# Patient Record
Sex: Male | Born: 1945 | Race: Black or African American | Hispanic: No | Smoking: Former smoker
Health system: Southern US, Community
[De-identification: ages and names within clinical notes are randomized; demographics above are authoritative.]

## PROBLEM LIST (undated history)

## (undated) DIAGNOSIS — E669 Obesity, unspecified: Secondary | ICD-10-CM

## (undated) DIAGNOSIS — I10 Essential (primary) hypertension: Secondary | ICD-10-CM

## (undated) DIAGNOSIS — G5690 Unspecified mononeuropathy of unspecified upper limb: Secondary | ICD-10-CM

## (undated) DIAGNOSIS — M1990 Unspecified osteoarthritis, unspecified site: Secondary | ICD-10-CM

## (undated) DIAGNOSIS — D126 Benign neoplasm of colon, unspecified: Secondary | ICD-10-CM

## (undated) DIAGNOSIS — L719 Rosacea, unspecified: Secondary | ICD-10-CM

## (undated) DIAGNOSIS — I493 Ventricular premature depolarization: Secondary | ICD-10-CM

## (undated) DIAGNOSIS — I428 Other cardiomyopathies: Secondary | ICD-10-CM

## (undated) DIAGNOSIS — I5022 Chronic systolic (congestive) heart failure: Secondary | ICD-10-CM

## (undated) DIAGNOSIS — Z9289 Personal history of other medical treatment: Secondary | ICD-10-CM

## (undated) DIAGNOSIS — E785 Hyperlipidemia, unspecified: Secondary | ICD-10-CM

## (undated) HISTORY — DX: Benign neoplasm of colon, unspecified: D12.6

## (undated) HISTORY — DX: Hyperlipidemia, unspecified: E78.5

## (undated) HISTORY — PX: COLONOSCOPY: SHX174

## (undated) HISTORY — DX: Chronic systolic (congestive) heart failure: I50.22

## (undated) HISTORY — DX: Ventricular premature depolarization: I49.3

## (undated) HISTORY — DX: Essential (primary) hypertension: I10

## (undated) HISTORY — DX: Obesity, unspecified: E66.9

## (undated) HISTORY — DX: Other cardiomyopathies: I42.8

## (undated) HISTORY — DX: Personal history of other medical treatment: Z92.89

## (undated) HISTORY — DX: Unspecified mononeuropathy of unspecified upper limb: G56.90

## (undated) HISTORY — DX: Rosacea, unspecified: L71.9

## (undated) HISTORY — PX: KNEE ARTHROSCOPY: SUR90

---

## 1965-11-28 HISTORY — PX: OTHER SURGICAL HISTORY: SHX169

## 2001-12-14 ENCOUNTER — Emergency Department (HOSPITAL_COMMUNITY): Admission: EM | Admit: 2001-12-14 | Discharge: 2001-12-14 | Payer: Self-pay

## 2010-05-11 ENCOUNTER — Encounter: Payer: Self-pay | Admitting: Cardiology

## 2010-05-17 ENCOUNTER — Ambulatory Visit (HOSPITAL_COMMUNITY): Admission: RE | Admit: 2010-05-17 | Discharge: 2010-05-17 | Payer: Self-pay | Admitting: Internal Medicine

## 2010-05-17 ENCOUNTER — Encounter (INDEPENDENT_AMBULATORY_CARE_PROVIDER_SITE_OTHER): Payer: Self-pay | Admitting: Internal Medicine

## 2010-05-17 ENCOUNTER — Ambulatory Visit: Payer: Self-pay | Admitting: Cardiology

## 2010-05-17 ENCOUNTER — Ambulatory Visit: Payer: Self-pay

## 2010-05-27 ENCOUNTER — Ambulatory Visit: Payer: Self-pay | Admitting: Cardiology

## 2010-05-27 DIAGNOSIS — I10 Essential (primary) hypertension: Secondary | ICD-10-CM | POA: Insufficient documentation

## 2010-05-27 DIAGNOSIS — I501 Left ventricular failure: Secondary | ICD-10-CM | POA: Insufficient documentation

## 2010-05-28 ENCOUNTER — Ambulatory Visit: Payer: Self-pay | Admitting: Cardiology

## 2010-06-02 LAB — CONVERTED CEMR LAB
BUN: 13 mg/dL (ref 6–23)
Basophils Absolute: 0 10*3/uL (ref 0.0–0.1)
Basophils Relative: 0.3 % (ref 0.0–3.0)
CO2: 29 meq/L (ref 19–32)
Calcium: 9.1 mg/dL (ref 8.4–10.5)
Chloride: 109 meq/L (ref 96–112)
Cholesterol: 219 mg/dL — ABNORMAL HIGH (ref 0–200)
Creatinine, Ser: 1.1 mg/dL (ref 0.4–1.5)
Direct LDL: 121.7 mg/dL
Eosinophils Absolute: 0.1 10*3/uL (ref 0.0–0.7)
Eosinophils Relative: 2.4 % (ref 0.0–5.0)
GFR calc non Af Amer: 91.11 mL/min (ref >60)
Glucose, Bld: 93 mg/dL (ref 70–99)
HCT: 44.9 % (ref 39.0–52.0)
HDL: 59.5 mg/dL (ref >39.00)
Hemoglobin: 15 g/dL (ref 13.0–17.0)
INR: 0.9 (ref 0.8–1.0)
Lymphocytes Relative: 55.7 % — ABNORMAL HIGH (ref 12.0–46.0)
Lymphs Abs: 3.3 10*3/uL (ref 0.7–4.0)
MCHC: 33.4 g/dL (ref 30.0–36.0)
MCV: 92.9 fL (ref 78.0–100.0)
Monocytes Absolute: 0.5 10*3/uL (ref 0.1–1.0)
Monocytes Relative: 8 % (ref 3.0–12.0)
Neutro Abs: 2 10*3/uL (ref 1.4–7.7)
Neutrophils Relative %: 33.6 % — ABNORMAL LOW (ref 43.0–77.0)
Platelets: 206 10*3/uL (ref 150.0–400.0)
Potassium: 4.3 meq/L (ref 3.5–5.1)
Prothrombin Time: 9.6 s — ABNORMAL LOW (ref 9.7–11.8)
RBC: 4.83 M/uL (ref 4.22–5.81)
RDW: 13 % (ref 11.5–14.6)
Sodium: 144 meq/L (ref 135–145)
Total CHOL/HDL Ratio: 4
Triglycerides: 187 mg/dL — ABNORMAL HIGH (ref 0.0–149.0)
VLDL: 37.4 mg/dL (ref 0.0–40.0)
WBC: 5.9 10*3/uL (ref 4.5–10.5)

## 2010-06-04 ENCOUNTER — Ambulatory Visit: Payer: Self-pay | Admitting: Cardiology

## 2010-06-04 ENCOUNTER — Inpatient Hospital Stay (HOSPITAL_BASED_OUTPATIENT_CLINIC_OR_DEPARTMENT_OTHER): Admission: RE | Admit: 2010-06-04 | Discharge: 2010-06-04 | Payer: Self-pay | Admitting: Cardiology

## 2010-06-14 ENCOUNTER — Ambulatory Visit: Payer: Self-pay | Admitting: Cardiology

## 2010-06-15 LAB — CONVERTED CEMR LAB
BUN: 18 mg/dL (ref 6–23)
CO2: 30 meq/L (ref 19–32)
Calcium: 9.1 mg/dL (ref 8.4–10.5)
Chloride: 110 meq/L (ref 96–112)
Creatinine, Ser: 1 mg/dL (ref 0.4–1.5)
GFR calc non Af Amer: 102.25 mL/min (ref >60)
Glucose, Bld: 128 mg/dL — ABNORMAL HIGH (ref 70–99)
Potassium: 4.4 meq/L (ref 3.5–5.1)
Pro B Natriuretic peptide (BNP): 85.9 pg/mL (ref 0.0–100.0)
Sodium: 143 meq/L (ref 135–145)

## 2010-06-16 ENCOUNTER — Ambulatory Visit: Payer: Self-pay | Admitting: Cardiology

## 2010-06-16 DIAGNOSIS — E785 Hyperlipidemia, unspecified: Secondary | ICD-10-CM | POA: Insufficient documentation

## 2010-06-17 ENCOUNTER — Telehealth: Payer: Self-pay | Admitting: Cardiology

## 2010-06-17 ENCOUNTER — Encounter: Payer: Self-pay | Admitting: Cardiology

## 2010-06-22 LAB — CONVERTED CEMR LAB
Albumin ELP: 56.3 % (ref 55.8–66.1)
Alpha-1-Globulin: 3.9 % (ref 2.9–4.9)
Alpha-2-Globulin: 7.9 % (ref 7.1–11.8)
Anti Nuclear Antibody(ANA): NEGATIVE
Beta Globulin: 6.1 % (ref 4.7–7.2)
Ferritin: 269 ng/mL (ref 22–322)
Gamma Globulin: 19.6 % — ABNORMAL HIGH (ref 11.1–18.8)
IgA: 306 mg/dL (ref 68–378)
IgG (Immunoglobin G), Serum: 1590 mg/dL (ref 694–1618)
IgM, Serum: 29 mg/dL — ABNORMAL LOW (ref 60–263)
Iron: 81 ug/dL (ref 42–165)
Saturation Ratios: 24 % (ref 20–55)
TIBC: 342 ug/dL (ref 215–435)
Total Protein, Serum Electrophoresis: 7.4 g/dL (ref 6.0–8.3)
UIBC: 261 ug/dL

## 2010-07-28 ENCOUNTER — Ambulatory Visit: Payer: Self-pay

## 2010-08-03 ENCOUNTER — Ambulatory Visit: Payer: Self-pay | Admitting: Cardiology

## 2010-08-05 LAB — CONVERTED CEMR LAB
ALT: 18 units/L (ref 0–53)
AST: 20 units/L (ref 0–37)
Albumin: 3.8 g/dL (ref 3.5–5.2)
Alkaline Phosphatase: 50 units/L (ref 39–117)
Bilirubin, Direct: 0.2 mg/dL (ref 0.0–0.3)
Cholesterol: 151 mg/dL (ref 0–200)
HDL: 52.2 mg/dL (ref >39.00)
LDL Cholesterol: 62 mg/dL (ref 0–99)
Total Bilirubin: 0.9 mg/dL (ref 0.3–1.2)
Total CHOL/HDL Ratio: 3
Total Protein: 6.8 g/dL (ref 6.0–8.3)
Triglycerides: 186 mg/dL — ABNORMAL HIGH (ref 0.0–149.0)
VLDL: 37.2 mg/dL (ref 0.0–40.0)

## 2010-08-09 LAB — CONVERTED CEMR LAB
ALT: 19 units/L (ref 0–53)
AST: 20 units/L (ref 0–37)
Albumin: 3.8 g/dL (ref 3.5–5.2)
Alkaline Phosphatase: 48 units/L (ref 39–117)
Bilirubin, Direct: 0.2 mg/dL (ref 0.0–0.3)
Cholesterol: 144 mg/dL (ref 0–200)
HDL: 48 mg/dL (ref >39.00)
LDL Cholesterol: 67 mg/dL (ref 0–99)
Total Bilirubin: 1.2 mg/dL (ref 0.3–1.2)
Total CHOL/HDL Ratio: 3
Total Protein: 6.8 g/dL (ref 6.0–8.3)
Triglycerides: 145 mg/dL (ref 0.0–149.0)
VLDL: 29 mg/dL (ref 0.0–40.0)

## 2010-08-17 ENCOUNTER — Ambulatory Visit: Payer: Self-pay | Admitting: Cardiology

## 2010-08-19 LAB — CONVERTED CEMR LAB
BUN: 14 mg/dL (ref 6–23)
CO2: 26 meq/L (ref 19–32)
Calcium: 9.8 mg/dL (ref 8.4–10.5)
Chloride: 106 meq/L (ref 96–112)
Creatinine, Ser: 1 mg/dL (ref 0.4–1.5)
GFR calc non Af Amer: 98.59 mL/min (ref >60)
Glucose, Bld: 83 mg/dL (ref 70–99)
Potassium: 4.1 meq/L (ref 3.5–5.1)
Sodium: 140 meq/L (ref 135–145)

## 2010-08-31 ENCOUNTER — Ambulatory Visit: Payer: Self-pay | Admitting: Cardiology

## 2010-09-06 LAB — CONVERTED CEMR LAB
BUN: 12 mg/dL (ref 6–23)
CO2: 29 meq/L (ref 19–32)
Calcium: 9.5 mg/dL (ref 8.4–10.5)
Chloride: 105 meq/L (ref 96–112)
Creatinine, Ser: 1.2 mg/dL (ref 0.4–1.5)
GFR calc non Af Amer: 79.57 mL/min (ref >60)
Glucose, Bld: 108 mg/dL — ABNORMAL HIGH (ref 70–99)
Potassium: 4 meq/L (ref 3.5–5.1)
Sodium: 141 meq/L (ref 135–145)

## 2010-11-10 ENCOUNTER — Encounter: Payer: Self-pay | Admitting: Cardiology

## 2010-11-10 ENCOUNTER — Ambulatory Visit: Payer: Self-pay | Admitting: Cardiology

## 2010-11-18 ENCOUNTER — Encounter (INDEPENDENT_AMBULATORY_CARE_PROVIDER_SITE_OTHER): Payer: Self-pay | Admitting: *Deleted

## 2010-12-16 ENCOUNTER — Ambulatory Visit: Admission: RE | Admit: 2010-12-16 | Discharge: 2010-12-16 | Payer: Self-pay | Source: Home / Self Care

## 2010-12-16 ENCOUNTER — Encounter: Payer: Self-pay | Admitting: Cardiology

## 2010-12-16 ENCOUNTER — Other Ambulatory Visit: Payer: Self-pay | Admitting: Cardiology

## 2010-12-16 ENCOUNTER — Ambulatory Visit (HOSPITAL_COMMUNITY)
Admission: RE | Admit: 2010-12-16 | Discharge: 2010-12-16 | Payer: Self-pay | Source: Home / Self Care | Attending: Cardiology | Admitting: Cardiology

## 2010-12-16 LAB — BASIC METABOLIC PANEL
BUN: 12 mg/dL (ref 6–23)
CO2: 30 mEq/L (ref 19–32)
Calcium: 9.2 mg/dL (ref 8.4–10.5)
Chloride: 101 mEq/L (ref 96–112)
Creatinine, Ser: 1.2 mg/dL (ref 0.4–1.5)
GFR: 79.49 mL/min (ref >60.00)
Glucose, Bld: 43 mg/dL — CL (ref 70–99)
Potassium: 3.9 mEq/L (ref 3.5–5.1)
Sodium: 136 mEq/L (ref 135–145)

## 2010-12-17 ENCOUNTER — Encounter (INDEPENDENT_AMBULATORY_CARE_PROVIDER_SITE_OTHER): Payer: Self-pay | Admitting: *Deleted

## 2010-12-21 ENCOUNTER — Ambulatory Visit
Admission: RE | Admit: 2010-12-21 | Discharge: 2010-12-21 | Payer: Self-pay | Source: Home / Self Care | Attending: Internal Medicine | Admitting: Internal Medicine

## 2010-12-28 NOTE — Letter (Signed)
Summary: [facility] New Patient Exam   [facility] New Patient Exam   Imported By: Roderic Ovens 06/03/2010 13:37:06  _____________________________________________________________________  External Attachment:    Type:   Image     Comment:   External Document

## 2010-12-28 NOTE — Progress Notes (Signed)
Summary: status of meds/pt called again  Phone Note Call from Patient Call back at Home Phone [phone]   Caller: Patient Reason for Call: Talk to Nurse Summary of Call: per pt calling checking on status of meds Initial call taken by: Lorne Skeens,  June 17, 2010 4:10 PM  Follow-up for Phone Call        pt called again regarding pills that he was perscribed yesterday you can reach him at [phone] Kevin Murillo  June 17, 2010 4:23 PM --talked with pt

## 2010-12-28 NOTE — Assessment & Plan Note (Signed)
Summary: np6/ HTN/ PT HAS AARP MEDICARE COMPLETE./GD   Primary Provider:  Dr. Timothy Lasso  CC:  new patient/HTN.  History of Present Illness: 65 yo with history of HTN presents for evaluation of abnormal ECG and abnormal echocardiogram.  Patient recently had a physical with his new PCP (Dr. Timothy Lasso).  He was noted to be hypertensive and started on medication.  He was also sent for an echo.  This was read as showing an EF of 45%.  I reviewed the echo myself today and think that that function is more like 30-35% with inferoposterior severe hypokinesis and moderate diastolic dysfunction.  Patient has never had chest pain or pressure.  He has no exertional dyspnea.  Really, no physical limitations.   Cardiac risk factors include HTN and age. Interestingly, oxygen saturation is on the low side today at 93% room air.    ECG:NSR, frequent PVCs, LAFB, LVH, lateral T wave inversions  Labs (6/11): K 3.9, TSH normal, creatinine 1.0  Current Medications (verified): 1)  Lisinopril 10 Mg Tabs (Lisinopril) .... Take One Tablet Once Daily 2)  Aspirin 81 Mg Tabs (Aspirin) .... Take One Tablet Once Daily  Allergies (verified): No Known Drug Allergies  Past History:  Past Medical History: 1.  HTN 2.  Left hand neuropathy 3.  Cardiomyopathy: Echo (6/11) with EF 30-35%, posteroinferior severe hypokinesis, moderate diastolic dysfunction, mild left atrial enlargement, mild LV hypertrophy.   Family History: Mother died with CHF at 54.   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  Quit smoking in 1996.  Occasional ETOH Married with 2 children.   Review of Systems       All systems reviewed and negative except as per HPI.   Vital Signs:  Patient profile:   65 year old male Height:      70 inches Weight:      238 pounds BMI:     34.27 O2 Sat:      93 % on Room air Pulse rate:   71 / minute Pulse rhythm:   irregular BP sitting:   132 / 78  (left  arm) Cuff size:   large  Vitals Entered By: Judithe Modest CMA (May 27, 2010 3:05 PM)  O2 Flow:  Room air                                                                                                                                              Physical Exam  General:  Well developed, well nourished, in no acute distress. Head:  normocephalic and atraumatic Nose:  no deformity, discharge, inflammation, or lesions Mouth:  Teeth, gums and palate normal. Oral mucosa normal. Neck:  Neck supple, no JVD. No masses, thyromegaly or abnormal cervical nodes. Lungs:  Clear bilaterally to auscultation and percussion. Heart:  Non-displaced PMI, chest non-tender; regular rate and  rhythm, S1, S2 without murmurs, rubs or gallops. Carotid upstroke normal, no bruit. Pedals normal pulses. No edema, no varicosities. Abdomen:  Bowel sounds positive; abdomen soft and non-tender without masses, organomegaly, or hernias noted. No hepatosplenomegaly. Msk:  Back normal, normal gait. Muscle strength and tone normal. Extremities:  No clubbing or cyanosis. Neurologic:  Alert and oriented x 3. Skin:  Intact without lesions or rashes. Psych:  Normal affect.   Impression & Recommendations:  Problem # 1:  LEFT HEART FAILURE (ICD-428.1) Patient has a cardiomyopathy with EF 30-35% of uncertain etiology.  He seems well-compensated at this time, euvolemic on exam and with really no significant symptoms.  The most likely cause for cardiomyopathy in this patient's demographic would be CAD.  We need to assess for CAD, especially as there is some regional variation in wall motion.  - Plan left heart cath to assess for CAD - Continue current dose of lisinopril. - Start Coreg 6.25 mg two times a day.  - Check fasting lipids.   Problem # 2:  UNSPECIFIED ESSENTIAL HYPERTENSION (ICD-401.9) BP is good today.   Other Orders: Cardiac Catheterization (Cardiac Cath)  Patient Instructions: 1)  Your physician has recommended  you make the following change in your medication:  2)  Start Coreg(carvedilol) 6.25mg  twice a day 3)  Your physician recommends that you return for a FASTING lipid profile/BMP/CBC/INR--428.1  401.9--YOU SHOULD HAVE THIS EITHER FRIDAY OR TUESDAY 4)  Your physician has requested that you have a cardiac catheterization.  Cardiac catheterization is used to diagnose and/or treat various heart conditions. Doctors may recommend this procedure for a number of different reasons. The most common reason is to evaluate chest pain. Chest pain can be a symptom of coronary artery disease (CAD), and cardiac catheterization can show whether plaque is narrowing or blocking your heart's arteries. This procedure is also used to evaluate the valves, as well as measure the blood flow and oxygen levels in different parts of your heart.  For further information please visit [email or web address]  Please follow instruction sheet, as given. 5)  FRIDAY June 04, 2010 6)  Your physician recommends that you schedule a follow-up appointment in: 2 weeks after the cath with Dr Shirlee Latch. Prescriptions: COREG 6.25 MG TABS (CARVEDILOL) one twice a day  #60 x 11   Entered by:   Katina Dung, RN, BSN   Authorized by:   Marca Ancona, MD   Signed by:   Katina Dung, RN, BSN on 05/27/2010   Method used:   [facility]  [id number]       [address]       [place], [place]  [address]       Ph: [phone] or [phone]       Fax: [phone]   RxID:   [id number]

## 2010-12-28 NOTE — Assessment & Plan Note (Signed)
Summary: per check out/sf   Visit Type:  Follow-up Primary Provider:  Dr. Timothy Lasso  CC:  no complaints pt coming along fine.  History of Present Illness: 65 yo with history of HTN and newly-found cardiomyopathy presents for cardiology followup.  Patient recently had a physical with his new PCP (Dr. Timothy Lasso).  He was noted to be hypertensive and started on medication.  He was also sent for an echo.  This was read as showing an EF of 45%.  I reviewed the echo myself and think that that function is more like 30-35% with inferoposterior severe hypokinesis and moderate diastolic dysfunction.  Patient has never had chest pain or pressure.  He has no exertional dyspnea.  Really, no physical limitations.   As the most common cause of depressed systolic function for his demographic by far would be CAD, I took him for left heart cath.  This showed no angiographic CAD.  EF was 30-35% by left ventriculogram.  He has a nonischemic cardiomyopathy.   Labs (6/11): K 3.9, TSH normal, creatinine 1.0 Labs (7/11): HDL 60, LDL 122, K 4.4, creatinine 1.0, BNP 86  Current Medications (verified): 1)  Lisinopril 10 Mg Tabs (Lisinopril) .... Take One Tablet Once Daily 2)  Aspirin 81 Mg Tabs (Aspirin) .... Take One Tablet Once Daily 3)  Coreg 6.25 Mg Tabs (Carvedilol) .... One Twice A Day 4)  Simvastatin 20 Mg Tabs (Simvastatin) .... One in The Evening 5)  Spironolactone 25 Mg Tabs (Spironolactone) .... Take One Half  Tablet By Mouth Daily  Allergies (verified): No Known Drug Allergies  Past History:  Past Medical History: 1.  HTN 2.  Left hand neuropathy 3.  Nonischemic cardiomyopathy: Echo (6/11) with EF 30-35%, posteroinferior severe hypokinesis, moderate diastolic dysfunction, mild left atrial enlargement, mild LV hypertrophy. LHC (7/11): EF 30-35% with diffuse hypokinesis, no angiographic CAD.    Family History: Reviewed history from 05/27/2010 and no changes required. Mother died with CHF at 69.   Social  History: Reviewed history from 05/27/2010 and no changes required. 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  Quit smoking in 1996.  Occasional ETOH Married with 2 children.   Review of Systems       All systems reviewed and negative except as per HPI.   Vital Signs:  Patient profile:   65 year old male Height:      70 inches Weight:      239 pounds BMI:     34.42 Pulse rate:   72 / minute Pulse rhythm:   regular BP sitting:   122 / 78  (left arm) Cuff size:   large  Vitals Entered By: Burnett Kanaris, CNA (June 16, 2010 8:32 AM)  Physical Exam  General:  Well developed, well nourished, in no acute distress. Obese.  Neck:  Neck supple, no JVD. No masses, thyromegaly or abnormal cervical nodes. Lungs:  Clear bilaterally to auscultation and percussion. Heart:  Non-displaced PMI, chest non-tender; regular rate and rhythm, S1, S2 without murmurs, rubs or gallops. Carotid upstroke normal, no bruit. Pedals normal pulses. No edema, no varicosities. Abdomen:  Bowel sounds positive; abdomen soft and non-tender without masses, organomegaly, or hernias noted. No hepatosplenomegaly. Extremities:  No clubbing or cyanosis. Neurologic:  Alert and oriented x 3. Psych:  Normal affect.   Impression & Recommendations:  Problem # 1:  LEFT HEART FAILURE (ICD-428.1) Patient has a nonischemic cardiomyopathy with EF 30-35%.  He seems well-compensated at this time, euvolemic on exam  and with really no significant symptoms. Cardiomyopathy could be due to prior myocarditis.  It also could be familial (mother with CHF at an early age).  He had a normal TSH.  I am going to check ANA, SPEP, Fe studies.  Eventually, he should get an HIV test for completeness.   - Increase spironolactone to 25 mg daily - Increase Coreg to 9.375 mg two times a day x 1 week, then to 12.5 mg two times a day.  - BMET in 2 wks - Followup in 2 months - Echo in 6 months for LV function on  medication.   Problem # 2:  UNSPECIFIED ESSENTIAL HYPERTENSION (ICD-401.9) BP is now controlled.   Problem # 3:  HYPERLIPIDEMIA-MIXED (ICD-272.4) On simvastatin.  Lipids/LFTs in 2 months.   Other Orders: T-Ferritin [id number]) T-Iron [id number]) T-Iron Binding Capacity (TIBC) ([id number]) T-Antinuclear Antib (ANA) [id number]) SPEP- [facility] ([id number]) Echocardiogram (Echo)  Patient Instructions: 1)  Your physician has recommended you make the following change in your medication:  2)  Increase Spironolactone to 25mg  daily. 3)  Increase Coreg(carvedilol) to 9.375mg  twice a day for 1 week--this will be one and one-half of a 6.25mg  tablet twice a day 4)  AFTER ONE WEEK increase Coreg(carvedilol) to 12.5mg  twice a day 5)  Your physician recommends that you have lab today--ANA/SPEP/Ferritin/Iron/TIBC 428.22 6)  Your physician recommends that you return for lab work in: 2 weeks--BMP 428.22 7)  Your physician recommends that you return for a FASTING lipid profile/liver profile in 2 months--428.22  8)  Your physician recommends that you schedule a follow-up appointment in: 2 months with Dr Clancy Gourd fasting lab a few days before the appointment with Dr Shirlee Latch. 9)  Your physician has requested that you have an echocardiogram.  Echocardiography is a painless test that uses sound waves to create images of your heart. It provides your doctor with information about the size and shape of your heart and how well your heart's chambers and valves are working.  This procedure takes approximately one hour. There are no restrictions for this procedure. IN 6 MONTHS Prescriptions: COREG 12.5 MG TABS (CARVEDILOL) one twice a day  #60 x 6   Entered by:   Katina Dung, RN, BSN   Authorized by:   Marca Ancona, MD   Signed by:   Katina Dung, RN, BSN on 06/16/2010   Method used:   [id number]       [address]       [place], [place]  [address]       Ph: [phone] or [phone]       Fax: [phone]   RxID:   [id number] SPIRONOLACTONE 25 MG TABS (SPIRONOLACTONE) one daily  #30 x 6   Entered by:   Katina Dung, RN, BSN   Authorized by:   Marca Ancona, MD   Signed by:   Katina Dung, RN, BSN on 06/16/2010   Method used:   [id number]       [address]       [place], [place]  [address]       Ph: [phone] or [phone]       Fax: [phone]   RxID:   [id number]

## 2010-12-28 NOTE — Assessment & Plan Note (Signed)
Summary: PER CHECK OUT/SF   Primary Provider:  Dr. Timothy Lasso   History of Present Illness: 65 yo with history of HTN and nonischemic cardiomyopathy presents for cardiology followup.  Since last appointment, he has done well with no complaints.  No exetional dyspnea, orthopnea, or PND.  No chest pain.  Weight is increased 3 lbs since last appointment.  He is not getting much exercise.    Labs (6/11): K 3.9, TSH normal, creatinine 1.0 Labs (7/11): HDL 60, LDL 122, K 4.4, creatinine 1.0, BNP 86, SPEP normal, transferrin saturation 24% Labs (9/11): LDL 67, HDL 48, TGs 145, LFTs normal  ECG: NSR, IVCD, left axis deviation  Current Medications (verified): 1)  Lisinopril 10 Mg Tabs (Lisinopril) .... Take One Tablet Once Daily 2)  Aspirin 81 Mg Tabs (Aspirin) .... Take One Tablet Once Daily 3)  Coreg 12.5 Mg Tabs (Carvedilol) .... One Twice A Day 4)  Simvastatin 20 Mg Tabs (Simvastatin) .... One in The Evening 5)  Spironolactone 25 Mg Tabs (Spironolactone) .... One Daily  Allergies (verified): No Known Drug Allergies  Past History:  Past Medical History: 1.  HTN 2.  Left hand neuropathy 3.  Nonischemic cardiomyopathy: Echo (6/11) with EF 30-35%, posteroinferior severe hypokinesis, moderate diastolic dysfunction, mild left atrial enlargement, mild LV hypertrophy. LHC (7/11): EF 30-35% with diffuse hypokinesis, no angiographic CAD.  SPEP and Fe studies unremarkable, TSH normal.  4.  Hyperlipidemia  Family History: Reviewed history from 05/27/2010 and no changes required. Mother died with CHF at 79.   Social History: Reviewed history from 05/27/2010 and no changes required. 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  Occasional ETOH Married with 2 children.   Review of Systems       All systems reviewed and negative except as per HPI.   Vital Signs:  Patient profile:   65 year old male Height:      70 inches Weight:       242 pounds BMI:     34.85 Pulse rate:   67 / minute Resp:     16 per minute BP sitting:   123 / 72  (right arm)  Vitals Entered By: Marrion Coy, CNA (August 17, 2010 8:37 AM)  Physical Exam  General:  Well developed, well nourished, in no acute distress. Obese.  Neck:  Neck supple, no JVD. No masses, thyromegaly or abnormal cervical nodes. Lungs:  Clear bilaterally to auscultation and percussion. Heart:  Non-displaced PMI, chest non-tender; regular rate and rhythm, S1, S2 without murmurs, rubs or gallops. Carotid upstroke normal, no bruit. Pedals normal pulses. No edema, no varicosities. Abdomen:  Bowel sounds positive; abdomen soft and non-tender without masses, organomegaly, or hernias noted. No hepatosplenomegaly. Extremities:  No clubbing or cyanosis. Neurologic:  Alert and oriented x 3. Psych:  Normal affect.   Impression & Recommendations:  Problem # 1:  LEFT HEART FAILURE (ICD-428.1) Patient has a nonischemic cardiomyopathy with EF 30-35%.  He seems well-compensated at this time, euvolemic on exam and with really no significant symptoms. Cardiomyopathy could be due to prior myocarditis.  It also could be familial (mother with CHF at an early age).  He had a normal TSH, SPEP, and Fe studies.  He has carries the diagnosis of hypertension, but it does not seem like his BP has been particularly high.     - Continue Coreg and spironolactone - Increase lisinopril to 10 mg two times a day with BMET in  2 hours.  - Echo in 1/11 on good medical regimen.  If EF < 35%, he will need an ICD.   Problem # 2:  HYPERLIPIDEMIA-MIXED (ICD-272.4) Excellent lipids when checked recently.   Other Orders: Echocardiogram (Echo) TLB-BMP (Basic Metabolic Panel-BMET) (80048-METABOL)  Patient Instructions: 1)  Your physician has recommended you make the following change in your medication:  2)  Increase Lisinopril to 10mg  twice a day. 3)  Your physician recommends that you have lab today--BMP  428.22 4)  Lab in 2 weeks---BMP 428.22 5)  Your physician recommends that you schedule a follow-up appointment in: 3 months with Dr Shirlee Latch. 6)  Your physician has requested that you have an echocardiogram.  Echocardiography is a painless test that uses sound waves to create images of your heart. It provides your doctor with information about the size and shape of your heart and how well your heart's chambers and valves are working.  This procedure takes approximately one hour. There are no restrictions for this procedure. JANUARY 2012 Prescriptions: LISINOPRIL 10 MG TABS (LISINOPRIL) take one tablet twice a day  #60 x 6   Entered by:   Katina Dung, RN, BSN   Authorized by:   Marca Ancona, MD   Signed by:   Katina Dung, RN, BSN on 08/17/2010   Method used:   [facility]  [address] [id number]       [address]       [place], [place]  [address]       Ph: [phone] or [phone]       Fax: [phone]   RxID:   [id number]

## 2010-12-28 NOTE — Letter (Signed)
Summary: Cardiac Catheterization Instructions- [facility]  [facility], Main Office  [address]   [place], [place] [address]   Phone: [phone]  Fax: [phone]     05/27/2010 MRN: [id number]  Kevin Murillo [address] [place], [place]  [address]  Dear Mr. [name], [name] are scheduled for a Cardiac Catheterization on Friday July 8,2011 with Dr.Yamili Lichtenwalner.  Please arrive to the 1st floor of the Heart and Vascular Center at [facility] at 6:30 am on the day of your procedure. Please do not arrive before 6:30 a.m. Call the Heart and Vascular Center at [phone] if you are unable to make your appointmnet. The Code to get into the parking garage under the building is 1000. Take the elevators to the 1st floor. You must have someone to drive you home. Someone must be with you for the first 24 hours after you arrive home. Please wear clothes that are easy to get on and off and wear slip-on shoes. Do not eat or drink after midnight except water with your medications that morning. Bring all your medications and current insurance cards with you.    __x_ Make sure you take your aspirin.  __x_ You may take ALL of your medications with water that morning.     The usual length of stay after your procedure is 2 to 3 hours. This can vary.  If you have any questions, please call the office at the number listed above.   Katina Dung, RN, BSN

## 2010-12-28 NOTE — Letter (Signed)
Summary: [facility] New Patient Exam Note   [facility] New Patient Exam Note   Imported By: Roderic Ovens 06/04/2010 10:34:04  _____________________________________________________________________  External Attachment:    Type:   Image     Comment:   External Document

## 2010-12-29 DIAGNOSIS — D126 Benign neoplasm of colon, unspecified: Secondary | ICD-10-CM

## 2010-12-29 HISTORY — DX: Benign neoplasm of colon, unspecified: D12.6

## 2010-12-30 NOTE — Miscellaneous (Signed)
Summary: [facility]  Clinical Lists Changes  Medications: Added new medication of MOVIPREP 100 GM  SOLR (PEG-KCL-NACL-NASULF-NA ASC-C) As per prep instructions. - Signed Rx of MOVIPREP 100 GM  SOLR (PEG-KCL-NACL-NASULF-NA ASC-C) As per prep instructions.;  #1 x 0;  Signed;  Entered by: Ezra Sites RN;  Authorized by: Iva Boop MD, FACG;  Method used: [facility]  [address] [id number] [address], [place], [place]  [address], Ph: [phone] or [phone], Fax: [phone] Observations: Added new observation of NKA: T (12/21/2010 9:42)    Prescriptions: MOVIPREP 100 GM  SOLR (PEG-KCL-NACL-NASULF-NA ASC-C) As per prep instructions.  #1 x 0   Entered by:   Ezra Sites RN   Authorized by:   Iva Boop MD, [facility]   Signed by:   Ezra Sites RN on 12/21/2010   Method used:   [facility]        [facility]  [address] [id number]       [address]       [place], [place]  [address]       Ph: [phone] or [phone]       Fax: [phone]   RxID:   [id number]

## 2010-12-30 NOTE — Letter (Signed)
Summary: Pre Visit Letter Revised  [facility]  [address] [place], [place] [address]   Phone: [phone]  Fax: [phone]        11/18/2010 MRN: [id number] Kevin Murillo [address] [place], [place]  [address]             Procedure Date: 01-04-11   Welcome to the Gastroenterology Division at [facility].    You are scheduled to see a nurse for your pre-procedure visit on 12-21-10 at 10:00a.m. on the 3rd floor at [facility], [address]  We ask that you try to arrive at our office 15 minutes prior to your appointment time to allow for check-in.  Please take a minute to review the attached form.  If you answer "Yes" to one or more of the questions on the first page, we ask that you call the person listed at your earliest opportunity.  If you answer "No" to all of the questions, please complete the rest of the form and bring it to your appointment.    Your nurse visit will consist of discussing your medical and surgical history, your immediate family medical history, and your medications.   If you are unable to list all of your medications on the form, please bring the medication bottles to your appointment and we will list them.  We will need to be aware of both prescribed and over the counter drugs.  We will need to know exact dosage information as well.    Please be prepared to read and sign documents such as consent forms, a financial agreement, and acknowledgement forms.  If necessary, and with your consent, a friend or relative is welcome to sit-in on the nurse visit with you.  Please bring your insurance card so that we may make a copy of it.  If your insurance requires a referral to see a specialist, please bring your referral form from your primary care physician.  No co-pay is required for this nurse visit.     If you cannot keep your appointment, please call [phone] to cancel or reschedule prior to your appointment date.  This  allows [place] the opportunity to schedule an appointment for another patient in need of care.    Thank you for choosing [facility] for your medical needs.  We appreciate the opportunity to care for you.  Please visit [place] at our website  to learn more about our practice.  Sincerely, The Gastroenterology Division

## 2010-12-30 NOTE — Assessment & Plan Note (Signed)
Summary: 3 month rov.sl   Visit Type:  Follow-up Primary Lysbeth Dicola:  Dr. Timothy Lasso  CC:  no compliants.  History of Present Illness: 65 yo with history of HTN and nonischemic cardiomyopathy presents for cardiology followup.  Since last appointment, he has done well with no complaints.  No exertional dyspnea, orthopnea, or PND.  No chest pain.  Weight is increased 8 lbs since last appointment.  He is not getting much exercise.  Blood pressure has been well-controlled at home when he checks.  Labs (6/11): K 3.9, TSH normal, creatinine 1.0 Labs (7/11): HDL 60, LDL 122, K 4.4, creatinine 1.0, BNP 86, SPEP normal, transferrin saturation 24% Labs (9/11): LDL 67, HDL 48, TGs 145, LFTs normal Labs (10/11): K 4, creatinine 1.2  ECG: NSR, IVCD, left axis deviation  Current Medications (verified): 1)  Lisinopril 10 Mg Tabs (Lisinopril) .... Take One Tablet Twice A Day 2)  Aspirin 81 Mg Tabs (Aspirin) .... Take One Tablet Once Daily 3)  Coreg 12.5 Mg Tabs (Carvedilol) .... One Twice A Day 4)  Simvastatin 20 Mg Tabs (Simvastatin) .... One in The Evening 5)  Spironolactone 25 Mg Tabs (Spironolactone) .... One Daily  Allergies (verified): No Known Drug Allergies  Past History:  Past Medical History: Reviewed history from 08/17/2010 and no changes required. 1.  HTN 2.  Left hand neuropathy 3.  Nonischemic cardiomyopathy: Echo (6/11) with EF 30-35%, posteroinferior severe hypokinesis, moderate diastolic dysfunction, mild left atrial enlargement, mild LV hypertrophy. LHC (7/11): EF 30-35% with diffuse hypokinesis, no angiographic CAD.  SPEP and Fe studies unremarkable, TSH normal.  4.  Hyperlipidemia  Family History: Reviewed history from 05/27/2010 and no changes required. Mother died with CHF at 65.   Social History: Reviewed history from 08/17/2010 and no changes required. 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  Occasional  ETOH Married with 2 children.   Vital Signs:  Patient profile:   65 year old male Height:      70 inches Weight:      250 pounds BMI:     36.00 Pulse rate:   64 / minute BP sitting:   128 / 78  (left arm) Cuff size:   large  Vitals Entered By: Hardin Negus, RMA (November 10, 2010 9:04 AM)  Physical Exam  General:  Well developed, well nourished, in no acute distress. Obese.  Neck:  Neck supple, no JVD. No masses, thyromegaly or abnormal cervical nodes. Lungs:  Clear bilaterally to auscultation and percussion. Heart:  Non-displaced PMI, chest non-tender; regular rate and rhythm, S1, S2 without murmurs, rubs or gallops. Carotid upstroke normal, no bruit. Pedals normal pulses. No edema, no varicosities. Abdomen:  Bowel sounds positive; abdomen soft and non-tender without masses, organomegaly, or hernias noted. No hepatosplenomegaly. Extremities:  No clubbing or cyanosis. Neurologic:  Alert and oriented x 3. Psych:  Normal affect.   Impression & Recommendations:  Problem # 1:  LEFT HEART FAILURE (ICD-428.1) Patient has a nonischemic cardiomyopathy with EF 30-35% by echo and left ventriculogram.  He seems well-compensated at this time, euvolemic on exam and with really no significant symptoms. Cardiomyopathy could be due to prior myocarditis.  It also could be familial (mother with CHF at an early age).  He had a normal TSH, SPEP, and Fe studies.  He has carries the diagnosis of hypertension, but it does not seem like his BP has been particularly high.     - Continue Coreg and  spironolactone, lisinopril at current doses.  BMET in 1/12.  - I am going to have him start Bidil 1/2 tab three times a day.   - Echo in 1/12 on good medical regimen.  If EF is still < 35%, he will need an ICD.   Problem # 2:  HYPERLIPIDEMIA-MIXED (ICD-272.4) Excellent lipids when last checked.   Other Orders: EKG w/ Interpretation (93000)  Patient Instructions: 1)  Your physician has recommended you make  the following change in your medication:  2)  Start BiDil 20/37.5mg  --take one-half tablet three times a day. 3)  Your physician recommends that you have lab on January 19,2012 when you have your echocardiogram. The echo is scheduled for 9:30am. 4)  Your physician wants you to follow-up in: 6 months with Dr Shirlee Latch.Joycie Peek 2012)  You will receive a reminder letter in the mail two months in advance. If you don't receive a letter, please call our office to schedule the follow-up appointment. Prescriptions: BIDIL 20-37.5 MG TABS (ISOSORB DINITRATE-HYDRALAZINE) one-half three times a day  #45 x 6   Entered by:   Katina Dung, RN, BSN   Authorized by:   Marca Ancona, MD   Signed by:   Katina Dung, RN, BSN on 11/10/2010   Method used:   Electronically to        [facility]  [id number] ([facility])       [address]       [place], [place]  [address]       Ph: [phone] or [phone]       Fax: [phone]   RxID:   [id number]

## 2010-12-30 NOTE — Letter (Signed)
Summary: [facility] Instructions  [facility]  [address] [place], [place] [address]   Phone: [phone]  Fax: [phone]       Kevin Murillo    1946-07-10    MRN: [id number]        Procedure Day /Date:  Tuesday 01/04/2011     Arrival Time: 8:30 am     Procedure Time: 9:30 am     Location of Procedure:                    _x _  [facility] (4th Floor)                        PREPARATION FOR COLONOSCOPY WITH MOVIPREP   Starting 5 days prior to your procedure Thursday 2/2 do not eat nuts, seeds, popcorn, corn, beans, peas,  salads, or any raw vegetables.  Do not take any fiber supplements (e.g. Metamucil, Citrucel, and Benefiber).  THE DAY BEFORE YOUR PROCEDURE         DATE: Monday 2/6  1.  Drink clear liquids the entire day-NO SOLID FOOD  2.  Do not drink anything colored red or purple.  Avoid juices with pulp.  No orange juice.  3.  Drink at least 64 oz. (8 glasses) of fluid/clear liquids during the day to prevent dehydration and help the prep work efficiently.  CLEAR LIQUIDS INCLUDE: Water Jello Ice Popsicles Tea (sugar ok, no milk/cream) Powdered fruit flavored drinks Coffee (sugar ok, no milk/cream) Gatorade Juice: apple, white grape, white cranberry  Lemonade Clear bullion, consomm, broth Carbonated beverages (any kind) Strained chicken noodle soup Hard Candy                             4.  In the morning, mix first dose of MoviPrep solution:    Empty 1 Pouch A and 1 Pouch B into the disposable container    Add lukewarm drinking water to the top line of the container. Mix to dissolve    Refrigerate (mixed solution should be used within 24 hrs)  5.  Begin drinking the prep at 5:00 p.m. The MoviPrep container is divided by 4 marks.   Every 15 minutes drink the solution down to the next mark (approximately 8 oz) until the full liter is complete.   6.  Follow completed prep with 16 oz of clear liquid of your choice  (Nothing red or purple).  Continue to drink clear liquids until bedtime.  7.  Before going to bed, mix second dose of MoviPrep solution:    Empty 1 Pouch A and 1 Pouch B into the disposable container    Add lukewarm drinking water to the top line of the container. Mix to dissolve    Refrigerate  THE DAY OF YOUR PROCEDURE      DATE: Tuesday 2/7  Beginning at 4:30 a.m. (5 hours before procedure):         1. Every 15 minutes, drink the solution down to the next mark (approx 8 oz) until the full liter is complete.  2. Follow completed prep with 16 oz. of clear liquid of your choice.    3. You may drink clear liquids until 7:30 am (2 HOURS BEFORE PROCEDURE).   MEDICATION INSTRUCTIONS  Unless otherwise instructed, you should take regular prescription medications with a small sip of water   as early as possible the morning of your  procedure.   Additional medication instructions: Do not take Spironolactone day of procedure.         OTHER INSTRUCTIONS  You will need a responsible adult at least 66 years of age to accompany you and drive you home.   This person must remain in the waiting room during your procedure.  Wear loose fitting clothing that is easily removed.  Leave jewelry and other valuables at home.  However, you may wish to bring a book to read or  an iPod/MP3 player to listen to music as you wait for your procedure to start.  Remove all body piercing jewelry and leave at home.  Total time from sign-in until discharge is approximately 2-3 hours.  You should go home directly after your procedure and rest.  You can resume normal activities the  day after your procedure.  The day of your procedure you should not:   Drive   Make legal decisions   Operate machinery   Drink alcohol   Return to work  You will receive specific instructions about eating, activities and medications before you leave.    The above instructions have been reviewed and explained  to me by   Ezra Sites RN  December 21, 2010 10:09 AM     I fully understand and can verbalize these instructions _____________________________ Date _________

## 2011-01-04 ENCOUNTER — Other Ambulatory Visit: Payer: Self-pay | Admitting: Internal Medicine

## 2011-01-04 ENCOUNTER — Other Ambulatory Visit (AMBULATORY_SURGERY_CENTER): Payer: MEDICARE | Admitting: Internal Medicine

## 2011-01-04 DIAGNOSIS — K5730 Diverticulosis of large intestine without perforation or abscess without bleeding: Secondary | ICD-10-CM

## 2011-01-04 DIAGNOSIS — D126 Benign neoplasm of colon, unspecified: Secondary | ICD-10-CM

## 2011-01-04 DIAGNOSIS — Z1211 Encounter for screening for malignant neoplasm of colon: Secondary | ICD-10-CM

## 2011-01-04 DIAGNOSIS — K648 Other hemorrhoids: Secondary | ICD-10-CM

## 2011-01-10 ENCOUNTER — Encounter: Payer: Self-pay | Admitting: Internal Medicine

## 2011-01-19 NOTE — Procedures (Signed)
Summary: Colonoscopy   Colonoscopy  Procedure date:  01/04/2011  Findings:      Location:  [facility].   COLONOSCOPY PROCEDURE REPORT  PATIENT:  Kevin Murillo, Kevin Murillo  MR#:  [id number] BIRTHDATE:   Jan 29, 1946, 64 yrs. old   GENDER:   male ENDOSCOPIST:   Iva Boop, MD, [facility] REF. BY: Creola Corn, M.D. PROCEDURE DATE:  01/04/2011 PROCEDURE:  Colonoscopy with biopsy and snare polypectomy ASA CLASS:   Class II INDICATIONS: Routine Risk Screening  MEDICATIONS:    Fentanyl 50 mcg, Versed 5 mg IV  DESCRIPTION OF PROCEDURE:   After the risks benefits and alternatives of the procedure were thoroughly explained, informed consent was obtained.  Digital rectal exam was performed and revealed no abnormalities and normal prostate.   The LB [id number] endoscope was introduced through the anus and advanced to the cecum, which was identified by both the appendix and ileocecal valve, without limitations.  The quality of the prep was excellent, using MoviPrep.  The instrument was then slowly withdrawn as the colon was fully examined. Insertion: 3:34 minutes Withdrawal: 16:01 minutes <<PROCEDUREIMAGES>>            <<OLD IMAGES>>    FINDINGS:  Four polyps were found throughout the colon. 1-2 mm ascending polyp (cold biopsy), 7-8 mm descending polyp (hot snare), 4mm sigmoid polyp (cold snare) and a 3 mm rectal polyp (cold biopsy). All removed and recovered.  Scattered diverticula were found throughout the colon.  This was otherwise a normal examination of the colon.   Retroflexed views in the rectum revealed internal hemorrhoids.    The scope was then withdrawn from the patient and the procedure completed.  COMPLICATIONS:   None ENDOSCOPIC IMPRESSION:  1) Four polyps throughout the colon - removed. Maximum size 7-8 mm, minimum  size 1-2 mm.  2) Diverticula, scattered throughout the colon  3) Internal hemorrhoids  4) Otherwise normal examination with excellent  prep RECOMMENDATIONS:  1) Hold aspirin, aspirin products, and anti-inflammatory medication for 1 week.  REPEAT EXAM:   In for Colonoscopy, pending biopsy results.    Iva Boop, MD, [facility]  CC: The Patient and Creola Corn, MD    Appended Document: Colonoscopy     Procedures Next Due Date:    Colonoscopy: 12/2013

## 2011-01-19 NOTE — Letter (Signed)
Summary: Patient Notice- Polyp Results   Gastroenterology  [address] [place], [place] [address]   Phone: [phone]  Fax: [phone]        January 10, 2011 MRN: [id number]    Kevin Murillo [address] [place], [place]  [address]    Dear [name],  The polyps removed from your colon were adenomatous. This means that they were pre-cancerous or that  they had the potential to change into cancer over time.   I recommend that you have a repeat colonoscopy in 3 years to determine if you have developed any new polyps over time and screen for colorectal cancer. If you develop any new rectal bleeding, abdominal pain or significant bowel habit changes, please contact us before then.  In addition to repeating colonoscopy, changing health habits may reduce your risk of having more colon or rectal  polyps and possibly, colorectal cancer. You may lower your risk of future polyps and colorectal cancer by adopting healthy habits such as not smoking or using tobacco (if you do), being physically active, losing weight (if overweight), and eating a diet which includes fruits and vegetables and limits red meat.  Please call us if you are having persistent problems or have questions about your condition that have not been fully answered at this time.  Sincerely,  Iva Boop MD, [facility]  This letter has been electronically signed by your physician.  Appended Document: Patient Notice- Polyp Results Letter mailed

## 2011-04-12 ENCOUNTER — Other Ambulatory Visit: Payer: Self-pay | Admitting: Cardiology

## 2011-07-15 ENCOUNTER — Other Ambulatory Visit: Payer: Self-pay | Admitting: *Deleted

## 2011-07-15 MED ORDER — SIMVASTATIN 20 MG PO TABS: 20.0000 mg | ORAL_TABLET | Freq: Every day | ORAL | Status: DC

## 2011-08-09 ENCOUNTER — Other Ambulatory Visit: Payer: Self-pay | Admitting: Cardiology

## 2011-08-10 ENCOUNTER — Other Ambulatory Visit: Payer: Self-pay | Admitting: Cardiology

## 2011-10-01 ENCOUNTER — Other Ambulatory Visit: Payer: Self-pay | Admitting: Cardiology

## 2011-11-12 ENCOUNTER — Other Ambulatory Visit: Payer: Self-pay | Admitting: Cardiology

## 2011-12-02 ENCOUNTER — Other Ambulatory Visit: Payer: Self-pay | Admitting: Cardiology

## 2012-01-08 ENCOUNTER — Other Ambulatory Visit: Payer: Self-pay | Admitting: Cardiology

## 2012-03-10 ENCOUNTER — Other Ambulatory Visit: Payer: Self-pay | Admitting: Cardiology

## 2012-03-12 NOTE — Telephone Encounter (Signed)
..   Requested Prescriptions   Pending Prescriptions Disp Refills  . simvastatin (ZOCOR) 20 MG tablet [Pharmacy Med Name: SIMVASTATIN 20 MG TABLET] 30 tablet 2    Sig: ONE IN THE EVENING  Patient needs appointment

## 2012-05-13 ENCOUNTER — Other Ambulatory Visit: Payer: Self-pay | Admitting: Cardiology

## 2012-06-17 ENCOUNTER — Other Ambulatory Visit: Payer: Self-pay | Admitting: Cardiology

## 2012-07-16 ENCOUNTER — Other Ambulatory Visit: Payer: Self-pay | Admitting: Cardiology

## 2012-07-25 ENCOUNTER — Other Ambulatory Visit: Payer: Self-pay | Admitting: Cardiology

## 2012-08-27 ENCOUNTER — Other Ambulatory Visit: Payer: Self-pay | Admitting: Cardiology

## 2012-11-04 ENCOUNTER — Encounter: Payer: Self-pay | Admitting: Internal Medicine

## 2012-11-04 DIAGNOSIS — Z860100 Personal history of colon polyps, unspecified: Secondary | ICD-10-CM | POA: Insufficient documentation

## 2012-11-24 ENCOUNTER — Other Ambulatory Visit: Payer: Self-pay | Admitting: Cardiology

## 2012-12-22 ENCOUNTER — Other Ambulatory Visit: Payer: Self-pay | Admitting: Cardiology

## 2012-12-26 ENCOUNTER — Other Ambulatory Visit: Payer: Self-pay

## 2012-12-26 MED ORDER — CARVEDILOL 12.5 MG PO TABS: 12.5000 mg | ORAL_TABLET | Freq: Two times a day (BID) | ORAL | Status: DC

## 2013-02-15 ENCOUNTER — Encounter: Payer: Self-pay | Admitting: Internal Medicine

## 2013-02-15 ENCOUNTER — Telehealth: Payer: Self-pay

## 2013-02-15 NOTE — Telephone Encounter (Signed)
Spoke to patient per Dr. Leone Payor and set up appointment on 03/12/13 for heme positive stool test at Dr. Margit Banda. Russo's office.  Paperwork mailed to patient.

## 2013-03-12 ENCOUNTER — Encounter: Payer: Self-pay | Admitting: Internal Medicine

## 2013-03-12 ENCOUNTER — Ambulatory Visit (INDEPENDENT_AMBULATORY_CARE_PROVIDER_SITE_OTHER): Payer: Medicare Other | Admitting: Internal Medicine

## 2013-03-12 VITALS — BP 130/80 | HR 71 | Ht 69.0 in | Wt 245.0 lb

## 2013-03-12 DIAGNOSIS — Z860100 Personal history of colon polyps, unspecified: Secondary | ICD-10-CM

## 2013-03-12 DIAGNOSIS — R195 Other fecal abnormalities: Secondary | ICD-10-CM

## 2013-03-12 MED ORDER — NA SULFATE-K SULFATE-MG SULF 17.5-3.13-1.6 GM/177ML PO SOLN: ORAL | Status: DC

## 2013-03-12 NOTE — Progress Notes (Signed)
  Subjective:    Patient ID: Kevin Murillo, male    DOB: 31-Jul-1946, 67 y.o.   MRN: [id number]  HPI Pleasant elderly man with hx 3 adenomas removed at colonoscopy 12/2010. No GI sxs but had iFOBT + in past few months. Guaiac testing on follow-up negative.  No Known Allergies Outpatient Prescriptions Prior to Visit  Medication Sig Dispense Refill  . carvedilol (COREG) 12.5 MG tablet Take 1 tablet (12.5 mg total) by mouth 2 (two) times daily.  60 tablet  0  . simvastatin (ZOCOR) 20 MG tablet Take 1 tablet (20 mg total) by mouth at bedtime.  30 tablet  2  . spironolactone (ALDACTONE) 25 MG tablet TAKE 1 TABLET EVERY DAY  30 tablet  5  . BIDIL 20-37.5 MG per tablet TAKE 1/2 TABLET BY MOUTH 3 TIMES A DAY  15 tablet  6  . lisinopril (PRINIVIL,ZESTRIL) 10 MG tablet TAKE ONE TABLET TWICE A DAY  20 tablet  0  . simvastatin (ZOCOR) 20 MG tablet TAKE 1 TABLET BY MOUTH EVERY EVENING  10 tablet  0   No facility-administered medications prior to visit.   Past Medical History  Diagnosis Date  . Tubular adenoma of colon 12/2010  . Hypertension   . Hyperlipidemia   . Neuropathy of hand   . Obesity   . Rosacea   . Non-ischemic cardiomyopathy   . PVC (premature ventricular contraction)   . CHF (congestive heart failure)    Past Surgical History  Procedure Laterality Date  . Knee arthroscopy Left   . Colonoscopy     Occupational History  . Retired Optometrist   . Drives Taxi    Social History Narrative   Married, 2 sons   Retired Insurance claims handler, referee   1-2 caffeine beverages daily   As of  03/12/2013         Review of Systems As per HPI, all other negative    Objective:   Physical Exam General:  NAD-obese Lungs:  clear Heart:  S1S2 no rubs, murmurs or gallops Abdomen:  soft and nontender, BS+, small umbilical hernia Data Reviewed:  PCP testing iFOBT + Guaiacs negative per patient 2012 colonoscopy     Assessment & Plan:  Heme + stool-iFOBT  Personal history of  colonic adenomas 1. Proceed to colonoscopy for heme + stool - could have new adenomas even carcinoma though unlikely. He was given option to wait until routine repeat due 12/2013 but decided to proceed now - that was my recommendation as well. 2. He will be in a colonoscopy surveillance program so will not need routine hemoccults  I appreciate the opportunity to care for this patient.

## 2013-03-12 NOTE — Patient Instructions (Addendum)
You have been scheduled for a colonoscopy with propofol. Please follow written instructions given to you at your visit today.  Please use  the prep kit you have been given today. If you use inhalers (even only as needed), please bring them with you on the day of your procedure.   Thank you for choosing me and [facility].  Carl E. Gessner, M.D., FACG  

## 2013-04-23 ENCOUNTER — Ambulatory Visit (AMBULATORY_SURGERY_CENTER): Payer: Medicare Other | Admitting: Internal Medicine

## 2013-04-23 ENCOUNTER — Encounter: Payer: Self-pay | Admitting: Internal Medicine

## 2013-04-23 VITALS — BP 129/81 | HR 57 | Temp 96.6°F | Resp 19 | Ht 69.0 in | Wt 245.0 lb

## 2013-04-23 DIAGNOSIS — R195 Other fecal abnormalities: Secondary | ICD-10-CM

## 2013-04-23 DIAGNOSIS — K648 Other hemorrhoids: Secondary | ICD-10-CM

## 2013-04-23 DIAGNOSIS — D126 Benign neoplasm of colon, unspecified: Secondary | ICD-10-CM

## 2013-04-23 DIAGNOSIS — K5730 Diverticulosis of large intestine without perforation or abscess without bleeding: Secondary | ICD-10-CM

## 2013-04-23 DIAGNOSIS — Z86010 Personal history of colonic polyps: Secondary | ICD-10-CM

## 2013-04-23 MED ORDER — SODIUM CHLORIDE 0.9 % IV SOLN: 500.0000 mL | INTRAVENOUS | Status: DC

## 2013-04-23 NOTE — Progress Notes (Signed)
Called to room to assist during endoscopic procedure.  Patient ID and intended procedure confirmed with present staff. Received instructions for my participation in the procedure from the performing physician.  

## 2013-04-23 NOTE — Progress Notes (Signed)
Patient did not experience any of the following events: a burn prior to discharge; a fall within the facility; wrong site/side/patient/procedure/implant event; or a hospital transfer or hospital admission upon discharge from the facility. (G8907) Patient did not have preoperative order for IV antibiotic SSI prophylaxis. (G8918)  

## 2013-04-23 NOTE — Patient Instructions (Addendum)
I found and removed 2 tiny polyps - they look benign.  You also have diverticulosis and hemorrhoids. I think the hemorrhoids caused the + test for blood in the stool.  I think I will be recommending a repeat colonoscopy for 5 years. I will let you know pathology results and when to have another routine colonoscopy by mail.  I recommend you do not do the stool tests for blood since you will have a routine colonoscopy in the future.  If the hemorrhoids bother you I am able to treat those with a simple in-office procedure that is pain-free. You may call to make an appointment if needed.  I appreciate the opportunity to care for you.  Iva Boop, MD, FACG  YOU HAD AN ENDOSCOPIC PROCEDURE TODAY AT THE [facility]: Refer to the procedure report that was given to you for any specific questions about what was found during the examination.  If the procedure report does not answer your questions, please call your gastroenterologist to clarify.  If you requested that your care partner not be given the details of your procedure findings, then the procedure report has been included in a sealed envelope for you to review at your convenience later.  YOU SHOULD EXPECT: Some feelings of bloating in the abdomen. Passage of more gas than usual.  Walking can help get rid of the air that was put into your GI tract during the procedure and reduce the bloating. If you had a lower endoscopy (such as a colonoscopy or flexible sigmoidoscopy) you may notice spotting of blood in your stool or on the toilet paper. If you underwent a bowel prep for your procedure, then you may not have a normal bowel movement for a few days.  DIET: Your first meal following the procedure should be a light meal and then it is ok to progress to your normal diet.  A half-sandwich or bowl of soup is an example of a good first meal.  Heavy or fried foods are harder to digest and may make you feel nauseous or bloated.  Likewise  meals heavy in dairy and vegetables can cause extra gas to form and this can also increase the bloating.  Drink plenty of fluids but you should avoid alcoholic beverages for 24 hours.  ACTIVITY: Your care partner should take you home directly after the procedure.  You should plan to take it easy, moving slowly for the rest of the day.  You can resume normal activity the day after the procedure however you should NOT DRIVE or use heavy machinery for 24 hours (because of the sedation medicines used during the test).    SYMPTOMS TO REPORT IMMEDIATELY: A gastroenterologist can be reached at any hour.  During normal business hours, 8:30 AM to 5:00 PM Monday through Friday, call [phone].  After hours and on weekends, please call the GI answering service at [phone] who will take a message and have the physician on call contact you.   Following lower endoscopy (colonoscopy or flexible sigmoidoscopy):  Excessive amounts of blood in the stool  Significant tenderness or worsening of abdominal pains  Swelling of the abdomen that is new, acute  Fever of 100F or higher \  FOLLOW UP: If any biopsies were taken you will be contacted by phone or by letter within the next 1-3 weeks.  Call your gastroenterologist if you have not heard about the biopsies in 3 weeks.  Our staff will call the home number listed on  your records the next business day following your procedure to check on you and address any questions or concerns that you may have at that time regarding the information given to you following your procedure. This is a courtesy call and so if there is no answer at the home number and we have not heard from you through the emergency physician on call, we will assume that you have returned to your regular daily activities without incident.  SIGNATURES/CONFIDENTIALITY: You and/or your care partner have signed paperwork which will be entered into your electronic medical record.  These signatures  attest to the fact that that the information above on your After Visit Summary has been reviewed and is understood.  Full responsibility of the confidentiality of this discharge information lies with you and/or your care-partner.   Polyp, diverticulosis, hemorrhoid

## 2013-04-23 NOTE — Progress Notes (Signed)
NO EGG OR SOY ALLERGY. EWM 

## 2013-04-23 NOTE — Op Note (Signed)
[facility] [address] [place], [address]   COLONOSCOPY PROCEDURE REPORT  PATIENT: Kevin Murillo, Kevin Murillo  MR#: [id number] BIRTHDATE: 11/16/1946 , 67  yrs. old GENDER: Male ENDOSCOPIST: Iva Boop, MD, [facility] PROCEDURE DATE:  04/23/2013 PROCEDURE:   Colonoscopy with biopsy ASA CLASS:   Class III INDICATIONS:heme-positive stool.   (prior colonoscopy 2012 - 3 adenomas max 8 mm) MEDICATIONS: Propofol (Diprivan) 240 mg IV, MAC sedation, administered by CRNA, and These medications were titrated to patient response per physician's verbal order  DESCRIPTION OF PROCEDURE:   After the risks benefits and alternatives of the procedure were thoroughly explained, informed consent was obtained.  A digital rectal exam revealed no abnormalities of the rectum, A digital rectal exam revealed no prostatic nodules, and A digital rectal exam revealed the prostate was not enlarged.   The LB [id number]  endoscope was introduced through the anus and advanced to the cecum, which was identified by both the appendix and ileocecal valve. No adverse events experienced.   The quality of the prep was excellent using Suprep  The instrument was then slowly withdrawn as the colon was fully examined.    COLON FINDINGS: Two diminutive sessile polyps were found at the splenic flexure and in the rectum.  A polypectomy was performed with cold forceps.  The resection was complete and the polyp tissue was completely retrieved.   Mild diverticulosis was noted in the sigmoid colon.   The colon mucosa was otherwise normal.   A right colon retroflexion was performed.  Retroflexed rectal views revealed internal hemorrhoids. The time to cecum=2 minutes 20 seconds.  Withdrawal time=8 minutes 21 seconds.  The scope was withdrawn and the procedure completed. COMPLICATIONS: There were no complications.  ENDOSCOPIC IMPRESSION: 1.   Two diminutive sessile polyps were found at the splenic  flexure and in the rectum; polypectomy was performed with cold forceps 2.   Mild diverticulosis was noted in the sigmoid colon 3.   The colon mucosa was otherwise normal - excellent prep 4.    Internal hemorrhoids seen in rectum - most likely cause of heme + stool  RECOMMENDATIONS: 1.  Timing of repeat colonoscopy will be determined by pathology findings. 2.   Avoid routine hemoccults as he is enrolled in screening/surveillance colonoscopy program (3 adenomas remopved 2012)   eSigned:  Iva Boop, MD, [facility] 04/23/2013 11:26 AM   cc: Creola Corn, MD and The Patient

## 2013-04-24 ENCOUNTER — Telehealth: Payer: Self-pay

## 2013-04-24 NOTE — Telephone Encounter (Signed)
  Follow up Call-  Call back number 04/23/2013  Post procedure Call Back phone  # [phone]  Permission to leave phone message Yes     Patient questions:  Do you have a fever, pain , or abdominal swelling? no Pain Score  0 *  Have you tolerated food without any problems? yes  Have you been able to return to your normal activities? yes  Do you have any questions about your discharge instructions: Diet   no Medications  no Follow up visit  no  Do you have questions or concerns about your Care? no  Actions: * If pain score is 4 or above: No action needed, pain <4.      Per the pt, "everything went great, no problems.  It felt like a regular day". Maw

## 2013-04-29 ENCOUNTER — Encounter: Payer: Self-pay | Admitting: Internal Medicine

## 2013-04-29 NOTE — Progress Notes (Signed)
Quick Note:  One tubular adenoma 2 hyperplastic polyps Repeat colon about 2019 ______

## 2013-04-29 NOTE — Progress Notes (Signed)
Quick Note:  Correction: Only 1 hyperplastic polyp ______

## 2013-05-07 ENCOUNTER — Encounter: Payer: Self-pay | Admitting: Cardiology

## 2013-05-07 ENCOUNTER — Ambulatory Visit (INDEPENDENT_AMBULATORY_CARE_PROVIDER_SITE_OTHER): Payer: Medicare Other | Admitting: Cardiology

## 2013-05-07 VITALS — BP 126/80 | HR 71 | Ht 69.0 in | Wt 244.0 lb

## 2013-05-07 DIAGNOSIS — E785 Hyperlipidemia, unspecified: Secondary | ICD-10-CM

## 2013-05-07 DIAGNOSIS — I5022 Chronic systolic (congestive) heart failure: Secondary | ICD-10-CM

## 2013-05-07 DIAGNOSIS — I420 Dilated cardiomyopathy: Secondary | ICD-10-CM | POA: Insufficient documentation

## 2013-05-07 DIAGNOSIS — I10 Essential (primary) hypertension: Secondary | ICD-10-CM

## 2013-05-07 DIAGNOSIS — I428 Other cardiomyopathies: Secondary | ICD-10-CM

## 2013-05-07 MED ORDER — LISINOPRIL 10 MG PO TABS: 10.0000 mg | ORAL_TABLET | Freq: Every day | ORAL | Status: DC

## 2013-05-07 NOTE — Patient Instructions (Addendum)
Start lisinopril 10mg  daily.   Your physician recommends that you return for lab work in: 2 weeks--BMET.  Your physician has requested that you have an echocardiogram. Echocardiography is a painless test that uses sound waves to create images of your heart. It provides your doctor with information about the size and shape of your heart and how well your heart's chambers and valves are working. This procedure takes approximately one hour. There are no restrictions for this procedure.  Your physician wants you to follow-up in: 6 months with Dr Shirlee Latch. (December 2014).  You will receive a reminder letter in the mail two months in advance. If you don't receive a letter, please call our office to schedule the follow-up appointment.
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
## 2013-05-21 ENCOUNTER — Other Ambulatory Visit (INDEPENDENT_AMBULATORY_CARE_PROVIDER_SITE_OTHER): Payer: Medicare Other

## 2013-05-21 ENCOUNTER — Ambulatory Visit (HOSPITAL_COMMUNITY): Payer: Medicare Other | Attending: Cardiology | Admitting: Radiology

## 2013-05-21 DIAGNOSIS — E785 Hyperlipidemia, unspecified: Secondary | ICD-10-CM | POA: Insufficient documentation

## 2013-05-21 DIAGNOSIS — E669 Obesity, unspecified: Secondary | ICD-10-CM | POA: Insufficient documentation

## 2013-05-21 DIAGNOSIS — Z87891 Personal history of nicotine dependence: Secondary | ICD-10-CM | POA: Insufficient documentation

## 2013-05-21 DIAGNOSIS — I509 Heart failure, unspecified: Secondary | ICD-10-CM

## 2013-05-21 DIAGNOSIS — I5022 Chronic systolic (congestive) heart failure: Secondary | ICD-10-CM

## 2013-05-21 DIAGNOSIS — I428 Other cardiomyopathies: Secondary | ICD-10-CM | POA: Insufficient documentation

## 2013-05-21 DIAGNOSIS — I10 Essential (primary) hypertension: Secondary | ICD-10-CM | POA: Insufficient documentation

## 2013-05-21 LAB — BASIC METABOLIC PANEL
Calcium: 9.4 mg/dL (ref 8.4–10.5)
Creatinine, Ser: 1.2 mg/dL (ref 0.4–1.5)
GFR: 81.29 mL/min (ref >60.00)
Sodium: 139 mEq/L (ref 135–145)

## 2013-05-21 NOTE — Progress Notes (Signed)
Echocardiogram performed.  

## 2013-05-22 ENCOUNTER — Other Ambulatory Visit: Payer: Self-pay | Admitting: *Deleted

## 2013-05-22 DIAGNOSIS — I5022 Chronic systolic (congestive) heart failure: Secondary | ICD-10-CM

## 2013-05-22 MED ORDER — LISINOPRIL 20 MG PO TABS: 20.0000 mg | ORAL_TABLET | Freq: Every day | ORAL | Status: DC

## 2013-06-05 ENCOUNTER — Other Ambulatory Visit (INDEPENDENT_AMBULATORY_CARE_PROVIDER_SITE_OTHER): Payer: Medicare Other

## 2013-06-05 DIAGNOSIS — I5022 Chronic systolic (congestive) heart failure: Secondary | ICD-10-CM

## 2013-06-05 LAB — BASIC METABOLIC PANEL
BUN: 14 mg/dL (ref 6–23)
CO2: 30 mEq/L (ref 19–32)
Calcium: 9.5 mg/dL (ref 8.4–10.5)
Creatinine, Ser: 1.1 mg/dL (ref 0.4–1.5)
GFR: 90.28 mL/min (ref >60.00)
Glucose, Bld: 98 mg/dL (ref 70–99)

## 2013-06-11 ENCOUNTER — Other Ambulatory Visit: Payer: Self-pay | Admitting: Cardiology

## 2013-07-16 ENCOUNTER — Other Ambulatory Visit: Payer: Self-pay | Admitting: Cardiology

## 2013-08-15 ENCOUNTER — Encounter (HOSPITAL_COMMUNITY): Payer: Self-pay | Admitting: *Deleted

## 2013-08-15 ENCOUNTER — Emergency Department (HOSPITAL_COMMUNITY)
Admission: EM | Admit: 2013-08-15 | Discharge: 2013-08-15 | Disposition: A | Discharge status: Home / Self Care | Payer: Medicare Other | Attending: Emergency Medicine | Admitting: Emergency Medicine

## 2013-08-15 ENCOUNTER — Emergency Department (HOSPITAL_COMMUNITY): Payer: Medicare Other

## 2013-08-15 DIAGNOSIS — I509 Heart failure, unspecified: Secondary | ICD-10-CM | POA: Insufficient documentation

## 2013-08-15 DIAGNOSIS — Y9389 Activity, other specified: Secondary | ICD-10-CM | POA: Insufficient documentation

## 2013-08-15 DIAGNOSIS — G5690 Unspecified mononeuropathy of unspecified upper limb: Secondary | ICD-10-CM | POA: Insufficient documentation

## 2013-08-15 DIAGNOSIS — L719 Rosacea, unspecified: Secondary | ICD-10-CM | POA: Insufficient documentation

## 2013-08-15 DIAGNOSIS — M25559 Pain in unspecified hip: Secondary | ICD-10-CM | POA: Insufficient documentation

## 2013-08-15 DIAGNOSIS — I428 Other cardiomyopathies: Secondary | ICD-10-CM | POA: Insufficient documentation

## 2013-08-15 DIAGNOSIS — Z79899 Other long term (current) drug therapy: Secondary | ICD-10-CM | POA: Insufficient documentation

## 2013-08-15 DIAGNOSIS — Z87891 Personal history of nicotine dependence: Secondary | ICD-10-CM | POA: Insufficient documentation

## 2013-08-15 DIAGNOSIS — E785 Hyperlipidemia, unspecified: Secondary | ICD-10-CM | POA: Insufficient documentation

## 2013-08-15 DIAGNOSIS — Z7982 Long term (current) use of aspirin: Secondary | ICD-10-CM | POA: Insufficient documentation

## 2013-08-15 DIAGNOSIS — M545 Low back pain, unspecified: Secondary | ICD-10-CM

## 2013-08-15 DIAGNOSIS — Y92410 Unspecified street and highway as the place of occurrence of the external cause: Secondary | ICD-10-CM | POA: Insufficient documentation

## 2013-08-15 DIAGNOSIS — I4949 Other premature depolarization: Secondary | ICD-10-CM | POA: Insufficient documentation

## 2013-08-15 DIAGNOSIS — I10 Essential (primary) hypertension: Secondary | ICD-10-CM | POA: Insufficient documentation

## 2013-08-15 DIAGNOSIS — N200 Calculus of kidney: Secondary | ICD-10-CM

## 2013-08-15 DIAGNOSIS — E669 Obesity, unspecified: Secondary | ICD-10-CM | POA: Insufficient documentation
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
## 2013-08-16 NOTE — ED Provider Notes (Signed)
Medical screening examination/treatment/procedure(s) were performed by non-physician practitioner and as supervising physician I was immediately available for consultation/collaboration.  Shon Baton, MD 08/16/13 [phone]

## 2013-12-05 ENCOUNTER — Ambulatory Visit: Payer: Medicare Other | Attending: Orthopaedic Surgery | Admitting: Physical Therapy

## 2013-12-05 DIAGNOSIS — R269 Unspecified abnormalities of gait and mobility: Secondary | ICD-10-CM | POA: Insufficient documentation

## 2013-12-05 DIAGNOSIS — M169 Osteoarthritis of hip, unspecified: Secondary | ICD-10-CM | POA: Insufficient documentation

## 2013-12-05 DIAGNOSIS — M1610 Unilateral primary osteoarthritis, unspecified hip: Secondary | ICD-10-CM | POA: Insufficient documentation

## 2013-12-05 DIAGNOSIS — M25559 Pain in unspecified hip: Secondary | ICD-10-CM | POA: Insufficient documentation

## 2013-12-05 DIAGNOSIS — IMO0001 Reserved for inherently not codable concepts without codable children: Secondary | ICD-10-CM | POA: Insufficient documentation

## 2013-12-09 ENCOUNTER — Ambulatory Visit (INDEPENDENT_AMBULATORY_CARE_PROVIDER_SITE_OTHER): Payer: Medicare Other | Admitting: Cardiology

## 2013-12-09 ENCOUNTER — Encounter: Payer: Self-pay | Admitting: Cardiology

## 2013-12-09 ENCOUNTER — Encounter: Payer: Self-pay | Admitting: *Deleted

## 2013-12-09 VITALS — BP 100/68 | HR 67 | Ht 70.0 in | Wt 245.0 lb

## 2013-12-09 DIAGNOSIS — I428 Other cardiomyopathies: Secondary | ICD-10-CM

## 2013-12-09 DIAGNOSIS — I10 Essential (primary) hypertension: Secondary | ICD-10-CM

## 2013-12-09 DIAGNOSIS — I5022 Chronic systolic (congestive) heart failure: Secondary | ICD-10-CM

## 2013-12-09 NOTE — Patient Instructions (Signed)
Your physician recommends that you have  lab work today--BMET/BNP.  Your physician has requested that you have an echocardiogram. Echocardiography is a painless test that uses sound waves to create images of your heart. It provides your doctor with information about the size and shape of your heart and how well your heart's chambers and valves are working. This procedure takes approximately one hour. There are no restrictions for this procedure.  Your physician wants you to follow-up in: 4 months with Dr Aundra Dubin. (May 2015).  You will receive a reminder letter in the mail two months in advance. If you don't receive a letter, please call our office to schedule the follow-up appointment.

## 2013-12-10 ENCOUNTER — Ambulatory Visit: Payer: Medicare Other | Admitting: Physical Therapy

## 2013-12-10 LAB — BASIC METABOLIC PANEL
BUN: 20 mg/dL (ref 6–23)
CHLORIDE: 104 meq/L (ref 96–112)
CO2: 29 meq/L (ref 19–32)
Calcium: 9.3 mg/dL (ref 8.4–10.5)
Creatinine, Ser: 1 mg/dL (ref 0.4–1.5)
GFR: 91.14 mL/min (ref >60.00)
GLUCOSE: 78 mg/dL (ref 70–99)
POTASSIUM: 4.2 meq/L (ref 3.5–5.1)
SODIUM: 138 meq/L (ref 135–145)

## 2013-12-10 LAB — BRAIN NATRIURETIC PEPTIDE: Pro B Natriuretic peptide (BNP): 51 pg/mL (ref 0.0–100.0)
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
## 2013-12-12 ENCOUNTER — Ambulatory Visit: Payer: Medicare Other | Admitting: Physical Therapy

## 2013-12-16 ENCOUNTER — Ambulatory Visit: Payer: Medicare Other | Admitting: Physical Therapy

## 2013-12-16 ENCOUNTER — Other Ambulatory Visit: Payer: Self-pay | Admitting: Cardiology

## 2013-12-18 ENCOUNTER — Ambulatory Visit: Payer: Medicare Other | Admitting: Physical Therapy

## 2013-12-23 ENCOUNTER — Ambulatory Visit: Payer: Medicare Other | Admitting: Physical Therapy

## 2013-12-25 ENCOUNTER — Ambulatory Visit: Payer: Medicare Other | Admitting: Physical Therapy

## 2013-12-26 ENCOUNTER — Ambulatory Visit (HOSPITAL_COMMUNITY): Payer: Medicare Other | Attending: Cardiology | Admitting: Radiology

## 2013-12-26 ENCOUNTER — Encounter: Payer: Self-pay | Admitting: Cardiology

## 2013-12-26 DIAGNOSIS — I10 Essential (primary) hypertension: Secondary | ICD-10-CM | POA: Insufficient documentation

## 2013-12-26 DIAGNOSIS — Z87891 Personal history of nicotine dependence: Secondary | ICD-10-CM | POA: Insufficient documentation

## 2013-12-26 DIAGNOSIS — E785 Hyperlipidemia, unspecified: Secondary | ICD-10-CM | POA: Insufficient documentation

## 2013-12-26 DIAGNOSIS — I428 Other cardiomyopathies: Secondary | ICD-10-CM | POA: Insufficient documentation

## 2013-12-26 DIAGNOSIS — I517 Cardiomegaly: Secondary | ICD-10-CM | POA: Insufficient documentation

## 2013-12-26 DIAGNOSIS — I253 Aneurysm of heart: Secondary | ICD-10-CM | POA: Insufficient documentation

## 2013-12-26 DIAGNOSIS — I5022 Chronic systolic (congestive) heart failure: Secondary | ICD-10-CM

## 2013-12-26 DIAGNOSIS — E669 Obesity, unspecified: Secondary | ICD-10-CM | POA: Insufficient documentation

## 2013-12-26 NOTE — Progress Notes (Signed)
Echocardiogram performed.  

## 2013-12-30 ENCOUNTER — Ambulatory Visit: Payer: Medicare Other | Attending: Orthopaedic Surgery | Admitting: Physical Therapy

## 2013-12-30 DIAGNOSIS — M169 Osteoarthritis of hip, unspecified: Secondary | ICD-10-CM | POA: Insufficient documentation

## 2013-12-30 DIAGNOSIS — R269 Unspecified abnormalities of gait and mobility: Secondary | ICD-10-CM | POA: Insufficient documentation

## 2013-12-30 DIAGNOSIS — IMO0001 Reserved for inherently not codable concepts without codable children: Secondary | ICD-10-CM | POA: Insufficient documentation

## 2013-12-30 DIAGNOSIS — M25559 Pain in unspecified hip: Secondary | ICD-10-CM | POA: Insufficient documentation

## 2013-12-30 DIAGNOSIS — M1610 Unilateral primary osteoarthritis, unspecified hip: Secondary | ICD-10-CM | POA: Insufficient documentation

## 2014-01-01 ENCOUNTER — Ambulatory Visit: Payer: Medicare Other | Admitting: Physical Therapy

## 2014-01-13 ENCOUNTER — Encounter: Payer: Self-pay | Admitting: Internal Medicine

## 2014-02-05 ENCOUNTER — Encounter: Payer: Self-pay | Admitting: Cardiology

## 2014-02-05 ENCOUNTER — Ambulatory Visit (INDEPENDENT_AMBULATORY_CARE_PROVIDER_SITE_OTHER): Payer: Medicare Other | Admitting: Cardiology

## 2014-02-05 VITALS — BP 122/64 | HR 60 | Ht 70.0 in | Wt 243.0 lb

## 2014-02-05 DIAGNOSIS — I501 Left ventricular failure: Secondary | ICD-10-CM

## 2014-02-05 DIAGNOSIS — I428 Other cardiomyopathies: Secondary | ICD-10-CM

## 2014-02-05 DIAGNOSIS — I10 Essential (primary) hypertension: Secondary | ICD-10-CM

## 2014-02-05 MED ORDER — LISINOPRIL 20 MG PO TABS: 20.0000 mg | ORAL_TABLET | Freq: Every day | ORAL | Status: DC

## 2014-02-05 NOTE — Patient Instructions (Addendum)
Your physician has recommended you make the following change in your medication: 1. Increase Lisinopril to 20 MG 1 tablet daily  Your physician has recommended that you have a cardiopulmonary stress test (CPX). CPX testing is a non-invasive measurement of heart and lung function. It replaces a traditional treadmill stress test. This type of test provides a tremendous amount of information that relates not only to your present condition but also for future outcomes. This test combines measurements of you ventilation, respiratory gas exchange in the lungs, electrocardiogram (EKG), blood pressure and physical response before, during, and following an exercise protocol. (schedule at [facility])   Your physician recommends that you return for lab work in: Two weeks on 02/19/14 for a BMET  Your physician recommends that you schedule a follow-up appointment in: 4 Months with Dr Aundra Dubin

## 2014-02-06 NOTE — Progress Notes (Signed)
Patient ID: Kevin Murillo, male   DOB: 02-17-1946, 68 y.o.   MRN: [id number] PCP: Dr. Virgina Jock  68 yo with history of HTN and nonischemic cardiomyopathy presents for cardiology followup.  He had an echo in 1/15, showing that EF remains 25-30% with mild LV dilation.  He does not get much exercise.  He denies exertional dyspnea or chest pain.  He has not orthopnea, bendopnea, or PND.  No syncope or presyncope.  He can climb a flight of steps without problems.  Weight is down 2 lbs.  Labs (6/11): K 3.9, TSH normal, creatinine 1.0  Labs (7/11): HDL 60, LDL 122, K 4.4, creatinine 1.0, BNP 86, SPEP normal, transferrin saturation 24%  Labs (9/11): LDL 67, HDL 48, TGs 145, LFTs normal  Labs (10/11): K 4, creatinine 1.2  Labs (5/13): K 3.9, creatinine 1.0, LDL 66, HDL 44 Labs (7/14): K 3.9, creatinine 1.1  Allergies (verified):  No Known Drug Allergies   Past Medical History:  1. HTN  2. Left hand neuropathy  3. Nonischemic cardiomyopathy: Echo (6/11) with EF 30-35%, posteroinferior severe hypokinesis, moderate diastolic dysfunction, mild left atrial enlargement, mild LV hypertrophy. LHC (7/11): EF 30-35% with diffuse hypokinesis, no angiographic CAD. SPEP and Fe studies unremarkable, TSH normal.  Repeat echo (1/12) with EF 45%, mild global hypokinesis, mild LVH, normal RV size and systolic function.  Echo (6/14) with EF 20-25%, diffuse hypokinesis, mild LV dilation. Echo (1/15) with mild LV dilation, EF 25-30%, diffuse hypokinesis, prominent apical trabeculation, mildly dilated RV.  4. Hyperlipidemia   Family History:  Mother died with CHF at 81.   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  Quit smoking in 1996.  Occasional ETOH  Married with 2 children.   ROS: All systems reviewed and negative except as per HPI.   Current Outpatient Prescriptions  Medication Sig Dispense Refill  . aspirin 81 MG tablet Take 81 mg by mouth daily.      .  carvedilol (COREG) 12.5 MG tablet Take 1 tablet (12.5 mg total) by mouth 2 (two) times daily.  60 tablet  0  . lisinopril (PRINIVIL,ZESTRIL) 20 MG tablet Take 1 tablet (20 mg total) by mouth daily.  30 tablet  6  . spironolactone (ALDACTONE) 25 MG tablet TAKE 1 TABLET EVERY DAY  30 tablet  5   No current facility-administered medications for this visit.    BP 122/64  Pulse 60  Ht 5\' 10"  (1.778 m)  Wt 110.224 kg (243 lb)  BMI 34.87 kg/m2 General: NAD, obese Neck: No JVD, no thyromegaly or thyroid nodule.  Lungs: Clear to auscultation bilaterally with normal respiratory effort. CV: Nondisplaced PMI.  Heart regular S1/S2, no S3/S4, no murmur.  No peripheral edema.  No carotid bruit.  Normal pedal pulses.  Abdomen: Soft, nontender, no hepatosplenomegaly, no distention.  Neurologic: Alert and oriented x 3.  Psych: Normal affect. Extremities: No clubbing or cyanosis.   Assessment/Plan: 1. Nonischemic cardiomyopathy: Possibly due to viral myocarditis versus HTN.  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 Last echo in 1/15 showed EF 25-30%.  He has been doing well since I saw him last and denies exertional dyspnea or chest pain.  No volume overload on exam.  - By EF, would be ICD candidate.  However, symptomatically, he is NYHA class I by his report, which would make it difficult to justify an ICD.  I am going to have  him do a CPX.  If his exercise capacity is below expected, he will get an ICD.   - Continue current Coreg and spironolactone.  - Increase lisinopril to 20 mg daily with BMET in 2 wks.  - I will see him back in 4 months.  2. HTN: BP controlled on current regimen.   Loralie Champagne 02/06/2014

## 2014-02-17 ENCOUNTER — Encounter (HOSPITAL_COMMUNITY): Payer: Medicare Other

## 2014-02-19 ENCOUNTER — Other Ambulatory Visit (INDEPENDENT_AMBULATORY_CARE_PROVIDER_SITE_OTHER): Payer: Medicare Other

## 2014-02-19 ENCOUNTER — Ambulatory Visit (HOSPITAL_COMMUNITY): Payer: Medicare Other | Attending: Cardiology

## 2014-02-19 DIAGNOSIS — I501 Left ventricular failure: Secondary | ICD-10-CM | POA: Insufficient documentation

## 2014-02-19 DIAGNOSIS — R0989 Other specified symptoms and signs involving the circulatory and respiratory systems: Secondary | ICD-10-CM

## 2014-02-19 DIAGNOSIS — R0609 Other forms of dyspnea: Secondary | ICD-10-CM

## 2014-02-19 LAB — BASIC METABOLIC PANEL
BUN: 18 mg/dL (ref 6–23)
CALCIUM: 9.6 mg/dL (ref 8.4–10.5)
CO2: 24 mEq/L (ref 19–32)
CREATININE: 1.1 mg/dL (ref 0.4–1.5)
Chloride: 102 mEq/L (ref 96–112)
GFR: 82.77 mL/min (ref >60.00)
Glucose, Bld: 82 mg/dL (ref 70–99)
Potassium: 4 mEq/L (ref 3.5–5.1)
Sodium: 136 mEq/L (ref 135–145)

## 2014-02-20 ENCOUNTER — Other Ambulatory Visit: Payer: Self-pay | Admitting: Cardiology

## 2014-03-10 ENCOUNTER — Telehealth: Payer: Self-pay | Admitting: *Deleted

## 2014-03-10 NOTE — Telephone Encounter (Signed)
LMTCB

## 2014-03-10 NOTE — Telephone Encounter (Signed)
Discussed results with patient. Pt would like to talk with Dr Aundra Dubin about results and need for ICD before making appt to see Dr Rayann Heman. Pt advised I would ask Dr Aundra Dubin to talk with him before referral is made to Dr Rayann Heman. Pt knows it may be Wednesday.

## 2014-03-10 NOTE — Telephone Encounter (Signed)
Follow up  ° ° ° °Returning call back to nurse  °

## 2014-03-10 NOTE — Telephone Encounter (Signed)
Message copied by Katrine Coho on Mon Mar 10, 2014 10:48 AM ------      Message from: Larey Dresser      Created: Fri Mar 07, 2014 12:29 PM       I did not see CPX.  Probably low normal to mildly decreased functional capacity.  I think that he ought to get an ICD.  We discussed this some at last appt.  I would like him to see Dr. Rayann Heman to discuss ICD.       THanks.      ----- Message -----         From: Katrine Coho, RN         Sent: 03/07/2014  11:16 AM           To: Larey Dresser, MD            Did you see his CPX results from 02/19/14? Anne       ------

## 2014-03-12 NOTE — Telephone Encounter (Signed)
Dr Aundra Dubin attempted to call pt X 2 today and was unable to reach him.

## 2014-03-17 NOTE — Telephone Encounter (Signed)
Dr Aundra Dubin spoke with patient. Pt will begin regular exercise and plan to repeat echo in 6 months. Pt should follow up with Dr Aundra Dubin in July 2015.

## 2014-06-07 ENCOUNTER — Other Ambulatory Visit: Payer: Self-pay | Admitting: Cardiology

## 2014-07-16 ENCOUNTER — Encounter: Payer: Self-pay | Admitting: Cardiology

## 2014-07-16 ENCOUNTER — Ambulatory Visit (INDEPENDENT_AMBULATORY_CARE_PROVIDER_SITE_OTHER): Payer: Medicare Other | Admitting: Cardiology

## 2014-07-16 VITALS — BP 100/70 | HR 71 | Ht 70.0 in | Wt 244.0 lb

## 2014-07-16 DIAGNOSIS — I428 Other cardiomyopathies: Secondary | ICD-10-CM

## 2014-07-16 DIAGNOSIS — I10 Essential (primary) hypertension: Secondary | ICD-10-CM

## 2014-07-16 LAB — BASIC METABOLIC PANEL
BUN: 18 mg/dL (ref 6–23)
CO2: 29 mEq/L (ref 19–32)
CREATININE: 1.1 mg/dL (ref 0.4–1.5)
Calcium: 9.3 mg/dL (ref 8.4–10.5)
Chloride: 101 mEq/L (ref 96–112)
GFR: 85.28 mL/min (ref >60.00)
GLUCOSE: 95 mg/dL (ref 70–99)
POTASSIUM: 4 meq/L (ref 3.5–5.1)
Sodium: 136 mEq/L (ref 135–145)

## 2014-07-16 LAB — BRAIN NATRIURETIC PEPTIDE: Pro B Natriuretic peptide (BNP): 21 pg/mL (ref 0.0–100.0)

## 2014-07-16 MED ORDER — CARVEDILOL 12.5 MG PO TABS: ORAL_TABLET | ORAL | Status: DC

## 2014-07-16 NOTE — Progress Notes (Signed)
Patient ID: ROBERTLEE ROGACKI, male   DOB: [birth year], 68 y.o.   MRN: [id number] PCP: Dr. Virgina Jock  68 yo with history of HTN and nonischemic cardiomyopathy (diagnosed in 2011) presents for cardiology followup.  He had an echo in 1/15, showing that EF remains 25-30% with mild LV dilation.  I had him do a cardiopulmonary exercise test in 3/15 that showed normal functional capacity.  He denies exertional dyspnea or chest pain.  He has not orthopnea, bendopnea, or PND.  No syncope or presyncope.  He can climb a flight of steps without problems.  He is not exercising as much as he needs to be.   Labs (6/11): K 3.9, TSH normal, creatinine 1.0  Labs (7/11): HDL 60, LDL 122, K 4.4, creatinine 1.0, BNP 86, SPEP normal, transferrin saturation 24%  Labs (9/11): LDL 67, HDL 48, TGs 145, LFTs normal  Labs (10/11): K 4, creatinine 1.2  Labs (5/13): K 3.9, creatinine 1.0, LDL 66, HDL 44 Labs (7/14): K 3.9, creatinine 1.1 Labs (3/15): K 4.1, creatinine 1.1  ECG: NSR, left axis deviation, inferior and lateral TWIs.   Allergies (verified):  No Known Drug Allergies   Past Medical History:  1. HTN  2. Left hand neuropathy  3. Nonischemic cardiomyopathy: Echo (6/11) with EF 30-35%, posteroinferior severe hypokinesis, moderate diastolic dysfunction, mild left atrial enlargement, mild LV hypertrophy. LHC (7/11): EF 30-35% with diffuse hypokinesis, no angiographic CAD. SPEP and Fe studies unremarkable, TSH normal.  Repeat echo (1/12) with EF 45%, mild global hypokinesis, mild LVH, normal RV size and systolic function.  Echo (6/14) with EF 20-25%, diffuse hypokinesis, mild LV dilation. Echo (1/15) with mild LV dilation, EF 25-30%, diffuse hypokinesis, prominent apical trabeculation, mildly dilated RV. CPX (3/15) with VO2 max 20.1, VE/VCO2 slope 34.7 (near normal when adjusted to respiratory compensation point) => this CPX showed normal functional capacity for his age.  4. Hyperlipidemia   Family History:  Mother  died with CHF at 53.   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  Quit smoking in 1996.  Occasional ETOH  Married with 3 children. Wife is now in dementia unit at [facility].    ROS: All systems reviewed and negative except as per HPI.   Current Outpatient Prescriptions  Medication Sig Dispense Refill  . aspirin 81 MG tablet Take 81 mg by mouth daily.      Marland Kitchen lisinopril (PRINIVIL,ZESTRIL) 20 MG tablet Take 1 tablet (20 mg total) by mouth daily.  30 tablet  6  . spironolactone (ALDACTONE) 25 MG tablet TAKE 1 TABLET EVERY DAY  30 tablet  5  . carvedilol (COREG) 12.5 MG tablet 1 and 1/2 tablets (18.75mg ) two times a day  90 tablet  6   No current facility-administered medications for this visit.    BP 100/70  Pulse 71  Ht 5\' 10"  (1.778 m)  Wt 110.678 kg (244 lb)  BMI 35.01 kg/m2 General: NAD, obese Neck: No JVD, no thyromegaly or thyroid nodule.  Lungs: Clear to auscultation bilaterally with normal respiratory effort. CV: Nondisplaced PMI.  Heart regular S1/S2, no S3/S4, no murmur.  No peripheral edema.  No carotid bruit.  Normal pedal pulses.  Abdomen: Soft, nontender, no hepatosplenomegaly, no distention.  Neurologic: Alert and oriented x 3.  Psych: Normal affect. Extremities: No clubbing or cyanosis.   Assessment/Plan: 1. Nonischemic cardiomyopathy: Possibly due to viral myocarditis versus HTN.  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 Last echo in 1/15 showed EF 25-30%.  NYHA class I symptoms with normal functional capacity for his age on CPX.  No volume overload on exam.  - Given NYHA class I confirmed by CPX, would be hard to justify ICD implantation.  Will continue to follow closely, repeat echo in 1/16.   - Increase Coreg to 18.75 mg bid.  - Continue current lisinopril and spironolactone.  - BMET/BNP today.  2. HTN:  BP controlled on current regimen.   Loralie Champagne 07/16/2014

## 2014-07-16 NOTE — Patient Instructions (Signed)
Increase coreg(carvedilol) to 18.75mg  two times a day. This will be 1 and 1/2 of a 12.5mg  tablet two times a day.   Your physician recommends that you have  lab work today--BMET/BNP.  Your physician wants you to follow-up in: 6 months with Dr Aundra Dubin. (February 2016).  You will receive a reminder letter in the mail two months in advance. If you don't receive a letter, please call our office to schedule the follow-up appointment.

## 2014-07-21 ENCOUNTER — Telehealth: Payer: Self-pay | Admitting: Cardiology

## 2014-07-21 NOTE — Telephone Encounter (Signed)
Spoke with patient about recent lab results 

## 2014-07-21 NOTE — Telephone Encounter (Signed)
New problem:     Pt returned this office call please give him a call back.

## 2014-07-31 ENCOUNTER — Encounter: Payer: Self-pay | Admitting: Internal Medicine

## 2014-08-12 ENCOUNTER — Other Ambulatory Visit: Payer: Self-pay | Admitting: Cardiology

## 2014-09-15 ENCOUNTER — Other Ambulatory Visit: Payer: Self-pay | Admitting: Cardiology

## 2015-01-12 ENCOUNTER — Encounter: Payer: Self-pay | Admitting: Cardiology

## 2015-01-12 ENCOUNTER — Ambulatory Visit (INDEPENDENT_AMBULATORY_CARE_PROVIDER_SITE_OTHER): Payer: Medicare Other | Admitting: Cardiology

## 2015-01-12 ENCOUNTER — Encounter: Payer: Self-pay | Admitting: *Deleted

## 2015-01-12 VITALS — BP 126/74 | HR 63 | Ht 70.0 in | Wt 244.0 lb

## 2015-01-12 DIAGNOSIS — I10 Essential (primary) hypertension: Secondary | ICD-10-CM

## 2015-01-12 DIAGNOSIS — I428 Other cardiomyopathies: Secondary | ICD-10-CM

## 2015-01-12 DIAGNOSIS — I429 Cardiomyopathy, unspecified: Secondary | ICD-10-CM

## 2015-01-12 LAB — BASIC METABOLIC PANEL
BUN: 19 mg/dL (ref 6–23)
CALCIUM: 9.5 mg/dL (ref 8.4–10.5)
CO2: 30 mEq/L (ref 19–32)
CREATININE: 1.16 mg/dL (ref 0.40–1.50)
Chloride: 105 mEq/L (ref 96–112)
GFR: 80.09 mL/min (ref >60.00)
GLUCOSE: 77 mg/dL (ref 70–99)
Potassium: 4 mEq/L (ref 3.5–5.1)
Sodium: 139 mEq/L (ref 135–145)

## 2015-01-12 LAB — BRAIN NATRIURETIC PEPTIDE: Pro B Natriuretic peptide (BNP): 69 pg/mL (ref 0.0–100.0)

## 2015-01-12 MED ORDER — CARVEDILOL 25 MG PO TABS
25.0000 mg | ORAL_TABLET | Freq: Two times a day (BID) | ORAL | Status: DC
Start: 2015-01-12 — End: 2017-06-29

## 2015-01-12 NOTE — Patient Instructions (Signed)
Increase coreg (carvedilol) to 25mg  two times a day. You can take 2 of your 12.5mg  tablets two times a day and use your current supply.  Your physician recommends that you have  lab work today---BMET/BNP  Your physician has requested that you have an echocardiogram. Echocardiography is a painless test that uses sound waves to create images of your heart. It provides your doctor with information about the size and shape of your heart and how well your heart's chambers and valves are working. This procedure takes approximately one hour. There are no restrictions for this procedure. MARCH 2016  Your physician has recommended that you have a cardiopulmonary stress test (CPX). CPX testing is a non-invasive measurement of heart and lung function. It replaces a traditional treadmill stress test. This type of test provides a tremendous amount of information that relates not only to your present condition but also for future outcomes. This test combines measurements of you ventilation, respiratory gas exchange in the lungs, electrocardiogram (EKG), blood pressure and physical response before, during, and following an exercise protocol. MARCH 2016  Your physician wants you to follow-up in: 6 months with Dr Aundra Dubin in the [facility] [place] will receive a reminder letter in the mail two months in advance. If you don't receive a letter, please call our office to schedule the follow-up appointment.

## 2015-01-12 NOTE — Progress Notes (Signed)
Patient ID: Kevin Murillo, male   DOB: [birth year], 69 y.o.   MRN: [id number] PCP: Dr. Virgina Jock  69 yo with history of HTN and nonischemic cardiomyopathy (diagnosed in 2011) presents for cardiology followup.  He had an echo in 1/15, showing that EF remains 25-30% with mild LV dilation.  I had him do a cardiopulmonary exercise test in 3/15 that showed normal functional capacity.  He denies exertional dyspnea or chest pain.  He has not orthopnea, bendopnea, or PND.  No syncope or presyncope.  He can climb a flight of steps without problems.  Weight is stable.   Labs (6/11): K 3.9, TSH normal, creatinine 1.0  Labs (7/11): HDL 60, LDL 122, K 4.4, creatinine 1.0, BNP 86, SPEP normal, transferrin saturation 24%  Labs (9/11): LDL 67, HDL 48, TGs 145, LFTs normal  Labs (10/11): K 4, creatinine 1.2  Labs (5/13): K 3.9, creatinine 1.0, LDL 66, HDL 44 Labs (7/14): K 3.9, creatinine 1.1 Labs (3/15): K 4.1, creatinine 1.1 Labs (8/15): K 4, creatinine 1.1, BNP 21  Allergies (verified):  No Known Drug Allergies   Past Medical History:  1. HTN  2. Left hand neuropathy  3. Nonischemic cardiomyopathy: Echo (6/11) with EF 30-35%, posteroinferior severe hypokinesis, moderate diastolic dysfunction, mild left atrial enlargement, mild LV hypertrophy. LHC (7/11): EF 30-35% with diffuse hypokinesis, no angiographic CAD. SPEP and Fe studies unremarkable, TSH normal.  Repeat echo (1/12) with EF 45%, mild global hypokinesis, mild LVH, normal RV size and systolic function.  Echo (6/14) with EF 20-25%, diffuse hypokinesis, mild LV dilation. Echo (1/15) with mild LV dilation, EF 25-30%, diffuse hypokinesis, prominent apical trabeculation, mildly dilated RV. CPX (3/15) with VO2 max 20.1, VE/VCO2 slope 34.7 (near normal when adjusted to respiratory compensation point) => this CPX showed normal functional capacity for his age.  4. Hyperlipidemia   Family History:  Mother died with CHF at 40.   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  Quit smoking in 1996.  Occasional ETOH  Married with 3 children. Wife is now in dementia unit at [facility].    ROS: All systems reviewed and negative except as per HPI.   Current Outpatient Prescriptions  Medication Sig Dispense Refill  . aspirin 81 MG tablet Take 81 mg by mouth daily.    . carvedilol (COREG) 25 MG tablet Take 1 tablet (25 mg total) by mouth 2 (two) times daily. 180 tablet 3  . lisinopril (PRINIVIL,ZESTRIL) 20 MG tablet TAKE 1 TABLET (20 MG TOTAL) BY MOUTH DAILY. 30 tablet 6  . spironolactone (ALDACTONE) 25 MG tablet TAKE 1 TABLET EVERY DAY 30 tablet 5   No current facility-administered medications for this visit.    BP 126/74 mmHg  Pulse 63  Ht 5\' 10"  (1.778 m)  Wt 244 lb (110.678 kg)  BMI 35.01 kg/m2 General: NAD, obese Neck: No JVD, no thyromegaly or thyroid nodule.  Lungs: Clear to auscultation bilaterally with normal respiratory effort. CV: Nondisplaced PMI.  Heart regular S1/S2, no S3/S4, no murmur.  No peripheral edema.  No carotid bruit.  Normal pedal pulses.  Abdomen: Soft, nontender, no hepatosplenomegaly, no distention.  Neurologic: Alert and oriented x 3.  Psych: Normal affect. Extremities: No clubbing or cyanosis.   Assessment/Plan: 1. Nonischemic cardiomyopathy: Possibly due to viral myocarditis versus HTN.  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 Last echo in  1/15 showed EF 25-30%.  NYHA class I symptoms with normal functional capacity for his age on CPX in 3/15.  No volume overload on exam.  - With NYHA class I symptoms, he has not had an ICD implanted.  I will repeat a CPX in 3/16 to see if he still has normal functional capacity. - Increase Coreg to 25 mg bid.  - Continue current lisinopril and spironolactone.  - BMET/BNP today.  - I will have an echo done to follow LV  systolic function.  2. HTN: BP controlled on current regimen.   Followup in 6 months.   Loralie Champagne 01/12/2015

## 2015-02-04 ENCOUNTER — Ambulatory Visit (HOSPITAL_COMMUNITY): Payer: Medicare Other | Attending: Cardiovascular Disease | Admitting: Cardiology

## 2015-02-04 DIAGNOSIS — I428 Other cardiomyopathies: Secondary | ICD-10-CM | POA: Diagnosis not present

## 2015-02-04 DIAGNOSIS — I429 Cardiomyopathy, unspecified: Secondary | ICD-10-CM | POA: Diagnosis not present

## 2015-02-04 NOTE — Progress Notes (Signed)
Echo performed. 

## 2015-02-06 ENCOUNTER — Other Ambulatory Visit (HOSPITAL_COMMUNITY): Payer: Medicare Other

## 2015-02-09 ENCOUNTER — Ambulatory Visit (HOSPITAL_COMMUNITY): Payer: Medicare Other | Attending: Cardiology

## 2015-02-09 ENCOUNTER — Other Ambulatory Visit: Payer: Self-pay | Admitting: Cardiology

## 2015-02-09 DIAGNOSIS — I429 Cardiomyopathy, unspecified: Secondary | ICD-10-CM | POA: Insufficient documentation

## 2015-02-09 DIAGNOSIS — I428 Other cardiomyopathies: Secondary | ICD-10-CM

## 2015-03-23 ENCOUNTER — Other Ambulatory Visit: Payer: Self-pay | Admitting: Cardiology

## 2015-08-18 ENCOUNTER — Other Ambulatory Visit: Payer: Self-pay | Admitting: Cardiology

## 2015-12-19 ENCOUNTER — Other Ambulatory Visit: Payer: Self-pay | Admitting: Cardiology

## 2016-01-16 ENCOUNTER — Other Ambulatory Visit: Payer: Self-pay | Admitting: Cardiology

## 2016-01-21 ENCOUNTER — Other Ambulatory Visit: Payer: Self-pay | Admitting: Cardiology

## 2016-03-16 ENCOUNTER — Other Ambulatory Visit: Payer: Self-pay | Admitting: Cardiology

## 2016-03-21 ENCOUNTER — Other Ambulatory Visit: Payer: Self-pay | Admitting: Cardiology

## 2016-04-04 ENCOUNTER — Other Ambulatory Visit: Payer: Self-pay | Admitting: Cardiology

## 2016-05-01 ENCOUNTER — Other Ambulatory Visit: Payer: Self-pay | Admitting: Cardiology

## 2016-05-02 ENCOUNTER — Other Ambulatory Visit: Payer: Self-pay | Admitting: *Deleted

## 2016-05-02 MED ORDER — SPIRONOLACTONE 25 MG PO TABS: 25.0000 mg | ORAL_TABLET | Freq: Every day | ORAL | Status: DC

## 2016-05-02 MED ORDER — LISINOPRIL 20 MG PO TABS: 20.0000 mg | ORAL_TABLET | Freq: Every day | ORAL | Status: DC

## 2016-05-19 NOTE — Progress Notes (Signed)
Cardiology Office Note:    Date:  05/20/2016   ID:  Kevin Murillo, DOB 1946-02-08, MRN [id number]  PCP:  Precious Reel, MD  Cardiologist:  Dr. Loralie Champagne   Electrophysiologist:  n/a  Referring MD: Shon Baton, MD   Chief Complaint  Patient presents with  . Congestive Heart Failure    follow up    History of Present Illness:     Kevin Murillo is a 70 y.o. male with a hx of HTN, NICM.  LHC in 2011 demonstrated no angiographic CAD. He had an echocardiogram in 1/15 that demonstrated an EF of 25-30%.  Cardiopulmonary stress test in 3/15 showed normal functional capacity. Last seen by Dr. Aundra Dubin 2/16. At that time, he noted prominent apical trabeculation on echocardiogram. 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 With NYHA 1 symptoms, he had not had an ICD implanted. Repeat CPX test demonstrated low normal functional capacity. Last echo in 4/16 demonstrated improved LV function with an EF of 40-45%.  He returns for follow-up.he is doing well.  The patient denies chest pain, shortness of breath, syncope, orthopnea, PND or significant pedal edema.   Past Medical History  Diagnosis Date  . Tubular adenoma of colon 12/2010  . Hypertension   . Hyperlipidemia   . Neuropathy of hand   . Obesity   . Rosacea   . Non-ischemic cardiomyopathy ([place])     a. Echo 6/11: 30-35% // b. LHC 7/11: EF 30-35% with diffuse hypokinesis, no angiographic CAD. SPEP and Fe studies unremarkable, TSH normal.//  c. Echo 1/12: EF 45% // d. Echo 6/14: EF 20-25%  //  e. Echo EF 25-30%, diffuse hypokinesis, prominent apical trabeculation // f. CPX 3/15: normal fxnal capacity  // g. CPX 3/16 low norma fxnal capacity  // h. Echo 3/16: mild LVH, EF 40-45%    . PVC (premature ventricular contraction)   . Chronic systolic CHF (congestive heart failure), NYHA class 1 ([place])   . History of echocardiogram     Echo 3/16: Mild LVH, EF 40-45%,  anteroseptal akinesis, grade 1 diastolic dysfunction, moderate LAE  1. HTN  2. Left hand neuropathy  3. Nonischemic cardiomyopathy: Echo (6/11) with EF 30-35%, posteroinferior severe hypokinesis, moderate diastolic dysfunction, mild left atrial enlargement, mild LV hypertrophy. LHC (7/11): EF 30-35% with diffuse hypokinesis, no angiographic CAD. SPEP and Fe studies unremarkable, TSH normal. Repeat echo (1/12) with EF 45%, mild global hypokinesis, mild LVH, normal RV size and systolic function. Echo (6/14) with EF 20-25%, diffuse hypokinesis, mild LV dilation. Echo (1/15) with mild LV dilation, EF 25-30%, diffuse hypokinesis, prominent apical trabeculation, mildly dilated RV. CPX (3/15) with VO2 max 20.1, VE/VCO2 slope 34.7 (near normal when adjusted to respiratory compensation point) => this CPX showed normal functional capacity for his age.  4. Hyperlipidemia    Current Medications: Outpatient Prescriptions Prior to Visit  Medication Sig Dispense Refill  . aspirin 81 MG tablet Take 81 mg by mouth daily.    . carvedilol (COREG) 25 MG tablet Take 1 tablet (25 mg total) by mouth 2 (two) times daily. 180 tablet 3  . lisinopril (PRINIVIL,ZESTRIL) 20 MG tablet Take 1 tablet (20 mg total) by mouth daily. Patient is overdue for an appointment. Please call and schedule for further refills 15 tablet 0  . spironolactone (ALDACTONE) 25 MG tablet Take 1 tablet (25 mg total) by mouth daily. Patient is overdue for an appointment. Please call and schedule for further  refills 15 tablet 0   No facility-administered medications prior to visit.      Allergies:   Review of patient's allergies indicates no known allergies.   Social History   Social History  . Marital Status: Married    Spouse Name: N/A  . Number of Children: 2  . Years of Education: N/A   Occupational History  . Retired Careers information officer   . Drives Taxi    Social History Main Topics  . Smoking status: Former Smoker    Types: Cigarettes     Quit date: 03/12/2002  . Smokeless tobacco: Never Used  . Alcohol Use: Yes     Comment: Socially  . Drug Use: No  . Sexual Activity: Not Asked   Other Topics Concern  . None   Social History Narrative   Married, 2 sons   Retired Pensions consultant, referee   1-2 caffeine beverages daily   As of  03/12/2013           ROS:   Please see the history of present illness.    ROS All other systems reviewed and are negative.   Physical Exam:    VS:  BP 120/70 mmHg  Pulse 54  Ht 5\' 10"  (1.778 m)  Wt 241 lb 6.4 oz (109.498 kg)  BMI 34.64 kg/m2   Physical Exam  Constitutional: He is oriented to person, place, and time. He appears well-developed and well-nourished.  HENT:  Head: Normocephalic and atraumatic.  Eyes: No scleral icterus.  Neck: Normal range of motion. No JVD present. Carotid bruit is not present.  Cardiovascular: Normal rate, regular rhythm, S1 normal and S2 normal.  Exam reveals no gallop and no friction rub.   No murmur heard. Pulmonary/Chest: Effort normal and breath sounds normal. He has no wheezes. He has no rhonchi. He has no rales.  Abdominal: Soft. He exhibits no distension and no mass. There is no tenderness.  Musculoskeletal: Normal range of motion. He exhibits no edema.  Lymphadenopathy:    He has no cervical adenopathy.  Neurological: He is alert and oriented to person, place, and time.  Skin: Skin is warm and dry.  Psychiatric: He has a normal mood and affect.    Wt Readings from Last 3 Encounters:  05/20/16 241 lb 6.4 oz (109.498 kg)  01/12/15 244 lb (110.678 kg)  07/16/14 244 lb (110.678 kg)      Studies/Labs Reviewed:     EKG:  EKG is  ordered today.  The ekg ordered today demonstrates sinus brady, HR 54, LAD, septal Q waves, inf-lat TWI, no changes  Recent Labs: No results found for requested labs within last 365 days.   Recent Lipid Panel    Component Value Date/Time   CHOL 144 08/03/2010 0828   TRIG 145.0 08/03/2010 0828   HDL  48.00 08/03/2010 0828   CHOLHDL 3 08/03/2010 0828   VLDL 29.0 08/03/2010 0828   LDLCALC 67 08/03/2010 0828   LDLDIRECT 121.7 05/28/2010 0846    Additional studies/ records that were reviewed today include:   CPX 3/16 Conclusion: Exercise testing with gas-exchange demonstrates a low-normal functional capacity when compared to matched sedentary norms. The patient's limitation appears to be multifactorial in nature including mild HF, obesity, a hypertensive response to exercise and possible orthopedic limitations. .   Echo 02/04/15 - Left ventricle: The cavity size was normal. Wall thickness was increased in a pattern of mild LVH. Systolic function was mildly to moderately reduced. The estimated ejection fraction was in  the range of 40% to 45%. Akinesis of the basalanteroseptal myocardium. Doppler parameters are consistent with abnormal left ventricular relaxation (grade 1 diastolic dysfunction). - Left atrium: The atrium was moderately dilated.  LHC 7/11 EF 30-35% No CAD   ASSESSMENT:     1. Nonischemic cardiomyopathy ([place])   2. Essential hypertension     PLAN:     In order of problems listed above:  1. Nonischemic cardiomyopathy: Possibly due to viral myocarditis versus HTN. 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 Last echo in 3/16 with EF 40-45%. CPX in 3/16 with low normal functional capacity.  He remains NYHA 1. Continue current regimen.  Recent creatinine in 6/17 with his PCP was normal.    2. HTN:  Controlled.  Continue current Rx.    Medication Adjustments/Labs and Tests Ordered: Current medicines are reviewed at length with the patient today.  Concerns regarding medicines are outlined above.  Medication changes, Labs and Tests ordered today are outlined in the Patient Instructions noted below. Patient Instructions  Medication Instructions:  No changes.  Refills have been sent to your  pharmacy.  Labwork: None today.   Testing/Procedures: None   Follow-Up: Dr. Loralie Champagne in 6 months.  You can ask to see him in the [facility] if you would like.  Any Other Special Instructions Will Be Listed Below (If Applicable).  If you need a refill on your cardiac medications before your next appointment, please call your pharmacy.    Signed, Richardson Dopp, PA-C  05/20/2016 11:59 AM    [facility] [address], [place], [place]  [address] Phone: [phone]; Fax: [phone]

## 2016-05-20 ENCOUNTER — Encounter: Payer: Self-pay | Admitting: Physician Assistant

## 2016-05-20 ENCOUNTER — Ambulatory Visit (INDEPENDENT_AMBULATORY_CARE_PROVIDER_SITE_OTHER): Payer: Medicare Other | Admitting: Physician Assistant

## 2016-05-20 VITALS — BP 120/70 | HR 54 | Ht 70.0 in | Wt 241.4 lb

## 2016-05-20 DIAGNOSIS — I10 Essential (primary) hypertension: Secondary | ICD-10-CM | POA: Diagnosis not present

## 2016-05-20 DIAGNOSIS — I429 Cardiomyopathy, unspecified: Secondary | ICD-10-CM | POA: Diagnosis not present

## 2016-05-20 DIAGNOSIS — I428 Other cardiomyopathies: Secondary | ICD-10-CM

## 2016-05-20 MED ORDER — SPIRONOLACTONE 25 MG PO TABS: 25.0000 mg | ORAL_TABLET | Freq: Every day | ORAL | Status: DC

## 2016-05-20 MED ORDER — LISINOPRIL 20 MG PO TABS: 20.0000 mg | ORAL_TABLET | Freq: Every day | ORAL | Status: DC

## 2016-05-20 NOTE — Patient Instructions (Signed)
Medication Instructions:  No changes.  Refills have been sent to your pharmacy.  Labwork: None today.   Testing/Procedures: None   Follow-Up: Dr. Loralie Champagne in 6 months.  You can ask to see him in the Heart Failure Clinic if you would like.  Any Other Special Instructions Will Be Listed Below (If Applicable).  If you need a refill on your cardiac medications before your next appointment, please call your pharmacy.

## 2016-05-21 NOTE — Progress Notes (Signed)
Needs to see me in CHF clinic.

## 2016-05-24 NOTE — Progress Notes (Signed)
S/w Vashti Hey in [facility]. She states she will send a message to [place]ell[place] in the [facility] for a recall for 6 months for Dr. Aundra Dubin.

## 2016-11-14 ENCOUNTER — Ambulatory Visit: Payer: Medicare Other | Admitting: Physician Assistant

## 2016-11-14 NOTE — Progress Notes (Deleted)
Cardiology Office Note:    Date:  11/14/2016   ID:  Kevin Murillo, DOB 17-Aug-1946, MRN [id number]  PCP:  Precious Reel, MD  Cardiologist:  Dr. Loralie Champagne   Electrophysiologist:  n/a  Referring MD: Shon Baton, MD   No chief complaint on file. ***  History of Present Illness:    DEMORIAN CANCHOLA is a 70 y.o. male with a hx of HTN, NICM.  LHC in 2011 demonstrated no angiographic CAD.  Echo in 1/15 demonstrated EF 25-30.  CPX test in 3/15 demonstrated normal functional capacity.  Patient has been noted to have prominent apical trabeculation on echocardiogram. 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 He is NYHA 1 and therefore, ICD has not been implanted. Cardiopulmonary stress test in 3/16 demonstrated low normal functional capacity. Echo in 4/16 demonstrated improved LV function with an EF of 40-45. Last seen 6/17 by me.    Prior CV studies that were reviewed today include:    CPX 3/16 Conclusion: Exercise testing with gas-exchange demonstrates a low-normal functional capacity when compared to matched sedentary norms. The patient's limitation appears to be multifactorial in nature including mild HF, obesity, a hypertensive response to exercise and possible orthopedic limitations. .   Echo 02/04/15 Mild LVH, EF 40-45, ant-septal AK, Gr 1 DD, mod LAE  LHC 7/11 EF 30-35% No CAD  Past Medical History:  Diagnosis Date  . Chronic systolic CHF (congestive heart failure), NYHA class 1 ([place])   . History of echocardiogram    Echo 3/16: Mild LVH, EF 40-45%, anteroseptal akinesis, grade 1 diastolic dysfunction, moderate LAE  . Hyperlipidemia   . Hypertension   . Neuropathy of hand   . Non-ischemic cardiomyopathy ([place])    a. Echo 6/11: 30-35% // b. LHC 7/11: EF 30-35% with diffuse hypokinesis, no angiographic CAD. SPEP and Fe studies unremarkable, TSH normal.//  c. Echo 1/12: EF 45% // d. Echo  6/14: EF 20-25%  //  e. Echo EF 25-30%, diffuse hypokinesis, prominent apical trabeculation // f. CPX 3/15: normal fxnal capacity  // g. CPX 3/16 low norma fxnal capacity  // h. Echo 3/16: mild LVH, EF 40-45%    . Obesity   . PVC (premature ventricular contraction)   . Rosacea   . Tubular adenoma of colon 12/2010  1. HTN  2. Left hand neuropathy  3. Nonischemic cardiomyopathy: Echo (6/11) with EF 30-35%, posteroinferior severe hypokinesis, moderate diastolic dysfunction, mild left atrial enlargement, mild LV hypertrophy. LHC (7/11): EF 30-35% with diffuse hypokinesis, no angiographic CAD. SPEP and Fe studies unremarkable, TSH normal. Repeat echo (1/12) with EF 45%, mild global hypokinesis, mild LVH, normal RV size and systolic function. Echo (6/14) with EF 20-25%, diffuse hypokinesis, mild LV dilation. Echo (1/15) with mild LV dilation, EF 25-30%, diffuse hypokinesis, prominent apical trabeculation, mildly dilated RV. CPX (3/15) with VO2 max 20.1, VE/VCO2 slope 34.7 (near normal when adjusted to respiratory compensation point) => this CPX showed normal functional capacity for his age.  4. Hyperlipidemia   Past Surgical History:  Procedure Laterality Date  . ARM SURGERY  1967   RIGHT   . COLONOSCOPY    . KNEE ARTHROSCOPY Left     Current Medications: No outpatient prescriptions have been marked as taking for the 11/14/16 encounter (Appointment) with Liliane Shi, PA-C.     Allergies:   Patient has no known allergies.   Social History   Social History  . Marital status: Married  Spouse name: N/A  . Number of children: 2  . Years of education: N/A   Occupational History  . 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 Smoking status: Former Smoker    Types: Cigarettes    Quit date: 03/12/2002  . Smokeless tobacco: Never Used  . Alcohol use Yes     Comment: Socially  . Drug use: No  . Sexual activity: Not on file   Other Topics Concern   . Not on file   Social History Narrative   Married, 2 sons   Retired Pensions consultant, referee   1-2 caffeine beverages daily   As of  03/12/2013           Family History:  The patient's ***family history includes CVA in his mother; Gout in his son.   ROS:   Please see the history of present illness.    ROS All other systems reviewed and are negative.   EKGs/Labs/Other Test Reviewed:    EKG:  EKG is *** ordered today.  The ekg ordered today demonstrates ***  Recent Labs: No results found for requested labs within last 8760 hours.   Recent Lipid Panel    Component Value Date/Time   CHOL 144 08/03/2010 0828   TRIG 145.0 08/03/2010 0828   HDL 48.00 08/03/2010 0828   CHOLHDL 3 08/03/2010 0828   VLDL 29.0 08/03/2010 0828   LDLCALC 67 08/03/2010 0828   LDLDIRECT 121.7 05/28/2010 0846     Physical Exam:    VS:  There were no vitals taken for this visit.    Wt Readings from Last 3 Encounters:  05/20/16 241 lb 6.4 oz (109.5 kg)  01/12/15 244 lb (110.7 kg)  07/16/14 244 lb (110.7 kg)     ***Physical Exam  ASSESSMENT:    No diagnosis found. PLAN:    In order of problems listed above:  1. Nonischemic cardiomyopathy: Possibly due to viral myocarditis versus HTN. 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 Echo in 3/16 demonstrated improved LVF with EF 40-45%. CPX in 3/16 with low normal functional capacity.  ***  2. HTN:  Controlled.  ***  Medication Adjustments/Labs and Tests Ordered: Current medicines are reviewed at length with the patient today.  Concerns regarding medicines are outlined above.  Medication changes, Labs and Tests ordered today are outlined in the Patient Instructions noted below. There are no Patient Instructions on file for this visit. Signed, Richardson Dopp, PA-C  11/14/2016 7:49 AM    [facility] [place], [place], [place]  [address] Phone: [phone]; Fax: [phone]

## 2016-11-22 ENCOUNTER — Encounter: Payer: Self-pay | Admitting: Physician Assistant

## 2017-04-04 ENCOUNTER — Other Ambulatory Visit: Payer: Self-pay | Admitting: Physician Assistant

## 2017-04-04 DIAGNOSIS — I428 Other cardiomyopathies: Secondary | ICD-10-CM

## 2017-05-02 ENCOUNTER — Other Ambulatory Visit: Payer: Self-pay | Admitting: Physician Assistant

## 2017-05-02 DIAGNOSIS — I428 Other cardiomyopathies: Secondary | ICD-10-CM

## 2017-05-05 DIAGNOSIS — Z125 Encounter for screening for malignant neoplasm of prostate: Secondary | ICD-10-CM | POA: Diagnosis not present

## 2017-05-05 DIAGNOSIS — I10 Essential (primary) hypertension: Secondary | ICD-10-CM | POA: Diagnosis not present

## 2017-05-05 DIAGNOSIS — R7309 Other abnormal glucose: Secondary | ICD-10-CM | POA: Diagnosis not present

## 2017-05-12 DIAGNOSIS — Z1389 Encounter for screening for other disorder: Secondary | ICD-10-CM | POA: Diagnosis not present

## 2017-05-12 DIAGNOSIS — I110 Hypertensive heart disease with heart failure: Secondary | ICD-10-CM | POA: Diagnosis not present

## 2017-05-12 DIAGNOSIS — I501 Left ventricular failure: Secondary | ICD-10-CM | POA: Diagnosis not present

## 2017-05-12 DIAGNOSIS — I5022 Chronic systolic (congestive) heart failure: Secondary | ICD-10-CM | POA: Diagnosis not present

## 2017-05-12 DIAGNOSIS — I517 Cardiomegaly: Secondary | ICD-10-CM | POA: Diagnosis not present

## 2017-05-12 DIAGNOSIS — R7309 Other abnormal glucose: Secondary | ICD-10-CM | POA: Diagnosis not present

## 2017-05-12 DIAGNOSIS — Z0000 Encounter for general adult medical examination without abnormal findings: Secondary | ICD-10-CM | POA: Diagnosis not present

## 2017-05-12 DIAGNOSIS — M25552 Pain in left hip: Secondary | ICD-10-CM | POA: Diagnosis not present

## 2017-05-12 DIAGNOSIS — Z6836 Body mass index (BMI) 36.0-36.9, adult: Secondary | ICD-10-CM | POA: Diagnosis not present

## 2017-05-12 DIAGNOSIS — E784 Other hyperlipidemia: Secondary | ICD-10-CM | POA: Diagnosis not present

## 2017-05-12 DIAGNOSIS — I493 Ventricular premature depolarization: Secondary | ICD-10-CM | POA: Diagnosis not present

## 2017-05-12 DIAGNOSIS — I77819 Aortic ectasia, unspecified site: Secondary | ICD-10-CM | POA: Diagnosis not present

## 2017-05-16 DIAGNOSIS — Z1212 Encounter for screening for malignant neoplasm of rectum: Secondary | ICD-10-CM | POA: Diagnosis not present

## 2017-06-03 ENCOUNTER — Other Ambulatory Visit: Payer: Self-pay | Admitting: Physician Assistant

## 2017-06-03 DIAGNOSIS — I428 Other cardiomyopathies: Secondary | ICD-10-CM

## 2017-06-17 ENCOUNTER — Other Ambulatory Visit: Payer: Self-pay | Admitting: Physician Assistant

## 2017-06-17 DIAGNOSIS — I428 Other cardiomyopathies: Secondary | ICD-10-CM

## 2017-06-29 ENCOUNTER — Telehealth: Payer: Self-pay | Admitting: Cardiology

## 2017-06-29 DIAGNOSIS — I428 Other cardiomyopathies: Secondary | ICD-10-CM

## 2017-06-29 MED ORDER — LISINOPRIL 20 MG PO TABS: 20.0000 mg | ORAL_TABLET | Freq: Every day | ORAL | 0 refills | Status: DC

## 2017-06-29 MED ORDER — SPIRONOLACTONE 25 MG PO TABS
25.0000 mg | ORAL_TABLET | Freq: Every day | ORAL | 0 refills | Status: DC
Start: 2017-06-29 — End: 2017-07-25

## 2017-06-29 MED ORDER — CARVEDILOL 25 MG PO TABS: 25.0000 mg | ORAL_TABLET | Freq: Two times a day (BID) | ORAL | 0 refills | Status: DC

## 2017-06-29 NOTE — Telephone Encounter (Signed)
New message      *STAT* If patient is at the pharmacy, call can be transferred to refill team.   1. Which medications need to be refilled? (please list name of each medication and dose if known) lisinopril 20 mg, spironolactone 25 mg  2. Which pharmacy/location (including street and city if local pharmacy) is medication to be sent to? [facility] [address]  3. Do they need a 30 day or 90 day supply? 30 day

## 2017-06-29 NOTE — Telephone Encounter (Signed)
Pt's medication was sent to pt's pharmacy as requested. Confirmation received.  °

## 2017-07-24 NOTE — Progress Notes (Signed)
Cardiology Office Note:    Date:  07/25/2017   ID:  Kevin Murillo, DOB June 17, 1946, MRN [id number]  PCP:  Shon Baton, MD  Cardiologist:  Dr. Loralie Champagne >> Dr. Nelva Bush   Referring MD: Shon Baton, MD   Chief Complaint  Patient presents with  . Cardiomyopathy    follow up    History of Present Illness:    Kevin Murillo is a 71 y.o. male with a hx of HTN, NICM.  LHC in 2011 demonstrated no angiographic CAD. He had an echocardiogram in 1/15 that demonstrated an EF of 25-30%.  Cardiopulmonary stress test in 3/15 showed normal functional capacity. Last seen by Dr. Aundra Dubin 2/16. At that time, he noted prominent apical trabeculation on echocardiogram. 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 With NYHA 1 symptoms, he had not had an ICD implanted. Repeat CPX test demonstrated low normal functional capacity. Last echo in 4/16 demonstrated improved LV function with an EF of 40-45%.  I saw him last in 6/17.    Mr. Kevin Murillo returns for Cardiology follow up.  He is here alone.  He has been doing well.  He denies chest pain, shortness of breath, paroxysmal nocturnal dyspnea, syncope, cough, wheezing, weight gain, bleeding issues.  He has noted edema in his R ankle the last several days.  He denies pain, injury.  He denies any recent travel or admission to the hospital.   Prior CV studies:   The following studies were reviewed today:  CPX 3/16 Conclusion: Exercise testing with gas-exchange demonstrates a low-normal functional capacity when compared to matched sedentary norms.  The patient's limitation appears to be multifactorial in nature including mild HF, obesity, a hypertensive response to exercise and possible orthopedic limitations. .    Echo 02/04/15 - Left ventricle: The cavity size was normal. Wall thickness was   increased in a pattern of mild LVH. Systolic function was mildly   to moderately  reduced. The estimated ejection fraction was in the   range of 40% to 45%. Akinesis of the basalanteroseptal   myocardium. Doppler parameters are consistent with abnormal left   ventricular relaxation (grade 1 diastolic dysfunction). - Left atrium: The atrium was moderately dilated.   LHC 7/11 EF 30-35% No CAD   Past Medical History:  Diagnosis Date  . Chronic systolic CHF (congestive heart failure), NYHA class 1 ([place])   . History of echocardiogram    Echo 3/16: Mild LVH, EF 40-45%, anteroseptal akinesis, grade 1 diastolic dysfunction, moderate LAE  . Hyperlipidemia   . Hypertension   . Neuropathy of hand   . Non-ischemic cardiomyopathy ([place])    a. Echo 6/11: 30-35% // b. LHC 7/11: EF 30-35% with diffuse hypokinesis, no angiographic CAD. SPEP and Fe studies unremarkable, TSH normal.//  c. Echo 1/12: EF 45% // d. Echo 6/14: EF 20-25%  //  e. Echo EF 25-30%, diffuse hypokinesis, prominent apical trabeculation // f. CPX 3/15: normal fxnal capacity  // g. CPX 3/16 low norma fxnal capacity  // h. Echo 3/16: mild LVH, EF 40-45%    . Obesity   . PVC (premature ventricular contraction)   . Rosacea   . Tubular adenoma of colon 12/2010  1. HTN   2. Left hand neuropathy   3. Nonischemic cardiomyopathy: Echo (6/11) with EF 30-35%, posteroinferior severe hypokinesis, moderate diastolic dysfunction, mild left atrial enlargement, mild LV hypertrophy. LHC (7/11): EF 30-35% with diffuse hypokinesis, no angiographic CAD. SPEP  and Fe studies unremarkable, TSH normal.  Repeat echo (1/12) with EF 45%, mild global hypokinesis, mild LVH, normal RV size and systolic function.  Echo (6/14) with EF 20-25%, diffuse hypokinesis, mild LV dilation. Echo (1/15) with mild LV dilation, EF 25-30%, diffuse hypokinesis, prominent apical trabeculation, mildly dilated RV. CPX (3/15) with VO2 max 20.1, VE/VCO2 slope 34.7 (near normal when adjusted to respiratory compensation point) => this CPX showed normal functional capacity  for his age.   4. Hyperlipidemia    Past Surgical History:  Procedure Laterality Date  . ARM SURGERY  1967   RIGHT   . COLONOSCOPY    . KNEE ARTHROSCOPY Left     Current Medications: Current Meds  Medication Sig  . aspirin 81 MG tablet Take 81 mg by mouth daily.  . carvedilol (COREG) 25 MG tablet Take 1 tablet (25 mg total) by mouth 2 (two) times daily.  Marland Kitchen lisinopril (PRINIVIL,ZESTRIL) 20 MG tablet Take 1 tablet (20 mg total) by mouth daily.  . simvastatin (ZOCOR) 40 MG tablet Take 40 mg by mouth daily at 6 PM.   . spironolactone (ALDACTONE) 25 MG tablet Take 1 tablet (25 mg total) by mouth daily.     Allergies:   Patient has no known allergies.   Social History   Social History  . Marital status: Married    Spouse name: N/A  . Number of children: 2  . Years of education: N/A   Occupational History  . 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 Smoking status: Former Smoker    Types: Cigarettes    Quit date: 03/12/2002  . Smokeless tobacco: Never Used  . Alcohol use Yes     Comment: Socially  . Drug use: No  . Sexual activity: Not Asked   Other Topics Concern  . None   Social History Narrative   Married, 2 sons   Retired Pensions consultant, referee   1-2 caffeine beverages daily   As of  03/12/2013           Family Hx: The patient's family history includes CVA in his mother; Gout in his son; Heart attack in his unknown relative. There is no history of Colon cancer or Heart attack.  ROS:   Please see the history of present illness.    ROS All other systems reviewed and are negative.   EKGs/Labs/Other Test Reviewed:    EKG:  EKG is  ordered today.  The ekg ordered today demonstrates NSR, HR 82, LAD, TWI V4-6, QTc 436 ms, no changes.   Recent Labs: No results found for requested labs within last 8760 hours.   Recent Lipid Panel Lab Results  Component Value Date/Time   CHOL 144 08/03/2010 08:28 AM   TRIG 145.0  08/03/2010 08:28 AM   HDL 48.00 08/03/2010 08:28 AM   CHOLHDL 3 08/03/2010 08:28 AM   LDLCALC 67 08/03/2010 08:28 AM   LDLDIRECT 121.7 05/28/2010 08:46 AM    Physical Exam:    VS:  BP 100/70   Pulse 82   Ht 5\' 10"  (1.778 m)   Wt 244 lb (110.7 kg)   SpO2 94%   BMI 35.01 kg/m     Wt Readings from Last 3 Encounters:  07/25/17 244 lb (110.7 kg)  05/20/16 241 lb 6.4 oz (109.5 kg)  01/12/15 244 lb (110.7 kg)     Physical Exam  Constitutional: He is oriented to person, place, and time.  He appears well-developed and well-nourished. No distress.  HENT:  Head: Normocephalic and atraumatic.  Eyes: No scleral icterus.  Neck: No JVD present.  Cardiovascular: Normal rate and regular rhythm.   No murmur heard. Pulmonary/Chest: Effort normal. He has no rales.  Abdominal: Soft. There is no tenderness.  Musculoskeletal: He exhibits edema (1+ R ankle edema).  Neurological: He is alert and oriented to person, place, and time.  Skin: Skin is warm and dry.  Psychiatric: He has a normal mood and affect.    ASSESSMENT:    1. Nonischemic cardiomyopathy ([place])   2. Essential hypertension   3. Leg edema, right    PLAN:    In order of problems listed above:  1. Nonischemic cardiomyopathy ([place])  Etiology for cardiomyopathy has not been clear in the past. Possible considerations include viral myocarditis versus hypertension as well as LV non-compaction given prominent apical trabeculation on prior echocardiogram. Last echo in 3/16 demonstrated EF 40-45. He is NYHA 1-2. Continue beta blocker, ACE inhibitor, aldosterone antagonist. Recent labs with PCP included creatinine, potassium. These were normal. Arrange follow-up with Dr. Saunders Revel in 6 months. I will obtain an echocardiogram prior to that visit for follow-up on his cardiomyopathy  2. Essential hypertension The patient's blood pressure is controlled on his current regimen.  Continue current therapy.    3. Leg edema, right  Etiology not clear.   He is not volume overloaded.  He denies pain.  DVT seems unlikely but should be ruled out.  -  Plan: VAS [place] LOWER EXTREMITY VENOUS (DVT)    Dispo:  Return in about 6 months (around 01/25/2018) for Routine Follow Up, w/ Dr. Saunders Revel.   Medication Adjustments/Labs and Tests Ordered: Current medicines are reviewed at length with the patient today.  Concerns regarding medicines are outlined above.  Tests Ordered: Orders Placed This Encounter  Procedures  . EKG 12-Lead  . ECHOCARDIOGRAM COMPLETE   Medication Changes: Meds ordered this encounter  Medications  . carvedilol (COREG) 25 MG tablet    Sig: Take 1 tablet (25 mg total) by mouth 2 (two) times daily.    Dispense:  60 tablet    Refill:  11    Prescription renewal    Order Specific Question:   Supervising Provider    Answer:   Sueanne Margarita [id number]  . lisinopril (PRINIVIL,ZESTRIL) 20 MG tablet    Sig: Take 1 tablet (20 mg total) by mouth daily.    Dispense:  30 tablet    Refill:  11    Prescription renewal    Order Specific Question:   Supervising Provider    Answer:   Sueanne Margarita [id number]  . spironolactone (ALDACTONE) 25 MG tablet    Sig: Take 1 tablet (25 mg total) by mouth daily.    Dispense:  30 tablet    Refill:  11    Prescription renewal    Order Specific Question:   Supervising Provider    Answer:   Sueanne Margarita [[id number]]    Signed, Richardson Dopp, PA-C  07/25/2017 10:14 AM    [facility] [place] [address], [place], [place]  [address] Phone: [phone]; Fax: [phone]

## 2017-07-25 ENCOUNTER — Encounter: Payer: Self-pay | Admitting: Physician Assistant

## 2017-07-25 ENCOUNTER — Ambulatory Visit (INDEPENDENT_AMBULATORY_CARE_PROVIDER_SITE_OTHER): Payer: PPO | Admitting: Physician Assistant

## 2017-07-25 ENCOUNTER — Encounter (INDEPENDENT_AMBULATORY_CARE_PROVIDER_SITE_OTHER): Payer: Self-pay

## 2017-07-25 VITALS — BP 100/70 | HR 82 | Ht 70.0 in | Wt 244.0 lb

## 2017-07-25 DIAGNOSIS — I10 Essential (primary) hypertension: Secondary | ICD-10-CM | POA: Diagnosis not present

## 2017-07-25 DIAGNOSIS — R600 Localized edema: Secondary | ICD-10-CM

## 2017-07-25 DIAGNOSIS — I428 Other cardiomyopathies: Secondary | ICD-10-CM | POA: Diagnosis not present

## 2017-07-25 MED ORDER — SPIRONOLACTONE 25 MG PO TABS: 25.0000 mg | ORAL_TABLET | Freq: Every day | ORAL | 11 refills | Status: DC

## 2017-07-25 MED ORDER — CARVEDILOL 25 MG PO TABS: 25.0000 mg | ORAL_TABLET | Freq: Two times a day (BID) | ORAL | 11 refills | Status: DC

## 2017-07-25 MED ORDER — LISINOPRIL 20 MG PO TABS: 20.0000 mg | ORAL_TABLET | Freq: Every day | ORAL | 11 refills | Status: DC

## 2017-07-25 NOTE — Patient Instructions (Addendum)
Medication Instructions:  1. Your physician recommends that you continue on your current medications as directed. Please refer to the Current Medication list given to you today.   Labwork: NONE ORDERED   Testing/Procedures: 1. Your physician has requested that you have an echocardiogram.  THIS IS TO BE DONE IN 6 MONTHS ABOUT 1 WEEK BEFORE APPT WITH DR. END. Echocardiography is a painless test that uses sound waves to create images of your heart. It provides your doctor with information about the size and shape of your heart and how well your heart's chambers and valves are working. This procedure takes approximately one hour. There are no restrictions for this procedure.  2. Your physician has requested that you have a lower extremity venous duplex. This test is an ultrasound of the veins in the legs or arms. It looks at venous blood flow that carries blood from the heart to the legs or arms. Allow one hour for a Lower Venous exam. Allow thirty minutes for an Upper Venous exam. There are no restrictions or special instructions.    Follow-Up: DR. END IN 6 MONTHS; OUR OFFICE WILL SEND OUT A REMINDER LETTER IN A FEW MONTHS TO HAVE YOU CALL AND MAKE AN APPT  Any Other Special Instructions Will Be Listed Below (If Applicable).     If you need a refill on your cardiac medications before your next appointment, please call your pharmacy.

## 2017-08-01 ENCOUNTER — Ambulatory Visit (HOSPITAL_COMMUNITY)
Admission: RE | Admit: 2017-08-01 | Discharge: 2017-08-01 | Disposition: A | Discharge status: Home / Self Care | Payer: PPO | Source: Ambulatory Visit | Attending: Cardiovascular Disease | Admitting: Cardiovascular Disease

## 2017-08-01 DIAGNOSIS — M7989 Other specified soft tissue disorders: Secondary | ICD-10-CM | POA: Diagnosis not present

## 2017-08-01 DIAGNOSIS — I10 Essential (primary) hypertension: Secondary | ICD-10-CM | POA: Insufficient documentation

## 2017-08-01 DIAGNOSIS — Z87891 Personal history of nicotine dependence: Secondary | ICD-10-CM | POA: Diagnosis not present

## 2017-08-01 DIAGNOSIS — E785 Hyperlipidemia, unspecified: Secondary | ICD-10-CM | POA: Diagnosis not present

## 2017-08-01 DIAGNOSIS — R600 Localized edema: Secondary | ICD-10-CM | POA: Insufficient documentation

## 2017-08-02 ENCOUNTER — Telehealth: Payer: Self-pay | Admitting: *Deleted

## 2017-08-02 DIAGNOSIS — M7121 Synovial cyst of popliteal space [Baker], right knee: Secondary | ICD-10-CM

## 2017-08-02 NOTE — Telephone Encounter (Signed)
Left message to go over Venous Doppler

## 2017-08-02 NOTE — Telephone Encounter (Signed)
-----   Message from Liliane Shi, [place] sent at 08/02/2017  1:21 PM EDT ----- Please call the patient. No DVT. There is a mass in the R popliteal space suspicious for Baker's cyst >> refer to orthopedics.  Please fax a copy of this study result to his PCP:  Shon Baton, MD  Thanks! Richardson Dopp, PA-C    08/02/2017 1:21 PM

## 2017-08-03 NOTE — Telephone Encounter (Signed)
Pt has been notified of Venous US, no DVT. Pt aware there is a mass in the R popliteal space suspicious for Baker's cyst >> refer to orthopedics. Pt states he had seen Dr. Ninfa Linden in the past at [facility]. I will place referral in Epic today. Pt aware Orthopedic office will call him to schedule an appt. Pt thanked me for my call and my help today.

## 2017-08-03 NOTE — Telephone Encounter (Signed)
-----   Message from Liliane Shi, [place] sent at 08/02/2017  1:21 PM EDT ----- Please call the patient. No DVT. There is a mass in the R popliteal space suspicious for Baker's cyst >> refer to orthopedics.  Please fax a copy of this study result to his PCP:  Shon Baton, MD  Thanks! Richardson Dopp, PA-C    08/02/2017 1:21 PM

## 2017-11-02 DIAGNOSIS — Z23 Encounter for immunization: Secondary | ICD-10-CM | POA: Diagnosis not present

## 2017-11-02 DIAGNOSIS — I5022 Chronic systolic (congestive) heart failure: Secondary | ICD-10-CM | POA: Diagnosis not present

## 2017-11-02 DIAGNOSIS — Z6836 Body mass index (BMI) 36.0-36.9, adult: Secondary | ICD-10-CM | POA: Diagnosis not present

## 2017-11-02 DIAGNOSIS — G6289 Other specified polyneuropathies: Secondary | ICD-10-CM | POA: Diagnosis not present

## 2017-11-02 DIAGNOSIS — R600 Localized edema: Secondary | ICD-10-CM | POA: Diagnosis not present

## 2017-11-02 DIAGNOSIS — I110 Hypertensive heart disease with heart failure: Secondary | ICD-10-CM | POA: Diagnosis not present

## 2018-01-25 ENCOUNTER — Ambulatory Visit (HOSPITAL_COMMUNITY): Payer: PPO | Attending: Internal Medicine

## 2018-01-25 ENCOUNTER — Other Ambulatory Visit: Payer: Self-pay

## 2018-01-25 VITALS — BP 120/81

## 2018-01-25 DIAGNOSIS — I428 Other cardiomyopathies: Secondary | ICD-10-CM | POA: Diagnosis not present

## 2018-01-25 DIAGNOSIS — I10 Essential (primary) hypertension: Secondary | ICD-10-CM | POA: Diagnosis not present

## 2018-01-25 DIAGNOSIS — I340 Nonrheumatic mitral (valve) insufficiency: Secondary | ICD-10-CM | POA: Insufficient documentation

## 2018-01-25 MED ORDER — PERFLUTREN LIPID MICROSPHERE
1.0000 mL | INTRAVENOUS | Status: AC | PRN
  Administered 2018-01-25: 1 mL via INTRAVENOUS

## 2018-01-30 ENCOUNTER — Encounter: Payer: Self-pay | Admitting: Physician Assistant

## 2018-01-31 ENCOUNTER — Telehealth: Payer: Self-pay | Admitting: *Deleted

## 2018-01-31 ENCOUNTER — Telehealth: Payer: Self-pay | Admitting: Physician Assistant

## 2018-01-31 DIAGNOSIS — I428 Other cardiomyopathies: Secondary | ICD-10-CM

## 2018-01-31 NOTE — Telephone Encounter (Signed)
Richardson Dopp, PA reviewed results with the pt by phone today. Pt verbalized understanding to results given. Pt advised need Cardiac MRI, pt is agreeable to Cardiac MRI. Pt aware scheduler will call him and schedule MRI which will be done at [facility].  PA discussed with the pt possible cath and that he will need an appt to further discuss. Pt has been scheduled to see Richardson Dopp, PA 02/20/18 @ 11:15. Pt thanked me for my call.

## 2018-01-31 NOTE — Telephone Encounter (Signed)
Left message to go over echo results and recommendations. I will place order for Cardiac MRI.

## 2018-01-31 NOTE — Telephone Encounter (Signed)
-----   Message from Liliane Shi, [place] sent at 01/30/2018  6:01 PM EST ----- Please call the patient. I reviewed his echo with Dr. Harrell Gave End. His EF is worse again.  It was 40-45% by echo in 3/16.  It is now back to 25-30%. I would like him to have an MRI to better quantify his ejection fraction. PLAN:  1. Arrange cardiac MRI Please fax a copy of this study result to his PCP:  Shon Baton, MD  Thanks! Richardson Dopp, PA-C    01/30/2018 5:57 PM

## 2018-01-31 NOTE — Telephone Encounter (Signed)
Called patient and LVM to call back to let me know what time of day he wants his cardiac MRI.

## 2018-02-05 ENCOUNTER — Encounter: Payer: Self-pay | Admitting: Physician Assistant

## 2018-02-05 ENCOUNTER — Telehealth: Payer: Self-pay | Admitting: Physician Assistant

## 2018-02-05 NOTE — Telephone Encounter (Signed)
Called patient a second time and left a VM on his home/work number.

## 2018-02-09 ENCOUNTER — Encounter: Payer: Self-pay | Admitting: Physician Assistant

## 2018-02-20 ENCOUNTER — Ambulatory Visit (INDEPENDENT_AMBULATORY_CARE_PROVIDER_SITE_OTHER): Payer: PPO | Admitting: Physician Assistant

## 2018-02-20 ENCOUNTER — Encounter: Payer: Self-pay | Admitting: Physician Assistant

## 2018-02-20 VITALS — BP 112/68 | HR 79 | Ht 70.0 in | Wt 249.0 lb

## 2018-02-20 DIAGNOSIS — M25472 Effusion, left ankle: Secondary | ICD-10-CM | POA: Diagnosis not present

## 2018-02-20 DIAGNOSIS — I420 Dilated cardiomyopathy: Secondary | ICD-10-CM | POA: Diagnosis not present

## 2018-02-20 DIAGNOSIS — I10 Essential (primary) hypertension: Secondary | ICD-10-CM

## 2018-02-20 NOTE — Patient Instructions (Addendum)
Medication Instructions:  Your physician recommends that you continue on your current medications as directed. Please refer to the Current Medication list given to you today.   Labwork: March 05, 2018; LAB IS OPEN FROM 7:30 AM-4:30 PM ; YOU MAY COME IN ANYTIME ON THIS DAY; THIS IS NOT FASTING LAB WORK, SO YOU MAY HAVE BREAKSFAST THAT DAY.   Testing/Procedures: Your physician has requested that you have a cardiac catheterization. Cardiac catheterization is used to diagnose and/or treat various heart conditions. Doctors may recommend this procedure for a number of different reasons. The most common reason is to evaluate chest pain. Chest pain can be a symptom of coronary artery disease (CAD), and cardiac catheterization can show whether plaque is narrowing or blocking your heart's arteries. This procedure is also used to evaluate the valves, as well as measure the blood flow and oxygen levels in different parts of your heart. For further information please visit [email or web address][facility][email or web address]. Please follow instruction sheet, as given.    Follow-Up: March 26, 2018 @ 10:45 AM WITH SCOTT WEAVER, PAC; THIS WILL BE YOUR FOLLOW UP POST PROCEDURE APPT    Any Other Special Instructions Will Be Listed Below (If Applicable).   If you need a refill on your cardiac medications before your next appointment, please call your pharmacy.    [facility] OFF[facility] [address] [place] [address] [place] [address] Dept: [phone] Loc: [phone]  ABDULWAHAB DEMELO  02/20/2018  You are scheduled for a Cardiac Catheterization on Friday, April 12 with Dr. Harrell Gave End.  1. Please arrive at the [facility] (Main Entrance A) at [facility]: [place], [place] [address] at 10:00 AM (two hours before your procedure to ensure your preparation). Free valet parking service is available.   Special note: Every effort is  made to have your procedure done on time. Please understand that emergencies sometimes delay scheduled procedures.  2. Diet: Do not eat or drink anything after midnight prior to your procedure except sips of water to take medications.  3. Labs: You will need to have blood drawn on Monday, April 8 at [facility]. [address]  Open: 7:30am - 5pm    Phone: [phone]. You do not need to be fasting.  4. Medication instructions in preparation for your procedure: On the morning of your procedure, take your Aspirin 81 MG and any morning medicines.  You may use SMALL sips of water.  5. Plan for one night stay--bring personal belongings. 6. Bring a current list of your medications and current insurance cards. 7. You MUST have a responsible person to drive you home. 8. Someone MUST be with you the first 24 hours after you arrive home or your discharge will be delayed. 9. Please wear clothes that are easy to get on and off and wear slip-on shoes.  Thank you for allowing [place] to care for you!   -- [facility]

## 2018-02-20 NOTE — H&P (View-Only) (Signed)
Cardiology Office Note:    Date:  02/20/2018   ID:  Kevin Murillo, DOB 18-Mar-1946, MRN [id number]  PCP:  Shon Baton, MD  Cardiologist:  Nelva Bush, MD   Referring MD: Shon Baton, MD   Chief Complaint  Patient presents with  . Follow-up    Cardiomyopathy    History of Present Illness:    Kevin Murillo is a 72 y.o. male with hypertension, dilated cardiomyopathy.  Cardiac catheterization 2011 demonstrated no angiographic CAD.  EF was previously 25-30.  Cardiopulmonary stress test in March 2015 demonstrated normal functional capacity.  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  As he has been NYHA 1 in the past, ICD has not been recommended.  EF in 4/16 was 40-45% by echocardiogram.  He was last seen August 2018.  A follow-up echocardiogram in February 2019 demonstrated worsening LV function with an EF of 25-30%.  I reviewed this with Dr. Saunders Revel.  We felt that the patient needed cardiac catheterization to rule out the development of coronary artery disease as a cause for his worsening LV function.  We have also arranged for the patient to undergo cardiac MRI to further evaluate for non-compaction.  [name] returns to discuss proceeding with cardiac catheterization.  Since last seen, he has been doing well. He denies chest pain, shortness of breath, syncope, paroxysmal nocturnal dyspnea.  He developed L ankle edema 2 days ago.  He denies any injury.  No recent hospitalization or leg injury.    Prior CV studies:   The following studies were reviewed today:  Echo 01/17/18 Mild LVH, EF 25-30, diffuse HK, mild MR, mild LAE, no PFO  CPX 3/16 Conclusion: Exercise testing with gas-exchange demonstrates a low-normal functional capacity when compared to matched sedentary norms. The patient's limitation appears to be  multifactorial in nature including mild HF, obesity, a hypertensive response to exercise and possible orthopedic limitations. .   Echo 02/04/15 - Left ventricle: The cavity size was normal. Wall thickness was increased in a pattern of mild LVH. Systolic function was mildly to moderately reduced. The estimated ejection fraction was in the range of 40% to 45%. Akinesis of the basalanteroseptal myocardium. Doppler parameters are consistent with abnormal left ventricular relaxation (grade 1 diastolic dysfunction). - Left atrium: The atrium was moderately dilated.  LHC 7/11 EF 30-35% No CAD  Past Medical History:  Diagnosis Date  . Chronic systolic CHF (congestive heart failure), NYHA class 1 ([place])   . History of echocardiogram    Echo 3/16: Mild LVH, EF 40-45%, anteroseptal akinesis, grade 1 diastolic dysfunction, moderate LAE // Echo 3/19: diff HK worse in basal and mid inf wall, mild LVH, EF 25-30, mild MR, mild LAE  . Hyperlipidemia   . Hypertension   . Neuropathy of hand   . Non-ischemic cardiomyopathy ([place])    a. Echo 6/11: 30-35% // b. LHC 7/11: EF 30-35% with diffuse hypokinesis, no angiographic CAD. SPEP and Fe studies unremarkable, TSH normal.//  c. Echo 1/12: EF 45% // d. Echo 6/14: EF 20-25%  //  e. Echo EF 25-30%, diffuse hypokinesis, prominent apical trabeculation // f. CPX 3/15: normal fxnal capacity  // g. CPX 3/16 low norma fxnal capacity  // h. Echo 3/16: mild LVH, EF 40-45%   . Obesity   . PVC (premature ventricular contraction)   . Rosacea   . Tubular adenoma of  colon 12/2010  1. HTN  2. Left hand neuropathy  3. Nonischemic cardiomyopathy: Echo (6/11) with EF 30-35%, posteroinferior severe hypokinesis, moderate diastolic dysfunction, mild left atrial enlargement, mild LV hypertrophy. LHC (7/11): EF 30-35% with diffuse hypokinesis, no angiographic CAD. SPEP and Fe studies unremarkable, TSH normal.Repeat echo (1/12) with EF 45%, mild global hypokinesis,  mild LVH, normal RV size and systolic function.Echo (6/14) with EF 20-25%, diffuse hypokinesis, mild LV dilation. Echo (1/15) with mild LV dilation, EF 25-30%, diffuse hypokinesis, prominent apical trabeculation, mildly dilated RV. CPX (3/15) with VO2 max 20.1, VE/VCO2 slope 34.7 (near normal when adjusted to respiratory compensation point) =>this CPX showed normal functional capacity for his age.  4. Hyperlipidemia   Surgical Hx: The patient  has a past surgical history that includes Knee arthroscopy (Left); Colonoscopy; and ARM SURGERY (1967).   Current Medications: Current Meds  Medication Sig  . aspirin 81 MG tablet Take 81 mg by mouth daily.  . carvedilol (COREG) 25 MG tablet Take 1 tablet (25 mg total) by mouth 2 (two) times daily.  Marland Kitchen lisinopril (PRINIVIL,ZESTRIL) 20 MG tablet Take 1 tablet (20 mg total) by mouth daily.  . simvastatin (ZOCOR) 40 MG tablet Take 40 mg by mouth daily at 6 PM.   . spironolactone (ALDACTONE) 25 MG tablet Take 1 tablet (25 mg total) by mouth daily.     Allergies:   Patient has no known allergies.   Social History   Tobacco Use  . Smoking status: Former Smoker    Types: Cigarettes    Last attempt to quit: 03/12/2002    Years since quitting: 15.9  . Smokeless tobacco: Never Used  Substance Use Topics  . Alcohol use: Yes    Comment: Socially  . Drug use: No     Family Hx: The patient's family history includes CVA in his mother; Gout in his son; Heart attack in his unknown relative. There is no history of Colon cancer.  ROS:   Please see the history of present illness.    Review of Systems  Cardiovascular: Positive for leg swelling (ankle swelling).   All other systems reviewed and are negative.   EKGs/Labs/Other Test Reviewed:    EKG:  EKG is  ordered today.  The ekg ordered today demonstrates normal sinus rhythm, heart rate 79, left axis deviation, septal and lateral Q waves, T wave inversions 2, 3, aVF, V5-V6, QTC 451, similar to prior  tracings  Recent Labs: No results found for requested labs within last 8760 hours.   Recent Lipid Panel Lab Results  Component Value Date/Time   CHOL 144 08/03/2010 08:28 AM   TRIG 145.0 08/03/2010 08:28 AM   HDL 48.00 08/03/2010 08:28 AM   CHOLHDL 3 08/03/2010 08:28 AM   LDLCALC 67 08/03/2010 08:28 AM   LDLDIRECT 121.7 05/28/2010 08:46 AM    Physical Exam:    VS:  BP 112/68   Pulse 79   Ht 5\' 10"  (1.778 m)   Wt 249 lb (112.9 kg)   SpO2 96%   BMI 35.73 kg/m     Wt Readings from Last 3 Encounters:  02/20/18 249 lb (112.9 kg)  07/25/17 244 lb (110.7 kg)  05/20/16 241 lb 6.4 oz (109.5 kg)     Physical Exam  Constitutional: He is oriented to person, place, and time. He appears well-developed and well-nourished. No distress.  HENT:  Head: Normocephalic and atraumatic.  Neck: No JVD present.  Cardiovascular: Normal rate and regular rhythm.  No murmur heard. Pulmonary/Chest: Effort  normal. He has no rales.  Abdominal: Soft.  Musculoskeletal: He exhibits edema (trace-1+ L ankle edema).  L calf soft without tenderness  Neurological: He is alert and oriented to person, place, and time.  Skin: Skin is warm and dry.    ASSESSMENT & PLAN:    #1.  Dilated cardiomyopathy ([place]) Etiology has not been clear in the past.  Possible considerations included viral myocarditis versus hypertension versus LV non-compaction.  Echo in 2016 demonstrated EF 40-45%.  Recent echocardiogram demonstrated EF 25-30%.  He is NYHA 1-2.  As noted, I reviewed this with Dr. Saunders Revel.  We opted to set him up for cardiac catheterization to further evaluate for ischemic heart disease.  Risks and benefits of cardiac catheterization have been discussed with the patient.  These include bleeding, infection, kidney damage, stroke, heart attack, death.  The patient understands these risks and is willing to proceed.  He also has an MRI pending.  -Continue beta-blocker, ACE inhibitor, aldosterone antagonist  -Arrange  cardiac catheterization  -MRI pending  #2.  Essential hypertension The patient's blood pressure is controlled on his current regimen.  Continue current therapy.   #3.  Edema of left ankle Etiology not entirely clear.  Question if he has gout.  I have asked him to continue to monitor this and follow-up with primary care if there is no improvement in the next few days.   Dispo:  Return in about 1 month (around 03/20/2018) for Post Procedure Follow Up, w/ Dr. Saunders Revel, or Richardson Dopp, PA-C.   Medication Adjustments/Labs and Tests Ordered: Current medicines are reviewed at length with the patient today.  Concerns regarding medicines are outlined above.  Tests Ordered: Orders Placed This Encounter  Procedures  . Basic Metabolic Panel (BMET)  . CBC  . INR/PT  . EKG 12-Lead   Medication Changes: No orders of the defined types were placed in this encounter.   Signed, Richardson Dopp, PA-C  02/20/2018 3:55 PM    [facility] [address], [place], [place]  [address] Phone: [phone]; Fax: [phone]

## 2018-02-20 NOTE — Progress Notes (Signed)
Cardiology Office Note:    Date:  02/20/2018   ID:  Kevin Murillo, DOB 08-Aug-1946, MRN [id number]  PCP:  Shon Baton, MD  Cardiologist:  Nelva Bush, MD   Referring MD: Shon Baton, MD   Chief Complaint  Patient presents with  . Follow-up    Cardiomyopathy    History of Present Illness:    Kevin Murillo is a 72 y.o. male with hypertension, dilated cardiomyopathy.  Cardiac catheterization 2011 demonstrated no angiographic CAD.  EF was previously 25-30.  Cardiopulmonary stress test in March 2015 demonstrated normal functional capacity.  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  As he has been NYHA 1 in the past, ICD has not been recommended.  EF in 4/16 was 40-45% by echocardiogram.  He was last seen August 2018.  A follow-up echocardiogram in February 2019 demonstrated worsening LV function with an EF of 25-30%.  I reviewed this with Dr. Saunders Revel.  We felt that the patient needed cardiac catheterization to rule out the development of coronary artery disease as a cause for his worsening LV function.  We have also arranged for the patient to undergo cardiac MRI to further evaluate for non-compaction.  Kevin Murillo returns to discuss proceeding with cardiac catheterization.  Since last seen, he has been doing well. He denies chest pain, shortness of breath, syncope, paroxysmal nocturnal dyspnea.  He developed L ankle edema 2 days ago.  He denies any injury.  No recent hospitalization or leg injury.    Prior CV studies:   The following studies were reviewed today:  Echo 01/17/18 Mild LVH, EF 25-30, diffuse HK, mild MR, mild LAE, no PFO  CPX 3/16 Conclusion: Exercise testing with gas-exchange demonstrates a low-normal functional capacity when compared to matched sedentary norms. The patient's limitation appears to be  multifactorial in nature including mild HF, obesity, a hypertensive response to exercise and possible orthopedic limitations. .   Echo 02/04/15 - Left ventricle: The cavity size was normal. Wall thickness was increased in a pattern of mild LVH. Systolic function was mildly to moderately reduced. The estimated ejection fraction was in the range of 40% to 45%. Akinesis of the basalanteroseptal myocardium. Doppler parameters are consistent with abnormal left ventricular relaxation (grade 1 diastolic dysfunction). - Left atrium: The atrium was moderately dilated.  LHC 7/11 EF 30-35% No CAD  Past Medical History:  Diagnosis Date  . Chronic systolic CHF (congestive heart failure), NYHA class 1 ([place])   . History of echocardiogram    Echo 3/16: Mild LVH, EF 40-45%, anteroseptal akinesis, grade 1 diastolic dysfunction, moderate LAE // Echo 3/19: diff HK worse in basal and mid inf wall, mild LVH, EF 25-30, mild MR, mild LAE  . Hyperlipidemia   . Hypertension   . Neuropathy of hand   . Non-ischemic cardiomyopathy ([place])    a. Echo 6/11: 30-35% // b. LHC 7/11: EF 30-35% with diffuse hypokinesis, no angiographic CAD. SPEP and Fe studies unremarkable, TSH normal.//  c. Echo 1/12: EF 45% // d. Echo 6/14: EF 20-25%  //  e. Echo EF 25-30%, diffuse hypokinesis, prominent apical trabeculation // f. CPX 3/15: normal fxnal capacity  // g. CPX 3/16 low norma fxnal capacity  // h. Echo 3/16: mild LVH, EF 40-45%   . Obesity   . PVC (premature ventricular contraction)   . Rosacea   . Tubular adenoma of  colon 12/2010  1. HTN  2. Left hand neuropathy  3. Nonischemic cardiomyopathy: Echo (6/11) with EF 30-35%, posteroinferior severe hypokinesis, moderate diastolic dysfunction, mild left atrial enlargement, mild LV hypertrophy. LHC (7/11): EF 30-35% with diffuse hypokinesis, no angiographic CAD. SPEP and Fe studies unremarkable, TSH normal.Repeat echo (1/12) with EF 45%, mild global hypokinesis,  mild LVH, normal RV size and systolic function.Echo (6/14) with EF 20-25%, diffuse hypokinesis, mild LV dilation. Echo (1/15) with mild LV dilation, EF 25-30%, diffuse hypokinesis, prominent apical trabeculation, mildly dilated RV. CPX (3/15) with VO2 max 20.1, VE/VCO2 slope 34.7 (near normal when adjusted to respiratory compensation point) =>this CPX showed normal functional capacity for his age.  4. Hyperlipidemia   Surgical Hx: The patient  has a past surgical history that includes Knee arthroscopy (Left); Colonoscopy; and ARM SURGERY (1967).   Current Medications: Current Meds  Medication Sig  . aspirin 81 MG tablet Take 81 mg by mouth daily.  . carvedilol (COREG) 25 MG tablet Take 1 tablet (25 mg total) by mouth 2 (two) times daily.  Marland Kitchen lisinopril (PRINIVIL,ZESTRIL) 20 MG tablet Take 1 tablet (20 mg total) by mouth daily.  . simvastatin (ZOCOR) 40 MG tablet Take 40 mg by mouth daily at 6 PM.   . spironolactone (ALDACTONE) 25 MG tablet Take 1 tablet (25 mg total) by mouth daily.     Allergies:   Patient has no known allergies.   Social History   Tobacco Use  . Smoking status: Former Smoker    Types: Cigarettes    Last attempt to quit: 03/12/2002    Years since quitting: 15.9  . Smokeless tobacco: Never Used  Substance Use Topics  . Alcohol use: Yes    Comment: Socially  . Drug use: No     Family Hx: The patient's family history includes CVA in his mother; Gout in his son; Heart attack in his unknown relative. There is no history of Colon cancer.  ROS:   Please see the history of present illness.    Review of Systems  Cardiovascular: Positive for leg swelling (ankle swelling).   All other systems reviewed and are negative.   EKGs/Labs/Other Test Reviewed:    EKG:  EKG is  ordered today.  The ekg ordered today demonstrates normal sinus rhythm, heart rate 79, left axis deviation, septal and lateral Q waves, T wave inversions 2, 3, aVF, V5-V6, QTC 451, similar to prior  tracings  Recent Labs: No results found for requested labs within last 8760 hours.   Recent Lipid Panel Lab Results  Component Value Date/Time   CHOL 144 08/03/2010 08:28 AM   TRIG 145.0 08/03/2010 08:28 AM   HDL 48.00 08/03/2010 08:28 AM   CHOLHDL 3 08/03/2010 08:28 AM   LDLCALC 67 08/03/2010 08:28 AM   LDLDIRECT 121.7 05/28/2010 08:46 AM    Physical Exam:    VS:  BP 112/68   Pulse 79   Ht 5\' 10"  (1.778 m)   Wt 249 lb (112.9 kg)   SpO2 96%   BMI 35.73 kg/m     Wt Readings from Last 3 Encounters:  02/20/18 249 lb (112.9 kg)  07/25/17 244 lb (110.7 kg)  05/20/16 241 lb 6.4 oz (109.5 kg)     Physical Exam  Constitutional: He is oriented to person, place, and time. He appears well-developed and well-nourished. No distress.  HENT:  Head: Normocephalic and atraumatic.  Neck: No JVD present.  Cardiovascular: Normal rate and regular rhythm.  No murmur heard. Pulmonary/Chest: Effort  normal. He has no rales.  Abdominal: Soft.  Musculoskeletal: He exhibits edema (trace-1+ L ankle edema).  L calf soft without tenderness  Neurological: He is alert and oriented to person, place, and time.  Skin: Skin is warm and dry.    ASSESSMENT & PLAN:    #1.  Dilated cardiomyopathy ([place]) Etiology has not been clear in the past.  Possible considerations included viral myocarditis versus hypertension versus LV non-compaction.  Echo in 2016 demonstrated EF 40-45%.  Recent echocardiogram demonstrated EF 25-30%.  He is NYHA 1-2.  As noted, I reviewed this with Dr. Saunders Revel.  We opted to set him up for cardiac catheterization to further evaluate for ischemic heart disease.  Risks and benefits of cardiac catheterization have been discussed with the patient.  These include bleeding, infection, kidney damage, stroke, heart attack, death.  The patient understands these risks and is willing to proceed.  He also has an MRI pending.  -Continue beta-blocker, ACE inhibitor, aldosterone antagonist  -Arrange  cardiac catheterization  -MRI pending  #2.  Essential hypertension The patient's blood pressure is controlled on his current regimen.  Continue current therapy.   #3.  Edema of left ankle Etiology not entirely clear.  Question if he has gout.  I have asked him to continue to monitor this and follow-up with primary care if there is no improvement in the next few days.   Dispo:  Return in about 1 month (around 03/20/2018) for Post Procedure Follow Up, w/ Dr. Saunders Revel, or Richardson Dopp, PA-C.   Medication Adjustments/Labs and Tests Ordered: Current medicines are reviewed at length with the patient today.  Concerns regarding medicines are outlined above.  Tests Ordered: Orders Placed This Encounter  Procedures  . Basic Metabolic Panel (BMET)  . CBC  . INR/PT  . EKG 12-Lead   Medication Changes: No orders of the defined types were placed in this encounter.   Signed, Richardson Dopp, PA-C  02/20/2018 3:55 PM    Kevin Murillo, [place], [place]  [id number] Phone: [phone]; Fax: [phone]

## 2018-02-26 ENCOUNTER — Ambulatory Visit (HOSPITAL_COMMUNITY)
Admission: RE | Admit: 2018-02-26 | Discharge: 2018-02-26 | Disposition: A | Discharge status: Home / Self Care | Payer: PPO | Source: Ambulatory Visit | Attending: Physician Assistant | Admitting: Physician Assistant

## 2018-02-26 DIAGNOSIS — I081 Rheumatic disorders of both mitral and tricuspid valves: Secondary | ICD-10-CM | POA: Insufficient documentation

## 2018-02-26 DIAGNOSIS — I428 Other cardiomyopathies: Secondary | ICD-10-CM | POA: Diagnosis not present

## 2018-02-26 LAB — POCT I-STAT CREATININE: CREATININE: 1.2 mg/dL (ref 0.61–1.24)

## 2018-02-26 MED ORDER — GADOBENATE DIMEGLUMINE 529 MG/ML IV SOLN
38.0000 mL | Freq: Once | INTRAVENOUS | Status: AC | PRN
  Administered 2018-02-26: 38 mL via INTRAVENOUS

## 2018-02-27 ENCOUNTER — Telehealth: Payer: Self-pay | Admitting: *Deleted

## 2018-02-27 NOTE — Telephone Encounter (Signed)
Pt has been notified of lab results by phone with verbal understanding. Pt thanked me for my call.  

## 2018-02-27 NOTE — Telephone Encounter (Signed)
-----   Message from Liliane Shi, [place] sent at 02/26/2018  9:28 PM EDT ----- Creatinine normal. Continue current medications and follow up as planned.  Richardson Dopp, PA-C    02/26/2018 9:28 PM

## 2018-02-27 NOTE — Telephone Encounter (Signed)
Left message to go over lab results.  

## 2018-02-28 ENCOUNTER — Telehealth: Payer: Self-pay | Admitting: *Deleted

## 2018-02-28 NOTE — Telephone Encounter (Signed)
Per Richardson Dopp, PAC d/w with Dr. Aundra Dubin cath scheduled for 03/09/18. Per Dr. Aundra Dubin he would like to to do the cath. Per Dr. Aundra Dubin cath will be changed to a Right and Left Heart Cath. Cath will either need to be at 7:30 am or 12:30 per Dr. Aundra Dubin. I s/w Crystal in the cath lab who has changed cath to R and L with Dr. Aundra Dubin @ 12:30 on 03/09/18. Pt has been called and made aware of time change as well as Dr. Aundra Dubin will be doing the cath now. Pt aware to arrive by 10:30 am for procedure 12:30 pm. Pt thanked me for my call with the update. I will also notify Richardson Dopp, PA and Dr. Aundra Dubin cath change has been made.

## 2018-03-01 ENCOUNTER — Telehealth (HOSPITAL_COMMUNITY): Payer: Self-pay | Admitting: Cardiology

## 2018-03-01 NOTE — Telephone Encounter (Signed)
Called and left message for patient to call back, need to give pt appt info for f/u with  Dr. Aundra Dubin post cath per Shirley Muscat, RN.

## 2018-03-05 ENCOUNTER — Other Ambulatory Visit: Payer: PPO | Admitting: *Deleted

## 2018-03-05 DIAGNOSIS — I420 Dilated cardiomyopathy: Secondary | ICD-10-CM | POA: Diagnosis not present

## 2018-03-05 LAB — CBC
HEMOGLOBIN: 12.8 g/dL — AB (ref 13.0–17.7)
Hematocrit: 38.1 % (ref 37.5–51.0)
MCH: 30.7 pg (ref 26.6–33.0)
MCHC: 33.6 g/dL (ref 31.5–35.7)
MCV: 91 fL (ref 79–97)
Platelets: 303 10*3/uL (ref 150–379)
RBC: 4.17 x10E6/uL (ref 4.14–5.80)
RDW: 12.5 % (ref 12.3–15.4)
WBC: 7 10*3/uL (ref 3.4–10.8)

## 2018-03-05 LAB — BASIC METABOLIC PANEL
BUN / CREAT RATIO: 17 (ref 10–24)
BUN: 18 mg/dL (ref 8–27)
CO2: 24 mmol/L (ref 20–29)
CREATININE: 1.06 mg/dL (ref 0.76–1.27)
Calcium: 9.8 mg/dL (ref 8.6–10.2)
Chloride: 101 mmol/L (ref 96–106)
GFR calc non Af Amer: 70 mL/min/{1.73_m2} (ref >59)
GFR, EST AFRICAN AMERICAN: 81 mL/min/{1.73_m2} (ref >59)
Glucose: 80 mg/dL (ref 65–99)
Potassium: 4.4 mmol/L (ref 3.5–5.2)
Sodium: 140 mmol/L (ref 134–144)

## 2018-03-05 LAB — PROTIME-INR
INR: 1 (ref 0.8–1.2)
Prothrombin Time: 10.4 s (ref 9.1–12.0)

## 2018-03-06 ENCOUNTER — Telehealth: Payer: Self-pay | Admitting: *Deleted

## 2018-03-06 NOTE — Telephone Encounter (Signed)
Pt has been notified of lab results and ok to proceed with cath on 4/121/19 with Dr. Aundra Dubin. Pt thanked me for my call.

## 2018-03-06 NOTE — Telephone Encounter (Signed)
-----   Message from Liliane Shi, PA-C sent at 03/06/2018  4:35 PM EDT ----- Labs okay for cardiac catheterization.  Proceed as planned. Richardson Dopp, PA-C    03/06/2018 4:34 PM

## 2018-03-07 ENCOUNTER — Telehealth: Payer: Self-pay | Admitting: *Deleted

## 2018-03-07 NOTE — Telephone Encounter (Signed)
Pt contacted pre-catheterization scheduled at [facility] for: Friday March 09, 2018 12:30 PM Verified arrival time and place: [place] Entrance A/[place] at: 10:30 AM Nothing to eat or drink after midnight prior to cath. Verified no known allergies. Verified no diabetes medications  Hold: Spironolactone AM of cath.  AM meds can be  taken pre-cath with sip of water including: ASA 81 mg   Confirmed patient has responsible person to drive home post procedure and observe patient for 24 hours: yes

## 2018-03-09 ENCOUNTER — Ambulatory Visit (HOSPITAL_COMMUNITY)
Admission: RE | Admit: 2018-03-09 | Discharge: 2018-03-09 | Disposition: A | Discharge status: Home / Self Care | Payer: PPO | Source: Ambulatory Visit | Attending: Cardiology | Admitting: Cardiology

## 2018-03-09 ENCOUNTER — Ambulatory Visit (HOSPITAL_COMMUNITY)
Admission: RE | Disposition: A | Discharge status: Home / Self Care | Payer: Self-pay | Source: Ambulatory Visit | Attending: Cardiology

## 2018-03-09 DIAGNOSIS — Z7982 Long term (current) use of aspirin: Secondary | ICD-10-CM | POA: Diagnosis not present

## 2018-03-09 DIAGNOSIS — I420 Dilated cardiomyopathy: Secondary | ICD-10-CM

## 2018-03-09 DIAGNOSIS — Z87891 Personal history of nicotine dependence: Secondary | ICD-10-CM | POA: Diagnosis not present

## 2018-03-09 DIAGNOSIS — I2721 Secondary pulmonary arterial hypertension: Secondary | ICD-10-CM | POA: Insufficient documentation

## 2018-03-09 DIAGNOSIS — I493 Ventricular premature depolarization: Secondary | ICD-10-CM | POA: Diagnosis not present

## 2018-03-09 DIAGNOSIS — I509 Heart failure, unspecified: Secondary | ICD-10-CM | POA: Diagnosis not present

## 2018-03-09 DIAGNOSIS — Z8249 Family history of ischemic heart disease and other diseases of the circulatory system: Secondary | ICD-10-CM | POA: Insufficient documentation

## 2018-03-09 DIAGNOSIS — I110 Hypertensive heart disease with heart failure: Secondary | ICD-10-CM | POA: Diagnosis not present

## 2018-03-09 DIAGNOSIS — I5022 Chronic systolic (congestive) heart failure: Secondary | ICD-10-CM | POA: Insufficient documentation

## 2018-03-09 DIAGNOSIS — E669 Obesity, unspecified: Secondary | ICD-10-CM | POA: Insufficient documentation

## 2018-03-09 DIAGNOSIS — E785 Hyperlipidemia, unspecified: Secondary | ICD-10-CM | POA: Diagnosis not present

## 2018-03-09 HISTORY — PX: RIGHT/LEFT HEART CATH AND CORONARY ANGIOGRAPHY: CATH118266

## 2018-03-09 LAB — POCT I-STAT 3, VENOUS BLOOD GAS (G3P V)
Acid-base deficit: 1 mmol/L (ref 0.0–2.0)
Bicarbonate: 25.4 mmol/L (ref 20.0–28.0)
Bicarbonate: 26.2 mmol/L (ref 20.0–28.0)
O2 Saturation: 60 %
O2 Saturation: 62 %
PCO2 VEN: 46.2 mmHg (ref 44.0–60.0)
PCO2 VEN: 48.1 mmHg (ref 44.0–60.0)
PH VEN: 7.343 (ref 7.250–7.430)
PO2 VEN: 33 mmHg (ref 32.0–45.0)
PO2 VEN: 34 mmHg (ref 32.0–45.0)
TCO2: 27 mmol/L (ref 22–32)
TCO2: 28 mmol/L (ref 22–32)
pH, Ven: 7.348 (ref 7.250–7.430)

## 2018-03-09 SURGERY — RIGHT/LEFT HEART CATH AND CORONARY ANGIOGRAPHY
Anesthesia: LOCAL

## 2018-03-09 MED ORDER — SODIUM CHLORIDE 0.9 % WEIGHT BASED INFUSION: 1.0000 mL/kg/h | INTRAVENOUS | Status: DC

## 2018-03-09 MED ORDER — SODIUM CHLORIDE 0.9% FLUSH: 3.0000 mL | Freq: Two times a day (BID) | INTRAVENOUS | Status: DC

## 2018-03-09 MED ORDER — HEPARIN (PORCINE) IN NACL 2-0.9 UNIT/ML-% IJ SOLN
INTRAMUSCULAR | Status: AC
  Filled 2018-03-09: qty 1000

## 2018-03-09 MED ORDER — LIDOCAINE HCL (PF) 1 % IJ SOLN
INTRAMUSCULAR | Status: DC | PRN
  Administered 2018-03-09: 5 mL

## 2018-03-09 MED ORDER — IOPAMIDOL (ISOVUE-370) INJECTION 76%
INTRAVENOUS | Status: DC | PRN
  Administered 2018-03-09: 100 mL via INTRA_ARTERIAL

## 2018-03-09 MED ORDER — VERAPAMIL HCL 2.5 MG/ML IV SOLN
INTRAVENOUS | Status: AC
  Filled 2018-03-09: qty 2

## 2018-03-09 MED ORDER — SODIUM CHLORIDE 0.9 % WEIGHT BASED INFUSION
3.0000 mL/kg/h | INTRAVENOUS | Status: AC
  Administered 2018-03-09: 3 mL/kg/h via INTRAVENOUS

## 2018-03-09 MED ORDER — SODIUM CHLORIDE 0.9 % IV SOLN: INTRAVENOUS | Status: DC

## 2018-03-09 MED ORDER — MIDAZOLAM HCL 2 MG/2ML IJ SOLN
INTRAMUSCULAR | Status: DC | PRN
  Administered 2018-03-09: 1 mg via INTRAVENOUS

## 2018-03-09 MED ORDER — FENTANYL CITRATE (PF) 100 MCG/2ML IJ SOLN
INTRAMUSCULAR | Status: DC | PRN
  Administered 2018-03-09 (×2): 25 ug via INTRAVENOUS

## 2018-03-09 MED ORDER — SODIUM CHLORIDE 0.9% FLUSH: 3.0000 mL | INTRAVENOUS | Status: DC | PRN

## 2018-03-09 MED ORDER — ONDANSETRON HCL 4 MG/2ML IJ SOLN: 4.0000 mg | Freq: Four times a day (QID) | INTRAMUSCULAR | Status: DC | PRN

## 2018-03-09 MED ORDER — HEPARIN SODIUM (PORCINE) 1000 UNIT/ML IJ SOLN
INTRAMUSCULAR | Status: AC
  Filled 2018-03-09: qty 1

## 2018-03-09 MED ORDER — MIDAZOLAM HCL 2 MG/2ML IJ SOLN
INTRAMUSCULAR | Status: AC
  Filled 2018-03-09: qty 2

## 2018-03-09 MED ORDER — IOPAMIDOL (ISOVUE-370) INJECTION 76%
INTRAVENOUS | Status: AC
  Filled 2018-03-09: qty 100

## 2018-03-09 MED ORDER — ACETAMINOPHEN 325 MG PO TABS: 650.0000 mg | ORAL_TABLET | ORAL | Status: DC | PRN

## 2018-03-09 MED ORDER — VERAPAMIL HCL 2.5 MG/ML IV SOLN
INTRAVENOUS | Status: DC | PRN
  Administered 2018-03-09: 10 mL via INTRA_ARTERIAL

## 2018-03-09 MED ORDER — HEPARIN (PORCINE) IN NACL 2-0.9 UNIT/ML-% IJ SOLN
INTRAMUSCULAR | Status: AC | PRN
  Administered 2018-03-09 (×2): 500 mL

## 2018-03-09 MED ORDER — HEPARIN SODIUM (PORCINE) 1000 UNIT/ML IJ SOLN
INTRAMUSCULAR | Status: DC | PRN
  Administered 2018-03-09: 5000 [IU] via INTRAVENOUS

## 2018-03-09 MED ORDER — SODIUM CHLORIDE 0.9 % IV SOLN: 250.0000 mL | INTRAVENOUS | Status: DC | PRN

## 2018-03-09 MED ORDER — ASPIRIN 81 MG PO CHEW: 81.0000 mg | CHEWABLE_TABLET | ORAL | Status: DC

## 2018-03-09 MED ORDER — LIDOCAINE HCL (PF) 1 % IJ SOLN
INTRAMUSCULAR | Status: AC
  Filled 2018-03-09: qty 30

## 2018-03-09 MED ORDER — FENTANYL CITRATE (PF) 100 MCG/2ML IJ SOLN
INTRAMUSCULAR | Status: AC
Start: 2018-03-09 — End: ?
  Filled 2018-03-09: qty 2

## 2018-03-09 SURGICAL SUPPLY — 14 items
BAND ZEPHYR COMPRESS 30 LONG (HEMOSTASIS) ×2 IMPLANT
CATH BALLN WEDGE 5F 110CM (CATHETERS) ×2 IMPLANT
CATH INFINITI 5FR MULTPACK ANG (CATHETERS) ×2 IMPLANT
GUIDEWIRE .025 260CM (WIRE) ×2 IMPLANT
GUIDEWIRE INQWIRE 1.5J.035X260 (WIRE) ×1 IMPLANT
INQWIRE 1.5J .035X260CM (WIRE) ×2
KIT HEART LEFT (KITS) ×2 IMPLANT
NEEDLE PERC 21GX4CM (NEEDLE) ×2 IMPLANT
PACK CARDIAC CATHETERIZATION (CUSTOM PROCEDURE TRAY) ×2 IMPLANT
SHEATH RAIN 4/5FR (SHEATH) ×2 IMPLANT
SHEATH RAIN RADIAL 21G 6FR (SHEATH) ×2 IMPLANT
SYR MEDRAD MARK V 150ML (SYRINGE) ×2 IMPLANT
TRANSDUCER W/STOPCOCK (MISCELLANEOUS) ×2 IMPLANT
TUBING CIL FLEX 10 FLL-RA (TUBING) ×2 IMPLANT

## 2018-03-09 NOTE — Interval H&P Note (Signed)
History and Physical Interval Note:  03/09/2018 1:13 PM  Kevin Murillo  has presented today for surgery, with the diagnosis of cm  The various methods of treatment have been discussed with the patient and family. After consideration of risks, benefits and other options for treatment, the patient has consented to  Procedure(s): RIGHT/LEFT HEART CATH AND CORONARY ANGIOGRAPHY (N/A) as a surgical intervention .  The patient's history has been reviewed, patient examined, no change in status, stable for surgery.  I have reviewed the patient's chart and labs.  Questions were answered to the patient's satisfaction.     Trei Schoch [employer]

## 2018-03-09 NOTE — Discharge Instructions (Signed)

## 2018-03-09 NOTE — Progress Notes (Signed)
Site area: left brachial  Site Prior to Removal:  Level 0  Pressure Applied For 15 MINUTES    Minutes Beginning at 1445  Manual:   Yes.    Patient Status During Pull:  stable  Post Pull Groin Site:  Level 0  Post Pull Instructions Given:  Yes.    Post Pull Pulses Present:  Yes.    Dressing Applied:  Yes.    Comments:  Pt tolerated well

## 2018-03-12 ENCOUNTER — Encounter (HOSPITAL_COMMUNITY): Payer: Self-pay | Admitting: Cardiology

## 2018-03-12 ENCOUNTER — Telehealth: Payer: Self-pay | Admitting: *Deleted

## 2018-03-12 NOTE — Telephone Encounter (Signed)
I have left message per Richardson Dopp, PA to cancel his appt with the pt who is scheduled for 4/29. Pt is scheduled to see Richardson Dopp, PA and seeing Dr. Aundra Dubin same day. Per PA keep appt with Dr. Aundra Dubin and cancel his appt with the pt. I lmom. If any questions to please call [phone].

## 2018-03-12 NOTE — Telephone Encounter (Signed)
-----   Message from Liliane Shi, [place] sent at 03/12/2018  4:59 PM EDT ----- Regarding: cancel FU 03/26/18 with me; keep FU with DM Cancel follow up with me 03/26/18. He has follow up same day with Dr. Loralie Champagne.  He will follow up with Dr. Loralie Champagne in CHF clinic. Richardson Dopp, PA-C    03/12/2018 4:59 PM

## 2018-03-13 MED FILL — Heparin Sodium (Porcine) Inj 1000 Unit/ML: INTRAMUSCULAR | Qty: 10 | Status: AC

## 2018-03-13 MED FILL — Heparin Sodium (Porcine) 2 Unit/ML in Sodium Chloride 0.9%: INTRAMUSCULAR | Qty: 1000 | Status: AC

## 2018-03-22 ENCOUNTER — Encounter: Payer: Self-pay | Admitting: Internal Medicine

## 2018-03-26 ENCOUNTER — Encounter (HOSPITAL_COMMUNITY): Payer: Self-pay | Admitting: Cardiology

## 2018-03-26 ENCOUNTER — Ambulatory Visit: Payer: PPO | Admitting: Physician Assistant

## 2018-03-26 ENCOUNTER — Other Ambulatory Visit: Payer: Self-pay

## 2018-03-26 ENCOUNTER — Ambulatory Visit (HOSPITAL_COMMUNITY)
Admit: 2018-03-26 | Discharge: 2018-03-26 | Disposition: A | Discharge status: Home / Self Care | Payer: PPO | Attending: Cardiology | Admitting: Cardiology

## 2018-03-26 VITALS — BP 130/90 | HR 65 | Wt 249.0 lb

## 2018-03-26 DIAGNOSIS — Z79899 Other long term (current) drug therapy: Secondary | ICD-10-CM | POA: Diagnosis not present

## 2018-03-26 DIAGNOSIS — I5022 Chronic systolic (congestive) heart failure: Secondary | ICD-10-CM | POA: Diagnosis not present

## 2018-03-26 DIAGNOSIS — I429 Cardiomyopathy, unspecified: Secondary | ICD-10-CM | POA: Diagnosis not present

## 2018-03-26 DIAGNOSIS — Z7982 Long term (current) use of aspirin: Secondary | ICD-10-CM | POA: Diagnosis not present

## 2018-03-26 DIAGNOSIS — E785 Hyperlipidemia, unspecified: Secondary | ICD-10-CM | POA: Diagnosis not present

## 2018-03-26 DIAGNOSIS — I10 Essential (primary) hypertension: Secondary | ICD-10-CM

## 2018-03-26 DIAGNOSIS — Z87891 Personal history of nicotine dependence: Secondary | ICD-10-CM | POA: Insufficient documentation

## 2018-03-26 MED ORDER — SACUBITRIL-VALSARTAN 24-26 MG PO TABS: 1.0000 | ORAL_TABLET | Freq: Two times a day (BID) | ORAL | 6 refills | Status: DC

## 2018-03-26 NOTE — Patient Instructions (Addendum)
Stop Lisinopril   Start Entresto 24/26 mg Twice daily STARTING Wednesday 03/28/18 AM, this has been sent to [facility], they will call to discuss whether you want to pick up medication or want it mailed to you.  Labs in 2 weeks  You have been referred to Cardiac Rehab, they will call you to schedule, this may take a couple of weeks  Your physician recommends that you schedule a follow-up appointment in: 2-3 months

## 2018-03-26 NOTE — Progress Notes (Signed)
Counseled on new Entresto indication, directions, side effects and interactions. Patient verbalized understanding.   Ruta Hinds. Velva Harman, PharmD, BCPS, CPP Clinical Pharmacist Phone: [phone] 03/26/2018 3:58 PM

## 2018-03-27 ENCOUNTER — Telehealth (HOSPITAL_COMMUNITY): Payer: Self-pay | Admitting: *Deleted

## 2018-03-27 MED FILL — ENTRESTO 24 MG-26 MG TABLET: 24-26 | 30 days supply | Qty: 60 | Fill #0 | Status: TO

## 2018-03-27 NOTE — Progress Notes (Signed)
Patient ID: Kevin Murillo, male   DOB: 02-Aug-1946, 72 y.o.   MRN: [id number] PCP: Dr. Virgina Jock Cardiology: Dr. Aundra Dubin  72 y.o.with history of HTN and nonischemic cardiomyopathy (diagnosed in 2011) presents for cardiology followup.  He had an echo in 1/15, showing that EF remains 25-30% with mild LV dilation.  I had him do a cardiopulmonary exercise test in 3/15 that showed normal functional capacity.  Most recent echo in 4/19 showed EF remains 25-30%.  Repeat LHC/RHC in 4/19 showed no significant coronary disease, mildly elevated LV filling pressure, and CI 2.01.   He return for followup of CHF.  I have not seen him in the office in several years.  Generally, he still is doing pretty well but is not very active. He visits his wife in a SNF daily, eats meals with her (has Alzheimer's).  He is short of breath walking up stairs but has no problems walking on flat ground. No orthopnea/PND.  No chest pain.  No palpitations.  No lightheadedness.  BP tends to run mildly high.   ECG (3/19, personally reviewed): NSR, LAFB, diffuse TWIs.   Labs (6/11): K 3.9, TSH normal, creatinine 1.0  Labs (7/11): HDL 60, LDL 122, K 4.4, creatinine 1.0, BNP 86, SPEP normal, transferrin saturation 24%  Labs (9/11): LDL 67, HDL 48, TGs 145, LFTs normal  Labs (10/11): K 4, creatinine 1.2  Labs (5/13): K 3.9, creatinine 1.0, LDL 66, HDL 44 Labs (7/14): K 3.9, creatinine 1.1 Labs (3/15): K 4.1, creatinine 1.1 Labs (8/15): K 4, creatinine 1.1, BNP 21 Labs (4/19): K 4.4, creatinine 1.06  Allergies (verified):  No Known Drug Allergies   Past Medical History:  1. HTN  2. Left hand neuropathy  3. Nonischemic cardiomyopathy: Echo (6/11) with EF 30-35%, posteroinferior severe hypokinesis, moderate diastolic dysfunction, mild left atrial enlargement, mild LV hypertrophy. LHC (7/11): EF 30-35% with diffuse hypokinesis, no angiographic CAD. SPEP and Fe studies unremarkable, TSH normal.  Repeat echo (1/12) with EF 45%, mild  global hypokinesis, mild LVH, normal RV size and systolic function.  Echo (6/14) with EF 20-25%, diffuse hypokinesis, mild LV dilation. Echo (1/15) with mild LV dilation, EF 25-30%, diffuse hypokinesis, prominent apical trabeculation, mildly dilated RV. CPX (3/15) with VO2 max 20.1, VE/VCO2 slope 34.7 (near normal when adjusted to respiratory compensation point) => this CPX showed normal functional capacity for his age.  - Echo (4/19): EF 25-30%, mild MR.  - RHC/LHC (4/19): No significant CAD.  Mean RA 6, PA 46/18 mean 32, mean PCWP 21, CI 2.01, PVR 2.4 WU (pulmonary venous hypertension).  4. Hyperlipidemia   Family History:  Mother died with CHF at 37.   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  Quit smoking in 1996.  Occasional ETOH  Married with 3 children. Wife is now in dementia unit at [facility].    ROS: All systems reviewed and negative except as per HPI.   Current Outpatient Medications  Medication Sig Dispense Refill  . aspirin 81 MG tablet Take 81 mg by mouth daily.    . carvedilol (COREG) 25 MG tablet Take 1 tablet (25 mg total) by mouth 2 (two) times daily. 60 tablet 11  . simvastatin (ZOCOR) 40 MG tablet Take 40 mg by mouth daily at 6 PM.     . spironolactone (ALDACTONE) 25 MG tablet Take 1 tablet (25 mg total) by mouth daily. 30 tablet  11  . sacubitril-valsartan (ENTRESTO) 24-26 MG Take 1 tablet by mouth 2 (two) times daily. 60 tablet 6   No current facility-administered medications for this encounter.     BP 130/90   Pulse 65   Wt 249 lb (112.9 kg)   SpO2 100%   BMI 35.73 kg/m  General: NAD Neck: No JVD, no thyromegaly or thyroid nodule.  Lungs: Clear to auscultation bilaterally with normal respiratory effort. CV: Nondisplaced PMI.  Heart regular S1/S2, no S3/S4, no murmur.  No peripheral edema.  No carotid bruit.  Normal pedal pulses.  Abdomen: Soft,  nontender, no hepatosplenomegaly, no distention.  Skin: Intact without lesions or rashes.  Neurologic: Alert and oriented x 3.  Psych: Normal affect. Extremities: No clubbing or cyanosis.  HEENT: Normal.   Assessment/Plan: 1. Nonischemic cardiomyopathy: Possibly due to viral myocarditis versus HTN.  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 Last echo in 4/19 showed EF 25-30%, stable compared to the past.  No CAD on 4/19 cath, but CI marginal at 2.01 with mildly elevated PCWP. NYHA class II symptoms.  No volume overload on exam.  - He did not get an ICD in the past with minimal symptoms and nonischemic etiology. Will plan medical titration and will reassess need for ICD with future echo.  Would probably aim to avoid given more advanced age and no CAD unless he is proven to have ventricular arrhythmias.  - Continue Coreg 25 mg bid.  - Continue spironolactone 25 daily.  - Stop lisinopril and start Entresto 24/26 bid. BMET in 2 wks.  - I will refer for cardiac rehab.  2. HTN: Transitioning to [facility] should lower BP.   Followup in 2 months.   Loralie Champagne 03/27/2018

## 2018-03-27 NOTE — Telephone Encounter (Signed)
Entresto 24-26 mg PA approved from 03/27/18 through 03/28/19 under patient's [place].

## 2018-03-30 ENCOUNTER — Encounter (HOSPITAL_COMMUNITY): Payer: PPO | Admitting: Cardiology

## 2018-04-09 ENCOUNTER — Ambulatory Visit (HOSPITAL_COMMUNITY)
Admission: RE | Admit: 2018-04-09 | Discharge: 2018-04-09 | Disposition: A | Discharge status: Home / Self Care | Payer: PPO | Source: Ambulatory Visit | Attending: Cardiology | Admitting: Cardiology

## 2018-04-09 DIAGNOSIS — I5022 Chronic systolic (congestive) heart failure: Secondary | ICD-10-CM

## 2018-04-09 LAB — BASIC METABOLIC PANEL
Anion gap: 8 (ref 5–15)
BUN: 16 mg/dL (ref 6–20)
CALCIUM: 9.6 mg/dL (ref 8.9–10.3)
CHLORIDE: 106 mmol/L (ref 101–111)
CO2: 25 mmol/L (ref 22–32)
CREATININE: 1.13 mg/dL (ref 0.61–1.24)
GFR calc non Af Amer: >60 mL/min (ref >60)
Glucose, Bld: 85 mg/dL (ref 65–99)
Potassium: 4.4 mmol/L (ref 3.5–5.1)
Sodium: 139 mmol/L (ref 135–145)

## 2018-04-19 ENCOUNTER — Encounter (HOSPITAL_COMMUNITY): Payer: PPO | Admitting: Cardiology

## 2018-04-24 ENCOUNTER — Other Ambulatory Visit: Payer: Self-pay | Admitting: Cardiology

## 2018-05-15 DIAGNOSIS — E7849 Other hyperlipidemia: Secondary | ICD-10-CM | POA: Diagnosis not present

## 2018-05-15 DIAGNOSIS — I10 Essential (primary) hypertension: Secondary | ICD-10-CM | POA: Diagnosis not present

## 2018-05-15 DIAGNOSIS — R82998 Other abnormal findings in urine: Secondary | ICD-10-CM | POA: Diagnosis not present

## 2018-05-15 DIAGNOSIS — R7309 Other abnormal glucose: Secondary | ICD-10-CM | POA: Diagnosis not present

## 2018-05-15 DIAGNOSIS — Z125 Encounter for screening for malignant neoplasm of prostate: Secondary | ICD-10-CM | POA: Diagnosis not present

## 2018-05-21 ENCOUNTER — Other Ambulatory Visit: Payer: Self-pay | Admitting: Physician Assistant

## 2018-05-21 DIAGNOSIS — I428 Other cardiomyopathies: Secondary | ICD-10-CM

## 2018-05-21 DIAGNOSIS — Z0000 Encounter for general adult medical examination without abnormal findings: Secondary | ICD-10-CM | POA: Diagnosis not present

## 2018-05-21 DIAGNOSIS — Z23 Encounter for immunization: Secondary | ICD-10-CM | POA: Diagnosis not present

## 2018-05-21 DIAGNOSIS — I77819 Aortic ectasia, unspecified site: Secondary | ICD-10-CM | POA: Diagnosis not present

## 2018-05-21 DIAGNOSIS — I429 Cardiomyopathy, unspecified: Secondary | ICD-10-CM | POA: Diagnosis not present

## 2018-05-21 DIAGNOSIS — I5022 Chronic systolic (congestive) heart failure: Secondary | ICD-10-CM | POA: Diagnosis not present

## 2018-05-21 DIAGNOSIS — I110 Hypertensive heart disease with heart failure: Secondary | ICD-10-CM | POA: Diagnosis not present

## 2018-05-21 DIAGNOSIS — Z6838 Body mass index (BMI) 38.0-38.9, adult: Secondary | ICD-10-CM | POA: Diagnosis not present

## 2018-05-21 DIAGNOSIS — I2721 Secondary pulmonary arterial hypertension: Secondary | ICD-10-CM | POA: Diagnosis not present

## 2018-05-21 DIAGNOSIS — Z1389 Encounter for screening for other disorder: Secondary | ICD-10-CM | POA: Diagnosis not present

## 2018-05-21 DIAGNOSIS — L723 Sebaceous cyst: Secondary | ICD-10-CM | POA: Diagnosis not present

## 2018-05-21 DIAGNOSIS — I517 Cardiomegaly: Secondary | ICD-10-CM | POA: Diagnosis not present

## 2018-05-22 DIAGNOSIS — Z1212 Encounter for screening for malignant neoplasm of rectum: Secondary | ICD-10-CM | POA: Diagnosis not present

## 2018-05-22 NOTE — Telephone Encounter (Signed)
This is a CHF pt 

## 2018-05-28 ENCOUNTER — Telehealth (HOSPITAL_COMMUNITY): Payer: Self-pay

## 2018-05-28 NOTE — Telephone Encounter (Signed)
Attempted to call patient in regards to Cardiac Rehab - LM on VM 

## 2018-05-28 NOTE — Telephone Encounter (Signed)
Patient called back to schedule appointment for Cardiac Rehab Program. Patient will come in for orientation on 07/05/18 @ 7:30AM and will attend the 11:15AM exercise class.  Mailed homework package.

## 2018-06-26 ENCOUNTER — Ambulatory Visit (HOSPITAL_COMMUNITY)
Admission: RE | Admit: 2018-06-26 | Discharge: 2018-06-26 | Disposition: A | Discharge status: Home / Self Care | Payer: PPO | Source: Ambulatory Visit | Attending: Cardiology | Admitting: Cardiology

## 2018-06-26 ENCOUNTER — Encounter (HOSPITAL_COMMUNITY): Payer: Self-pay | Admitting: Cardiology

## 2018-06-26 VITALS — BP 132/78 | HR 74 | Wt 246.8 lb

## 2018-06-26 DIAGNOSIS — I420 Dilated cardiomyopathy: Secondary | ICD-10-CM | POA: Diagnosis not present

## 2018-06-26 DIAGNOSIS — I428 Other cardiomyopathies: Secondary | ICD-10-CM | POA: Insufficient documentation

## 2018-06-26 DIAGNOSIS — Z7982 Long term (current) use of aspirin: Secondary | ICD-10-CM | POA: Insufficient documentation

## 2018-06-26 DIAGNOSIS — Z79899 Other long term (current) drug therapy: Secondary | ICD-10-CM | POA: Diagnosis not present

## 2018-06-26 DIAGNOSIS — I5022 Chronic systolic (congestive) heart failure: Secondary | ICD-10-CM

## 2018-06-26 DIAGNOSIS — I10 Essential (primary) hypertension: Secondary | ICD-10-CM | POA: Diagnosis not present

## 2018-06-26 DIAGNOSIS — E785 Hyperlipidemia, unspecified: Secondary | ICD-10-CM | POA: Insufficient documentation

## 2018-06-26 DIAGNOSIS — Z87891 Personal history of nicotine dependence: Secondary | ICD-10-CM | POA: Diagnosis not present

## 2018-06-26 DIAGNOSIS — Z8249 Family history of ischemic heart disease and other diseases of the circulatory system: Secondary | ICD-10-CM | POA: Insufficient documentation

## 2018-06-26 LAB — BASIC METABOLIC PANEL
Anion gap: 6 (ref 5–15)
BUN: 20 mg/dL (ref 8–23)
CO2: 26 mmol/L (ref 22–32)
CREATININE: 1.22 mg/dL (ref 0.61–1.24)
Calcium: 9.2 mg/dL (ref 8.9–10.3)
Chloride: 107 mmol/L (ref 98–111)
GFR calc Af Amer: >60 mL/min (ref >60)
GFR calc non Af Amer: 57 mL/min — ABNORMAL LOW (ref >60)
GLUCOSE: 112 mg/dL — AB (ref 70–99)
Potassium: 3.9 mmol/L (ref 3.5–5.1)
SODIUM: 139 mmol/L (ref 135–145)

## 2018-06-26 MED ORDER — SACUBITRIL-VALSARTAN 49-51 MG PO TABS: 1.0000 | ORAL_TABLET | Freq: Two times a day (BID) | ORAL | 11 refills | Status: DC

## 2018-06-26 NOTE — Patient Instructions (Signed)
INCREASE Entresto to 49-51 mg twice daily. Can double up on current 24-26 mg tablets you have at home (Take 2 tabs twice daily). New Rx has been sent to your pharmacy for 49-51 mg tablets (Take 1 tab twice daily).  Routine lab work today. Will notify you of abnormal results, otherwise no news is good news!  Return in 1-2 weeks for repeat labs.  _________________________________________________________________ Kevin Murillo Code: 1500  Follow up 3 months with Dr. Aundra Dubin.  __________________________________________________________________ Kevin Murillo Code: 3474  Take all medication as prescribed the day of your appointment. Bring all medications with you to your appointment.  Do the following things EVERYDAY: 1) Weigh yourself in the morning before breakfast. Write it down and keep it in a log. 2) Take your medicines as prescribed 3) Eat low salt foods-Limit salt (sodium) to 2000 mg per day.  4) Stay as active as you can everyday 5) Limit all fluids for the day to less than 2 liters

## 2018-06-26 NOTE — Progress Notes (Signed)
Patient ID: Kevin Murillo, male   DOB: 06/25/1946, 72 y.o.   MRN: [id number] PCP: Dr. Virgina Jock Cardiology: Dr. Aundra Dubin  72 y.o.with history of HTN and nonischemic cardiomyopathy (diagnosed in 2011) presents for cardiology followup.  He had an echo in 1/15, showing that EF remains 25-30% with mild LV dilation.  I had him do a cardiopulmonary exercise test in 3/15 that showed normal functional capacity.  Most recent echo in 4/19 showed EF remains 25-30%.  Repeat LHC/RHC in 4/19 showed no significant coronary disease, mildly elevated LV filling pressure, and CI 2.01.   He return for followup of CHF.  He is doing well.  Planning to start cardiac rehab soon.  Weight down 3 lbs.  No significant exertional dyspnea.  No orthopnea/PND.  No chest pain.  No lightheadedness/syncope.  No palpitations.   Labs (6/11): K 3.9, TSH normal, creatinine 1.0  Labs (7/11): HDL 60, LDL 122, K 4.4, creatinine 1.0, BNP 86, SPEP normal, transferrin saturation 24%  Labs (9/11): LDL 67, HDL 48, TGs 145, LFTs normal  Labs (10/11): K 4, creatinine 1.2  Labs (5/13): K 3.9, creatinine 1.0, LDL 66, HDL 44 Labs (7/14): K 3.9, creatinine 1.1 Labs (3/15): K 4.1, creatinine 1.1 Labs (8/15): K 4, creatinine 1.1, BNP 21 Labs (4/19): K 4.4, creatinine 1.06 Labs (5/19): K 4.4, creatinine 1.13  Allergies (verified):  No Known Drug Allergies   Past Medical History:  1. HTN  2. Left hand neuropathy  3. Nonischemic cardiomyopathy: Echo (6/11) with EF 30-35%, posteroinferior severe hypokinesis, moderate diastolic dysfunction, mild left atrial enlargement, mild LV hypertrophy. LHC (7/11): EF 30-35% with diffuse hypokinesis, no angiographic CAD. SPEP and Fe studies unremarkable, TSH normal.  Repeat echo (1/12) with EF 45%, mild global hypokinesis, mild LVH, normal RV size and systolic function.  Echo (6/14) with EF 20-25%, diffuse hypokinesis, mild LV dilation. Echo (1/15) with mild LV dilation, EF 25-30%, diffuse hypokinesis, prominent  apical trabeculation, mildly dilated RV. CPX (3/15) with VO2 max 20.1, VE/VCO2 slope 34.7 (near normal when adjusted to respiratory compensation point) => this CPX showed normal functional capacity for his age.  - Echo (4/19): EF 25-30%, mild MR.  - RHC/LHC (4/19): No significant CAD.  Mean RA 6, PA 46/18 mean 32, mean PCWP 21, CI 2.01, PVR 2.4 WU (pulmonary venous hypertension).  4. Hyperlipidemia   Family History:  Mother died with CHF at 5.   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  Quit smoking in 1996.  Occasional ETOH  Married with 3 children. Wife is now in dementia unit at [facility].    ROS: All systems reviewed and negative except as per HPI.   Current Outpatient Medications  Medication Sig Dispense Refill  . aspirin 81 MG tablet Take 81 mg by mouth daily.    . carvedilol (COREG) 25 MG tablet Take 1 tablet (25 mg total) by mouth 2 (two) times daily. 60 tablet 11  . simvastatin (ZOCOR) 40 MG tablet Take 40 mg by mouth daily at 6 PM.     . spironolactone (ALDACTONE) 25 MG tablet Take 1 tablet (25 mg total) by mouth daily. 30 tablet 11  . sacubitril-valsartan (ENTRESTO) 49-51 MG Take 1 tablet by mouth 2 (two) times daily. 60 tablet 11   No current facility-administered medications for this encounter.     BP 132/78   Pulse 74   Wt 246 lb 12.8  oz (111.9 kg)   SpO2 97%   BMI 35.41 kg/m  General: NAD Neck: No JVD, no thyromegaly or thyroid nodule.  Lungs: Clear to auscultation bilaterally with normal respiratory effort. CV: Nondisplaced PMI.  Heart regular S1/S2, no S3/S4, no murmur.  No peripheral edema.  No carotid bruit.  Normal pedal pulses.  Abdomen: Soft, nontender, no hepatosplenomegaly, no distention.  Skin: Intact without lesions or rashes.  Neurologic: Alert and oriented x 3.  Psych: Normal affect. Extremities: No clubbing or cyanosis.  HEENT:  Normal.   Assessment/Plan: 1. Nonischemic cardiomyopathy: Possibly due to viral myocarditis versus HTN.  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 Last echo in 4/19 showed EF 25-30%, stable compared to the past.  No CAD on 4/19 cath, but CI marginal at 2.01 with mildly elevated PCWP. NYHA class II symptoms.  Not volume overloaded on exam.   - He did not get an ICD in the past with minimal symptoms and nonischemic etiology. Will plan medical titration and will reassess need for ICD with future echo.  Would probably aim to avoid given more advanced age and no CAD unless he is proven to have ventricular arrhythmias.  - Continue Coreg 25 mg bid.  - Continue spironolactone 25 daily.  - Increase Entresto to 49/51 bid with BMET today and in 10 days.  - He will start cardiac rehab in August.  2. HTN: BP controlled.   Followup in 3 months.   Loralie Champagne 06/26/2018

## 2018-06-28 ENCOUNTER — Telehealth (HOSPITAL_COMMUNITY): Payer: Self-pay

## 2018-07-03 ENCOUNTER — Telehealth (HOSPITAL_COMMUNITY): Payer: Self-pay

## 2018-07-05 ENCOUNTER — Encounter (HOSPITAL_COMMUNITY)
Admission: RE | Admit: 2018-07-05 | Discharge: 2018-07-05 | Disposition: A | Discharge status: Home / Self Care | Payer: PPO | Source: Ambulatory Visit | Attending: Cardiology | Admitting: Cardiology

## 2018-07-05 ENCOUNTER — Encounter: Payer: Self-pay | Admitting: *Deleted

## 2018-07-05 ENCOUNTER — Encounter (HOSPITAL_COMMUNITY): Payer: Self-pay

## 2018-07-05 ENCOUNTER — Ambulatory Visit (HOSPITAL_COMMUNITY)
Admission: RE | Admit: 2018-07-05 | Discharge: 2018-07-05 | Disposition: A | Discharge status: Home / Self Care | Payer: PPO | Source: Ambulatory Visit | Attending: Internal Medicine | Admitting: Internal Medicine

## 2018-07-05 VITALS — Ht 68.5 in | Wt 239.0 lb

## 2018-07-05 DIAGNOSIS — G629 Polyneuropathy, unspecified: Secondary | ICD-10-CM | POA: Diagnosis not present

## 2018-07-05 DIAGNOSIS — E785 Hyperlipidemia, unspecified: Secondary | ICD-10-CM | POA: Insufficient documentation

## 2018-07-05 DIAGNOSIS — I110 Hypertensive heart disease with heart failure: Secondary | ICD-10-CM | POA: Insufficient documentation

## 2018-07-05 DIAGNOSIS — Z79899 Other long term (current) drug therapy: Secondary | ICD-10-CM | POA: Diagnosis not present

## 2018-07-05 DIAGNOSIS — Z87891 Personal history of nicotine dependence: Secondary | ICD-10-CM | POA: Diagnosis not present

## 2018-07-05 DIAGNOSIS — Z7982 Long term (current) use of aspirin: Secondary | ICD-10-CM | POA: Insufficient documentation

## 2018-07-05 DIAGNOSIS — I420 Dilated cardiomyopathy: Secondary | ICD-10-CM | POA: Diagnosis not present

## 2018-07-05 DIAGNOSIS — I5020 Unspecified systolic (congestive) heart failure: Secondary | ICD-10-CM | POA: Diagnosis present

## 2018-07-05 DIAGNOSIS — I5022 Chronic systolic (congestive) heart failure: Secondary | ICD-10-CM

## 2018-07-05 DIAGNOSIS — I428 Other cardiomyopathies: Secondary | ICD-10-CM | POA: Insufficient documentation

## 2018-07-05 DIAGNOSIS — Z006 Encounter for examination for normal comparison and control in clinical research program: Secondary | ICD-10-CM

## 2018-07-05 LAB — BASIC METABOLIC PANEL
Anion gap: 9 (ref 5–15)
BUN: 20 mg/dL (ref 8–23)
CHLORIDE: 104 mmol/L (ref 98–111)
CO2: 27 mmol/L (ref 22–32)
Calcium: 9.5 mg/dL (ref 8.9–10.3)
Creatinine, Ser: 1.29 mg/dL — ABNORMAL HIGH (ref 0.61–1.24)
GFR, EST NON AFRICAN AMERICAN: 53 mL/min — AB (ref >60)
Glucose, Bld: 102 mg/dL — ABNORMAL HIGH (ref 70–99)
Potassium: 4.2 mmol/L (ref 3.5–5.1)
SODIUM: 140 mmol/L (ref 135–145)

## 2018-07-05 NOTE — Progress Notes (Signed)
RADPH Informed Consent           Subject Name:  Kevin Murillo. Kevin Murillo   Subject met inclusion and exclusion criteria.  The informed consent form, study requirements and expectations were reviewed with the subject and questions and concerns were addressed prior to the signing of the consent form.  The subject verbalized understanding of the trial requirements.  The subject agreed to participate in the [facility] trial and signed the informed consent.  The informed consent was obtained prior to performance of any protocol-specific procedures for the subject.  A copy of the signed informed consent was given to the subject and a copy was placed in the subject's medical record.   [place] Jamillia Closson, Research Assistant 07/05/2018 11:29

## 2018-07-05 NOTE — Progress Notes (Signed)
Cardiac Rehab Medication Review by a RN  Does the patient feel that his/her medications are working for him/her?  yes  Has the patient been experiencing any side effects to the medications prescribed?  no  Does the patient measure his/her own blood pressure or blood glucose at home?  No. Kevin Murillo does not weigh himself at home as well.   Does the patient have any problems obtaining medications due to transportation or finances?   no  Understanding of regimen: good Understanding of indications: good Potential of compliance: good    RN comments: Discussed with Asa the importance of weighing himself at home, the role of fluid retention, and its manifestation as weight gain.  Pt demonstrated understanding.    [name] 07/05/2018 10:34 AM

## 2018-07-05 NOTE — Progress Notes (Signed)
Cardiac Individual Treatment Plan  Patient Details  Name: Kevin Murillo MRN: [id number] Date of Birth: 1946-06-30 Referring Provider:     CARDIAC REHAB PHASE II ORIENTATION from 07/05/2018 in [facility]  Referring Provider  Larey Dresser MD       Initial Encounter Date:    CARDIAC REHAB PHASE II ORIENTATION from 07/05/2018 in [facility]  Date  07/05/18      Visit Diagnosis: Heart failure, chronic systolic ([place])  Patient's Home Medications on Admission:  Current Outpatient Medications:  .  aspirin 81 MG tablet, Take 81 mg by mouth daily., Disp: , Rfl:  .  carvedilol (COREG) 25 MG tablet, Take 1 tablet (25 mg total) by mouth 2 (two) times daily., Disp: 60 tablet, Rfl: 11 .  sacubitril-valsartan (ENTRESTO) 49-51 MG, Take 1 tablet by mouth 2 (two) times daily., Disp: 60 tablet, Rfl: 11 .  simvastatin (ZOCOR) 40 MG tablet, Take 40 mg by mouth daily at 6 PM. , Disp: , Rfl:  .  spironolactone (ALDACTONE) 25 MG tablet, Take 1 tablet (25 mg total) by mouth daily., Disp: 30 tablet, Rfl: 11  Past Medical History: Past Medical History:  Diagnosis Date  . Chronic systolic CHF (congestive heart failure), NYHA class 1 ([place])   . History of echocardiogram    Echo 3/16: Mild LVH, EF 40-45%, anteroseptal akinesis, grade 1 diastolic dysfunction, moderate LAE // Echo 3/19: diff HK worse in basal and mid inf wall, mild LVH, EF 25-30, mild MR, mild LAE  . Hyperlipidemia   . Hypertension   . Neuropathy of hand   . Non-ischemic cardiomyopathy ([place])    a. Echo 6/11: 30-35% // b. LHC 7/11: EF 30-35% with diffuse hypokinesis, no angiographic CAD. SPEP and Fe studies unremarkable, TSH normal.//  c. Echo 1/12: EF 45% // d. Echo 6/14: EF 20-25%  //  e. Echo EF 25-30%, diffuse hypokinesis, prominent apical trabeculation // f. CPX 3/15: normal fxnal capacity  // g. CPX 3/16 low norma fxnal capacity  // h. Echo 3/16: mild LVH, EF 40-45%   .  Obesity   . PVC (premature ventricular contraction)   . Rosacea   . Tubular adenoma of colon 12/2010    Tobacco Use: Social History   Tobacco Use  Smoking Status Former Smoker  . Types: Cigarettes  . Last attempt to quit: 03/12/2002  . Years since quitting: 16.3  Smokeless Tobacco Never Used    Labs: Recent Chemical engineer    Labs for ITP Cardiac and Pulmonary Rehab Latest Ref Rng & Units 05/28/2010 07/28/2010 08/03/2010 03/09/2018 03/09/2018   Cholestrol 0 - 200 mg/dL 219(H) 151 144 - -   LDLCALC 0 - 99 mg/dL - 62 67 - -   LDLDIRECT mg/dL 121.7 - - - -   HDL >39.00 mg/dL 59.50 52.20 48.00 - -   Trlycerides 0.0 - 149.0 mg/dL 187.0(H) 186.0(H) 145.0 - -   HCO3 20.0 - 28.0 mmol/L - - - 26.2 25.4   TCO2 22 - 32 mmol/L - - - 28 27   ACIDBASEDEF 0.0 - 2.0 mmol/L - - - - 1.0   O2SAT % - - - 60.0 62.0      Capillary Blood Glucose: No results found for: GLUCAP   Exercise Target Goals: Date: 07/05/18  Exercise Program Goal: Individual exercise prescription set using results from initial 6 min walk test and THRR while considering  patient's activity barriers and safety.   Exercise Prescription  Goal: Initial exercise prescription builds to 30-45 minutes a day of aerobic activity, 2-3 days per week.  Home exercise guidelines will be given to patient during program as part of exercise prescription that the participant will acknowledge.  Activity Barriers & Risk Stratification: Activity Barriers & Cardiac Risk Stratification - 07/05/18 1132      Activity Barriers & Cardiac Risk Stratification   Activity Barriers  Arthritis;Other (comment)    Comments  Occasional Knee and Hip Pain    Cardiac Risk Stratification  High       6 Minute Walk: 6 Minute Walk    Row Name 07/05/18 1129         6 Minute Walk   Phase  Initial     Distance  900 feet     Walk Time  6 minutes     # of Rest Breaks  0     MPH  1.7     METS  1.5     RPE  11     Perceived Dyspnea   0     VO2 Peak   5.25     Symptoms  No     Resting HR  75 bpm     Resting BP  104/70     Resting Oxygen Saturation   98 %     Exercise Oxygen Saturation  during 6 min walk  96 %     Max Ex. HR  104 bpm     Max Ex. BP  118/76     2 Minute Post BP  118/72        Oxygen Initial Assessment:   Oxygen Re-Evaluation:   Oxygen Discharge (Final Oxygen Re-Evaluation):   Initial Exercise Prescription: Initial Exercise Prescription - 07/05/18 1100      Date of Initial Exercise RX and Referring Provider   Date  07/05/18    Referring Provider  Larey Dresser MD     Expected Discharge Date  09/28/18      Recumbant Bike   Level  1.5    Watts  10    Minutes  10    METs  2.3      NuStep   Level  2    SPM  75    Minutes  10    METs  1.5      Track   Laps  7    Minutes  10    METs  2.23      Prescription Details   Frequency (times per week)  3x    Duration  Progress to 30 minutes of continuous aerobic without signs/symptoms of physical distress      Intensity   THRR 40-80% of Max Heartrate  59-118    Ratings of Perceived Exertion  11-13    Perceived Dyspnea  0-4      Progression   Progression  Continue progressive overload as per policy without signs/symptoms or physical distress.      Resistance Training   Training Prescription  Yes    Weight  4lbs    Reps  10-15       Perform Capillary Blood Glucose checks as needed.  Exercise Prescription Changes:   Exercise Comments:   Exercise Goals and Review: Exercise Goals    Row Name 07/05/18 1133             Exercise Goals   Increase Physical Activity  Yes       Intervention  Develop an individualized exercise prescription for  aerobic and resistive training based on initial evaluation findings, risk stratification, comorbidities and participant's personal goals.;Provide advice, education, support and counseling about physical activity/exercise needs.       Expected Outcomes  Short Term: Attend rehab on a regular basis to  increase amount of physical activity.;Long Term: Add in home exercise to make exercise part of routine and to increase amount of physical activity.;Long Term: Exercising regularly at least 3-5 days a week.       Increase Strength and Stamina  Yes       Intervention  Provide advice, education, support and counseling about physical activity/exercise needs.;Develop an individualized exercise prescription for aerobic and resistive training based on initial evaluation findings, risk stratification, comorbidities and participant's personal goals.       Expected Outcomes  Short Term: Increase workloads from initial exercise prescription for resistance, speed, and METs.;Short Term: Perform resistance training exercises routinely during rehab and add in resistance training at home;Long Term: Improve cardiorespiratory fitness, muscular endurance and strength as measured by increased METs and functional capacity (6MWT)       Able to understand and use rate of perceived exertion (RPE) scale  Yes       Intervention  Provide education and explanation on how to use RPE scale       Expected Outcomes  Short Term: Able to use RPE daily in rehab to express subjective intensity level;Long Term:  Able to use RPE to guide intensity level when exercising independently       Knowledge and understanding of Target Heart Rate Range (THRR)  Yes       Intervention  Provide education and explanation of THRR including how the numbers were predicted and where they are located for reference       Expected Outcomes  Short Term: Able to state/look up THRR;Short Term: Able to use daily as guideline for intensity in rehab;Long Term: Able to use THRR to govern intensity when exercising independently       Able to check pulse independently  Yes       Intervention  Provide education and demonstration on how to check pulse in carotid and radial arteries.;Review the importance of being able to check your own pulse for safety during independent  exercise       Expected Outcomes  Short Term: Able to explain why pulse checking is important during independent exercise;Long Term: Able to check pulse independently and accurately       Understanding of Exercise Prescription  Yes       Intervention  Provide education, explanation, and written materials on patient's individual exercise prescription       Expected Outcomes  Short Term: Able to explain program exercise prescription;Long Term: Able to explain home exercise prescription to exercise independently          Exercise Goals Re-Evaluation :    Discharge Exercise Prescription (Final Exercise Prescription Changes):   Nutrition:  Target Goals: Understanding of nutrition guidelines, daily intake of sodium 1500mg , cholesterol 200mg , calories 30% from fat and 7% or less from saturated fats, daily to have 5 or more servings of fruits and vegetables.  Biometrics: Pre Biometrics - 07/05/18 1133      Pre Biometrics   Height  5' 8.5" (1.74 m)    Weight  108.4 kg    Waist Circumference  49 inches    Hip Circumference  47 inches    Waist to Hip Ratio  1.04 %    BMI (Calculated)  35.8  Triceps Skinfold  25 mm    % Body Fat  37.1 %    Grip Strength  38 kg    Flexibility  9.5 in    Single Leg Stand  4.87 seconds        Nutrition Therapy Plan and Nutrition Goals: Nutrition Therapy & Goals - 07/05/18 0901      Nutrition Therapy   Diet  heart healthy      Personal Nutrition Goals   Nutrition Goal  Pt to identify and limit food sources of saturated fat, trans fat, and sodium    Personal Goal #2  Pt to identify food quantities necessary to achieve weight loss of 6-24 lbs. at graduation from cardiac rehab    Personal Goal #3  Pt to decrease number of meals eaten away from home      [place], educate and counsel regarding individualized specific dietary modifications aiming towards targeted core components such as weight, hypertension, lipid  management, diabetes, heart failure and other comorbidities.    Expected Outcomes  Short Term Goal: Understand basic principles of dietary content, such as calories, fat, sodium, cholesterol and nutrients.       Nutrition Assessments: Nutrition Assessments - 07/05/18 0907      MEDFICTS Scores   Pre Score  --       Nutrition Goals Re-Evaluation: Nutrition Goals Re-Evaluation    Row Name 07/05/18 0901             Goals   Current Weight  238 lb 15.7 oz (108.4 kg)          Nutrition Goals Re-Evaluation: Nutrition Goals Re-Evaluation    [place] Name 07/05/18 0901             Goals   Current Weight  238 lb 15.7 oz (108.4 kg)          Nutrition Goals Discharge (Final Nutrition Goals Re-Evaluation): Nutrition Goals Re-Evaluation - 07/05/18 0901      Goals   Current Weight  238 lb 15.7 oz (108.4 kg)       Psychosocial: Target Goals: Acknowledge presence or absence of significant depression and/or stress, maximize coping skills, provide positive support system. Participant is able to verbalize types and ability to use techniques and skills needed for reducing stress and depression.  Initial Review & Psychosocial Screening: Initial Psych Review & Screening - 07/05/18 1033      Initial Review   Current issues with  None Identified      Family Dynamics   Good Support System?  Yes      Barriers   Psychosocial barriers to participate in program  There are no identifiable barriers or psychosocial needs.      Screening Interventions   Interventions  Encouraged to exercise       Quality of Life Scores: Quality of Life - 07/05/18 1033      Quality of Life   Select  Quality of Life      Quality of Life Scores   Health/Function Pre  24.33 %    Socioeconomic Pre  28.5 %    Psych/Spiritual Pre  26.57 %    Family Pre  25.2 %    GLOBAL Pre  25.86 %      Scores of 19 and below usually indicate a poorer quality of life in these areas.  A difference of  2-3 points is  a clinically meaningful difference.  A difference of 2-3 points in the total  score of the Quality of Life Index has been associated with significant improvement in overall quality of life, self-image, physical symptoms, and general health in studies assessing change in quality of life.  PHQ-9: Recent Review Flowsheet Data    There is no flowsheet data to display.     Interpretation of Total Score  Total Score Depression Severity:  1-4 = Minimal depression, 5-9 = Mild depression, 10-14 = Moderate depression, 15-19 = Moderately severe depression, 20-27 = Severe depression   Psychosocial Evaluation and Intervention:   Psychosocial Re-Evaluation:   Psychosocial Discharge (Final Psychosocial Re-Evaluation):   Vocational Rehabilitation: Provide vocational rehab assistance to qualifying candidates.   Vocational Rehab Evaluation & Intervention: Vocational Rehab - 07/05/18 1034      Initial Vocational Rehab Evaluation & Intervention   Assessment shows need for Vocational Rehabilitation  No       Education: Education Goals: Education classes will be provided on a weekly basis, covering required topics. Participant will state understanding/return demonstration of topics presented.  Learning Barriers/Preferences: Learning Barriers/Preferences - 07/05/18 1145      Learning Barriers/Preferences   Learning Barriers  None    Learning Preferences  Written Material;Skilled Demonstration       Education Topics: Count Your Pulse:  -Group instruction provided by verbal instruction, demonstration, patient participation and written materials to support subject.  Instructors address importance of being able to find your pulse and how to count your pulse when at home without a heart monitor.  Patients get hands on experience counting their pulse with staff help and individually.   Heart Attack, Angina, and Risk Factor Modification:  -Group instruction provided by verbal instruction, video,  and written materials to support subject.  Instructors address signs and symptoms of angina and heart attacks.    Also discuss risk factors for heart disease and how to make changes to improve heart health risk factors.   Functional Fitness:  -Group instruction provided by verbal instruction, demonstration, patient participation, and written materials to support subject.  Instructors address safety measures for doing things around the house.  Discuss how to get up and down off the floor, how to pick things up properly, how to safely get out of a chair without assistance, and balance training.   Meditation and Mindfulness:  -Group instruction provided by verbal instruction, patient participation, and written materials to support subject.  Instructor addresses importance of mindfulness and meditation practice to help reduce stress and improve awareness.  Instructor also leads participants through a meditation exercise.    Stretching for Flexibility and Mobility:  -Group instruction provided by verbal instruction, patient participation, and written materials to support subject.  Instructors lead participants through series of stretches that are designed to increase flexibility thus improving mobility.  These stretches are additional exercise for major muscle groups that are typically performed during regular warm up and cool down.   Hands Only CPR:  -Group verbal, video, and participation provides a basic overview of AHA guidelines for community CPR. Role-play of emergencies allow participants the opportunity to practice calling for help and chest compression technique with discussion of AED use.   Hypertension: -Group verbal and written instruction that provides a basic overview of hypertension including the most recent diagnostic guidelines, risk factor reduction with self-care instructions and medication management.    Nutrition I class: Heart Healthy Eating:  -Group instruction provided by  PowerPoint slides, verbal discussion, and written materials to support subject matter. The instructor gives an explanation and review of the Therapeutic Lifestyle  Changes diet recommendations, which includes a discussion on lipid goals, dietary fat, sodium, fiber, plant stanol/sterol esters, sugar, and the components of a well-balanced, healthy diet.   Nutrition II class: Lifestyle Skills:  -Group instruction provided by PowerPoint slides, verbal discussion, and written materials to support subject matter. The instructor gives an explanation and review of label reading, grocery shopping for heart health, heart healthy recipe modifications, and ways to make healthier choices when eating out.   Diabetes Question & Answer:  -Group instruction provided by PowerPoint slides, verbal discussion, and written materials to support subject matter. The instructor gives an explanation and review of diabetes co-morbidities, pre- and post-prandial blood glucose goals, pre-exercise blood glucose goals, signs, symptoms, and treatment of hypoglycemia and hyperglycemia, and foot care basics.   Diabetes Blitz:  -Group instruction provided by PowerPoint slides, verbal discussion, and written materials to support subject matter. The instructor gives an explanation and review of the physiology behind type 1 and type 2 diabetes, diabetes medications and rational behind using different medications, pre- and post-prandial blood glucose recommendations and Hemoglobin A1c goals, diabetes diet, and exercise including blood glucose guidelines for exercising safely.    Portion Distortion:  -Group instruction provided by PowerPoint slides, verbal discussion, written materials, and food models to support subject matter. The instructor gives an explanation of serving size versus portion size, changes in portions sizes over the last 20 years, and what consists of a serving from each food group.   Stress Management:  -Group  instruction provided by verbal instruction, video, and written materials to support subject matter.  Instructors review role of stress in heart disease and how to cope with stress positively.     Exercising on Your Own:  -Group instruction provided by verbal instruction, power point, and written materials to support subject.  Instructors discuss benefits of exercise, components of exercise, frequency and intensity of exercise, and end points for exercise.  Also discuss use of nitroglycerin and activating EMS.  Review options of places to exercise outside of rehab.  Review guidelines for sex with heart disease.   Cardiac Drugs I:  -Group instruction provided by verbal instruction and written materials to support subject.  Instructor reviews cardiac drug classes: antiplatelets, anticoagulants, beta blockers, and statins.  Instructor discusses reasons, side effects, and lifestyle considerations for each drug class.   Cardiac Drugs II:  -Group instruction provided by verbal instruction and written materials to support subject.  Instructor reviews cardiac drug classes: angiotensin converting enzyme inhibitors (ACE-I), angiotensin II receptor blockers (ARBs), nitrates, and calcium channel blockers.  Instructor discusses reasons, side effects, and lifestyle considerations for each drug class.   Anatomy and Physiology of the Circulatory System:  Group verbal and written instruction and models provide basic cardiac anatomy and physiology, with the coronary electrical and arterial systems. Review of: AMI, Angina, Valve disease, Heart Failure, Peripheral Artery Disease, Cardiac Arrhythmia, Pacemakers, and the ICD.   Other Education:  -Group or individual verbal, written, or video instructions that support the educational goals of the cardiac rehab program.   Holiday Eating Survival Tips:  -Group instruction provided by PowerPoint slides, verbal discussion, and written materials to support subject  matter. The instructor gives patients tips, tricks, and techniques to help them not only survive but enjoy the holidays despite the onslaught of food that accompanies the holidays.   Knowledge Questionnaire Score: Knowledge Questionnaire Score - 07/05/18 1034      Knowledge Questionnaire Score   Pre Score  23/24  Core Components/Risk Factors/Patient Goals at Admission:   Core Components/Risk Factors/Patient Goals Review:    Core Components/Risk Factors/Patient Goals at Discharge (Final Review):    ITP Comments: ITP Comments    Row Name 07/05/18 1128           ITP Comments  Dr. Fransico Him, Medical Director           Comments: Patient attended orientation from [phone] to 0912 to review rules and guidelines for program. Completed 6 minute walk test, Intitial ITP, and exercise prescription.  VSS. Telemetry-SR with inverted T wave and occasional multifocal PVCs.  Asymptomatic.

## 2018-07-05 NOTE — Progress Notes (Signed)
Kevin Murillo 72 y.o. male DOB: Oct 08, 1946 MRN: [id number]      Nutrition Note  No diagnosis found. Past Medical History:  Diagnosis Date  . Chronic systolic CHF (congestive heart failure), NYHA class 1 ([place])   . History of echocardiogram    Echo 3/16: Mild LVH, EF 40-45%, anteroseptal akinesis, grade 1 diastolic dysfunction, moderate LAE // Echo 3/19: diff HK worse in basal and mid inf wall, mild LVH, EF 25-30, mild MR, mild LAE  . Hyperlipidemia   . Hypertension   . Neuropathy of hand   . Non-ischemic cardiomyopathy ([place])    a. Echo 6/11: 30-35% // b. LHC 7/11: EF 30-35% with diffuse hypokinesis, no angiographic CAD. SPEP and Fe studies unremarkable, TSH normal.//  c. Echo 1/12: EF 45% // d. Echo 6/14: EF 20-25%  //  e. Echo EF 25-30%, diffuse hypokinesis, prominent apical trabeculation // f. CPX 3/15: normal fxnal capacity  // g. CPX 3/16 low norma fxnal capacity  // h. Echo 3/16: mild LVH, EF 40-45%   . Obesity   . PVC (premature ventricular contraction)   . Rosacea   . Tubular adenoma of colon 12/2010   Meds reviewed. Simvastatin, coreg noted  HT: Ht Readings from Last 1 Encounters:  07/05/18 5' 8.5" (1.74 m)    WT: Wt Readings from Last 5 Encounters:  07/05/18 238 lb 15.7 oz (108.4 kg)  06/26/18 246 lb 12.8 oz (111.9 kg)  03/26/18 249 lb (112.9 kg)  03/09/18 240 lb (108.9 kg)  02/20/18 249 lb (112.9 kg)     Body mass index is 35.81 kg/m.   Current tobacco use? no   Labs:  Lipid Panel     Component Value Date/Time   CHOL 144 08/03/2010 0828   TRIG 145.0 08/03/2010 0828   HDL 48.00 08/03/2010 0828   CHOLHDL 3 08/03/2010 0828   VLDL 29.0 08/03/2010 0828   LDLCALC 67 08/03/2010 0828   LDLDIRECT 121.7 05/28/2010 0846    No results found for: HGBA1C CBG (last 3)  No results for input(s): GLUCAP in the last 72 hours.  Nutrition Note Spoke with pt. Nutrition plan and goals reviewed with pt. Pt did not complete MEDFICTS survey, requested he fill it out and  bring back his first day of orientation. Pt wants to lose wt. Pt has not been actively trying to lose wt. Wt loss tips reviewed (eat more meals at home, portion size, how to build a healthy plate). Per discussion, pt does not use canned/convenience foods often. Pt rarely adds salt to food. Pt eats out frequently., most meals. Pts wife did most of the cooking, since she has lived in skilled nursing facility (past 6 years) he has started to eat out more frequently. Pt expressed understanding of the information reviewed. Pt aware of nutrition education classes offered and plans on attending nutrition classes.  Nutrition Diagnosis ? Food-and nutrition-related knowledge deficit related to lack of exposure to information as related to diagnosis of: ? CVD  ? Obesity related to excessive energy intake as evidenced by a Body mass index is 35.81 kg/m.  Nutrition Intervention ? Pt's individual nutrition plan and goals reviewed with pt. ? Pt given handouts for: ? Nutrition I class ? Nutrition II class  ? Diabetes Blitz Class ? Diabetes Q & A class  ? Consistent vit K diet ? low sodium ? DM ? pre-diabetes  Nutrition Goal(s):   ? Pt to identify and limit food sources of saturated fat, trans fat, and sodium ? Pt  to identify food quantities necessary to achieve weight loss of 6-24 lbs. at graduation from cardiac rehab. Goal wt loss of 30-40 lb in total desired.  ? Pt to decrease number of meals eaten away from home   Plan:  ? Pt to attend nutrition classes ? Nutrition I ? Nutrition II ? Portion Distortion  ? Will provide client-centered nutrition education as part of interdisciplinary care ? Monitor and evaluate progress toward nutrition goal with team.   Laurina Bustle, MS, RD, LDN 07/05/2018 9:02 AM

## 2018-07-13 ENCOUNTER — Encounter (HOSPITAL_COMMUNITY): Payer: PPO

## 2018-07-13 ENCOUNTER — Encounter (HOSPITAL_COMMUNITY)
Admission: RE | Admit: 2018-07-13 | Discharge: 2018-07-13 | Disposition: A | Discharge status: Home / Self Care | Payer: PPO | Source: Ambulatory Visit | Attending: Cardiology | Admitting: Cardiology

## 2018-07-13 DIAGNOSIS — I110 Hypertensive heart disease with heart failure: Secondary | ICD-10-CM | POA: Diagnosis not present

## 2018-07-13 DIAGNOSIS — I5022 Chronic systolic (congestive) heart failure: Secondary | ICD-10-CM

## 2018-07-13 NOTE — Progress Notes (Signed)
Daily Session Note  Patient Details  Name: Kevin Murillo MRN: [id number] Date of Birth: June 12, 1946 Referring Provider:     CARDIAC REHAB PHASE II ORIENTATION from 07/05/2018 in [place]  Referring Provider  Kevin Dresser MD       Encounter Date: 07/13/2018  Check In:   Capillary Blood Glucose: No results found for this or any previous visit (from the past 24 hour(s)).  Exercise Prescription Changes - 07/13/18 1500      Response to Exercise   Blood Pressure (Admit)  108/78    Blood Pressure (Exercise)  122/80    Blood Pressure (Exit)  118/78    Heart Rate (Admit)  77 bpm    Heart Rate (Exercise)  89 bpm    Heart Rate (Exit)  62 bpm    Rating of Perceived Exertion (Exercise)  11    Perceived Dyspnea (Exercise)  0    Symptoms  None     Comments  Pt oriented to exercise equipment     Duration  Continue with 30 min of aerobic exercise without signs/symptoms of physical distress.    Intensity  THRR unchanged      Progression   Progression  Continue to progress workloads to maintain intensity without signs/symptoms of physical distress.    Average METs  2.06      Resistance Training   Training Prescription  Yes    Weight  2lbs    Reps  10-15    Time  10 Minutes      Interval Training   Interval Training  No      NuStep   Level  2    SPM  75    Minutes  15    METs  1.9      Track   Laps  7    Minutes  10    METs  2.23       Social History   Tobacco Use  Smoking Status Former Smoker  . Types: Cigarettes  . Last attempt to quit: 03/12/2002  . Years since quitting: 16.3  Smokeless Tobacco Never Used    Goals Met:  No report of cardiac concerns or symptoms  Goals Unmet:  Not Applicable  Comments: Kevin Murillo started cardiac rehab today.  Pt tolerated light exercise without difficulty. VSS, telemetry-Sinus Rhythm with PVC's , asymptomatic.  Medication list reconciled. Pt denies barriers to medicaiton compliance.   PSYCHOSOCIAL ASSESSMENT:  PHQ-0. Pt exhibits positive coping skills, hopeful outlook with supportive family. No psychosocial needs identified at this time, no psychosocial interventions necessary.    Pt enjoys watching sports.   Pt oriented to exercise equipment and routine.    Understanding verbalized. Kevin Pall, RN,BSN 07/14/2018 2:37 PM   Dr. Fransico Murillo is Medical Director for Cardiac Rehab at [facility].

## 2018-07-16 ENCOUNTER — Encounter (HOSPITAL_COMMUNITY): Payer: PPO

## 2018-07-16 ENCOUNTER — Encounter (HOSPITAL_COMMUNITY)
Admission: RE | Admit: 2018-07-16 | Discharge: 2018-07-16 | Disposition: A | Discharge status: Home / Self Care | Payer: PPO | Source: Ambulatory Visit | Attending: Cardiology | Admitting: Cardiology

## 2018-07-16 DIAGNOSIS — I5022 Chronic systolic (congestive) heart failure: Secondary | ICD-10-CM

## 2018-07-16 DIAGNOSIS — I110 Hypertensive heart disease with heart failure: Secondary | ICD-10-CM | POA: Diagnosis not present

## 2018-07-18 ENCOUNTER — Encounter (HOSPITAL_COMMUNITY)
Admission: RE | Admit: 2018-07-18 | Discharge: 2018-07-18 | Disposition: A | Discharge status: Home / Self Care | Payer: PPO | Source: Ambulatory Visit | Attending: Cardiology | Admitting: Cardiology

## 2018-07-18 ENCOUNTER — Encounter (HOSPITAL_COMMUNITY): Payer: PPO

## 2018-07-18 DIAGNOSIS — I110 Hypertensive heart disease with heart failure: Secondary | ICD-10-CM | POA: Diagnosis not present

## 2018-07-18 DIAGNOSIS — I5022 Chronic systolic (congestive) heart failure: Secondary | ICD-10-CM

## 2018-07-20 ENCOUNTER — Encounter (HOSPITAL_COMMUNITY)
Admission: RE | Admit: 2018-07-20 | Discharge: 2018-07-20 | Disposition: A | Discharge status: Home / Self Care | Payer: PPO | Source: Ambulatory Visit | Attending: Cardiology | Admitting: Cardiology

## 2018-07-20 ENCOUNTER — Encounter (HOSPITAL_COMMUNITY): Payer: PPO

## 2018-07-20 DIAGNOSIS — I110 Hypertensive heart disease with heart failure: Secondary | ICD-10-CM | POA: Diagnosis not present

## 2018-07-20 DIAGNOSIS — I5022 Chronic systolic (congestive) heart failure: Secondary | ICD-10-CM

## 2018-07-23 ENCOUNTER — Encounter (HOSPITAL_COMMUNITY)
Admission: RE | Admit: 2018-07-23 | Discharge: 2018-07-23 | Disposition: A | Discharge status: Home / Self Care | Payer: PPO | Source: Ambulatory Visit | Attending: Cardiology | Admitting: Cardiology

## 2018-07-23 ENCOUNTER — Encounter (HOSPITAL_COMMUNITY): Payer: PPO

## 2018-07-23 DIAGNOSIS — I5022 Chronic systolic (congestive) heart failure: Secondary | ICD-10-CM

## 2018-07-23 DIAGNOSIS — I110 Hypertensive heart disease with heart failure: Secondary | ICD-10-CM | POA: Diagnosis not present

## 2018-07-25 ENCOUNTER — Encounter (HOSPITAL_COMMUNITY)
Admission: RE | Admit: 2018-07-25 | Discharge: 2018-07-25 | Disposition: A | Discharge status: Home / Self Care | Payer: PPO | Source: Ambulatory Visit | Attending: Cardiology | Admitting: Cardiology

## 2018-07-25 ENCOUNTER — Encounter (HOSPITAL_COMMUNITY): Payer: PPO

## 2018-07-25 DIAGNOSIS — I110 Hypertensive heart disease with heart failure: Secondary | ICD-10-CM | POA: Diagnosis not present

## 2018-07-25 DIAGNOSIS — I5022 Chronic systolic (congestive) heart failure: Secondary | ICD-10-CM

## 2018-07-25 NOTE — Progress Notes (Signed)
I have reviewed a Home Exercise Prescription with Adria Dill . Cray is not currently exercising at home. The patient was advised to walk 2-3 days a week for 30-45 minutes.  Braulio Conte and I discussed how to progress their exercise prescription. The patient stated that they understand the exercise prescription.  We reviewed exercise guidelines, target heart rate during exercise, RPE Scale, weather conditions, NTG use, endpoints for exercise, warmup and cool down. Patient is encouraged to come to me with any questions. I will continue to follow up with the patient to assist them with progression and safety.    Carma Lair MS, ACSM CEP 07/25/2018 2:01 PM

## 2018-07-27 ENCOUNTER — Encounter (HOSPITAL_COMMUNITY): Payer: PPO

## 2018-07-27 ENCOUNTER — Encounter (HOSPITAL_COMMUNITY)
Admission: RE | Admit: 2018-07-27 | Discharge: 2018-07-27 | Disposition: A | Discharge status: Home / Self Care | Payer: PPO | Source: Ambulatory Visit | Attending: Cardiology | Admitting: Cardiology

## 2018-07-27 DIAGNOSIS — I5022 Chronic systolic (congestive) heart failure: Secondary | ICD-10-CM

## 2018-07-27 DIAGNOSIS — I110 Hypertensive heart disease with heart failure: Secondary | ICD-10-CM | POA: Diagnosis not present

## 2018-08-01 ENCOUNTER — Encounter (HOSPITAL_COMMUNITY): Payer: Self-pay

## 2018-08-01 ENCOUNTER — Encounter (HOSPITAL_COMMUNITY)
Admission: RE | Admit: 2018-08-01 | Discharge: 2018-08-01 | Disposition: A | Discharge status: Home / Self Care | Payer: PPO | Source: Ambulatory Visit | Attending: Cardiology | Admitting: Cardiology

## 2018-08-01 ENCOUNTER — Other Ambulatory Visit: Payer: Self-pay | Admitting: Physician Assistant

## 2018-08-01 ENCOUNTER — Encounter (HOSPITAL_COMMUNITY): Payer: PPO

## 2018-08-01 DIAGNOSIS — Z87891 Personal history of nicotine dependence: Secondary | ICD-10-CM | POA: Insufficient documentation

## 2018-08-01 DIAGNOSIS — I5022 Chronic systolic (congestive) heart failure: Secondary | ICD-10-CM | POA: Diagnosis not present

## 2018-08-01 DIAGNOSIS — I428 Other cardiomyopathies: Secondary | ICD-10-CM

## 2018-08-01 DIAGNOSIS — G629 Polyneuropathy, unspecified: Secondary | ICD-10-CM | POA: Insufficient documentation

## 2018-08-01 DIAGNOSIS — I110 Hypertensive heart disease with heart failure: Secondary | ICD-10-CM | POA: Insufficient documentation

## 2018-08-01 DIAGNOSIS — E785 Hyperlipidemia, unspecified: Secondary | ICD-10-CM | POA: Insufficient documentation

## 2018-08-01 DIAGNOSIS — Z7982 Long term (current) use of aspirin: Secondary | ICD-10-CM | POA: Insufficient documentation

## 2018-08-01 DIAGNOSIS — Z79899 Other long term (current) drug therapy: Secondary | ICD-10-CM | POA: Insufficient documentation

## 2018-08-01 DIAGNOSIS — I5020 Unspecified systolic (congestive) heart failure: Secondary | ICD-10-CM | POA: Diagnosis present

## 2018-08-02 NOTE — Progress Notes (Signed)
Cardiac Individual Treatment Plan  Patient Details  Name: Kevin Murillo MRN: [id number] Date of Birth: 08/24/1946 Referring Provider:     CARDIAC REHAB PHASE II ORIENTATION from 07/05/2018 in [place]  Referring Provider  Larey Dresser MD       Initial Encounter Date:    CARDIAC REHAB PHASE II ORIENTATION from 07/05/2018 in [place]  Date  07/05/18      Visit Diagnosis: Heart failure, chronic systolic ([place])  Patient's Home Medications on Admission:  Current Outpatient Medications:  .  aspirin 81 MG tablet, Take 81 mg by mouth daily., Disp: , Rfl:  .  carvedilol (COREG) 25 MG tablet, Take 1 tablet (25 mg total) by mouth 2 (two) times daily., Disp: 60 tablet, Rfl: 11 .  sacubitril-valsartan (ENTRESTO) 49-51 MG, Take 1 tablet by mouth 2 (two) times daily., Disp: 60 tablet, Rfl: 11 .  simvastatin (ZOCOR) 40 MG tablet, Take 40 mg by mouth daily at 6 PM. , Disp: , Rfl:  .  spironolactone (ALDACTONE) 25 MG tablet, TAKE 1 TABLET BY MOUTH EVERY DAY, Disp: 30 tablet, Rfl: 6  Past Medical History: Past Medical History:  Diagnosis Date  . Chronic systolic CHF (congestive heart failure), NYHA class 1 ([place])   . History of echocardiogram    Echo 3/16: Mild LVH, EF 40-45%, anteroseptal akinesis, grade 1 diastolic dysfunction, moderate LAE // Echo 3/19: diff HK worse in basal and mid inf wall, mild LVH, EF 25-30, mild MR, mild LAE  . Hyperlipidemia   . Hypertension   . Neuropathy of hand   . Non-ischemic cardiomyopathy ([place])    a. Echo 6/11: 30-35% // b. LHC 7/11: EF 30-35% with diffuse hypokinesis, no angiographic CAD. SPEP and Fe studies unremarkable, TSH normal.//  c. Echo 1/12: EF 45% // d. Echo 6/14: EF 20-25%  //  e. Echo EF 25-30%, diffuse hypokinesis, prominent apical trabeculation // f. CPX 3/15: normal fxnal capacity  // g. CPX 3/16 low norma fxnal capacity  // h. Echo 3/16: mild LVH, EF 40-45%   . Obesity   .  PVC (premature ventricular contraction)   . Rosacea   . Tubular adenoma of colon 12/2010    Tobacco Use: Social History   Tobacco Use  Smoking Status Former Smoker  . Types: Cigarettes  . Last attempt to quit: 03/12/2002  . Years since quitting: 16.4  Smokeless Tobacco Never Used    Labs: Recent Chemical engineer    Labs for ITP Cardiac and Pulmonary Rehab Latest Ref Rng & Units 05/28/2010 07/28/2010 08/03/2010 03/09/2018 03/09/2018   Cholestrol 0 - 200 mg/dL 219(H) 151 144 - -   LDLCALC 0 - 99 mg/dL - 62 67 - -   LDLDIRECT mg/dL 121.7 - - - -   HDL >39.00 mg/dL 59.50 52.20 48.00 - -   Trlycerides 0.0 - 149.0 mg/dL 187.0(H) 186.0(H) 145.0 - -   HCO3 20.0 - 28.0 mmol/L - - - 26.2 25.4   TCO2 22 - 32 mmol/L - - - 28 27   ACIDBASEDEF 0.0 - 2.0 mmol/L - - - - 1.0   O2SAT % - - - 60.0 62.0      Capillary Blood Glucose: No results found for: GLUCAP   Exercise Target Goals: Exercise Program Goal: Individual exercise prescription set using results from initial 6 min walk test and THRR while considering  patient's activity barriers and safety.   Exercise Prescription Goal: Initial exercise prescription builds  to 30-45 minutes a day of aerobic activity, 2-3 days per week.  Home exercise guidelines will be given to patient during program as part of exercise prescription that the participant will acknowledge.  Activity Barriers & Risk Stratification: Activity Barriers & Cardiac Risk Stratification - 07/05/18 1132      Activity Barriers & Cardiac Risk Stratification   Activity Barriers  Arthritis;Other (comment)    Comments  Occasional Knee and Hip Pain    Cardiac Risk Stratification  High       6 Minute Walk: 6 Minute Walk    Row Name 07/05/18 1129         6 Minute Walk   Phase  Initial     Distance  900 feet     Walk Time  6 minutes     # of Rest Breaks  0     MPH  1.7     METS  1.5     RPE  11     Perceived Dyspnea   0     VO2 Peak  5.25     Symptoms  No      Resting HR  75 bpm     Resting BP  104/70     Resting Oxygen Saturation   98 %     Exercise Oxygen Saturation  during 6 min walk  96 %     Max Ex. HR  104 bpm     Max Ex. BP  118/76     2 Minute Post BP  118/72        Oxygen Initial Assessment:   Oxygen Re-Evaluation:   Oxygen Discharge (Final Oxygen Re-Evaluation):   Initial Exercise Prescription: Initial Exercise Prescription - 07/05/18 1100      Date of Initial Exercise RX and Referring Provider   Date  07/05/18    Referring Provider  Larey Dresser MD     Expected Discharge Date  09/28/18      Recumbant Bike   Level  1.5    Watts  10    Minutes  10    METs  2.3      NuStep   Level  2    SPM  75    Minutes  10    METs  1.5      Track   Laps  7    Minutes  10    METs  2.23      Prescription Details   Frequency (times per week)  3x    Duration  Progress to 30 minutes of continuous aerobic without signs/symptoms of physical distress      Intensity   THRR 40-80% of Max Heartrate  59-118    Ratings of Perceived Exertion  11-13    Perceived Dyspnea  0-4      Progression   Progression  Continue progressive overload as per policy without signs/symptoms or physical distress.      Resistance Training   Training Prescription  Yes    Weight  4lbs    Reps  10-15       Perform Capillary Blood Glucose checks as needed.  Exercise Prescription Changes: Exercise Prescription Changes    Row Name 07/13/18 1500 07/18/18 1638 07/27/18 1413         Response to Exercise   Blood Pressure (Admit)  108/78  134/80  120/72     Blood Pressure (Exercise)  122/80  128/84  130/80     Blood Pressure (Exit)  118/78  110/70  120/66     Heart Rate (Admit)  77 bpm  74 bpm  82 bpm     Heart Rate (Exercise)  89 bpm  107 bpm  106 bpm     Heart Rate (Exit)  62 bpm  67 bpm  71 bpm     Rating of Perceived Exertion (Exercise)  11  12  12      Perceived Dyspnea (Exercise)  0  0  0     Symptoms  None   None   None      Comments   Pt oriented to exercise equipment   None   -     Duration  Continue with 30 min of aerobic exercise without signs/symptoms of physical distress.  Continue with 30 min of aerobic exercise without signs/symptoms of physical distress.  Continue with 30 min of aerobic exercise without signs/symptoms of physical distress.     Intensity  THRR unchanged  THRR unchanged  THRR unchanged       Progression   Progression  Continue to progress workloads to maintain intensity without signs/symptoms of physical distress.  Continue to progress workloads to maintain intensity without signs/symptoms of physical distress.  Continue to progress workloads to maintain intensity without signs/symptoms of physical distress.     Average METs  2.06  2.44  2.42       Resistance Training   Training Prescription  Yes  No  Yes     Weight  2lbs  -  4lbs     Reps  10-15  -  10-15     Time  10 Minutes  -  10 Minutes       Interval Training   Interval Training  No  No  No       NuStep   Level  2  2  3      SPM  75  85  90     Minutes  15  10  10      METs  1.9  2.7  2.5       Arm Ergometer   Level  -  1  2     Watts  -  25  25     Minutes  -  10  10     METs  -  2.5  2       Track   Laps  7  10  7      Minutes  10  10  10      METs  2.23  2.76  2.23       Home Exercise Plan   Plans to continue exercise at  -  -  Home (comment) Walking     Frequency  -  -  Add 2 additional days to program exercise sessions.     Initial Home Exercises Provided  -  -  07/25/18        Exercise Comments: Exercise Comments    Row Name 07/13/18 1502 07/25/18 1401         Exercise Comments  Pt's first day of exercise. Pt responeded well to exercise workloads. Will continue to monitor and progress pt as tolerated.   Reviewed HEP with pt. Pt is not exercising at home. Pt plans to start walking for exercise. Will continue to follow up with pt regarding exercising at home.          Exercise Goals and Review: Exercise Goals    Row  Name 07/05/18 [phone]  Exercise Goals   Increase Physical Activity  Yes       Intervention  Develop an individualized exercise prescription for aerobic and resistive training based on initial evaluation findings, risk stratification, comorbidities and participant's personal goals.;Provide advice, education, support and counseling about physical activity/exercise needs.       Expected Outcomes  Short Term: Attend rehab on a regular basis to increase amount of physical activity.;Long Term: Add in home exercise to make exercise part of routine and to increase amount of physical activity.;Long Term: Exercising regularly at least 3-5 days a week.       Increase Strength and Stamina  Yes       Intervention  Provide advice, education, support and counseling about physical activity/exercise needs.;Develop an individualized exercise prescription for aerobic and resistive training based on initial evaluation findings, risk stratification, comorbidities and participant's personal goals.       Expected Outcomes  Short Term: Increase workloads from initial exercise prescription for resistance, speed, and METs.;Short Term: Perform resistance training exercises routinely during rehab and add in resistance training at home;Long Term: Improve cardiorespiratory fitness, muscular endurance and strength as measured by increased METs and functional capacity (6MWT)       Able to understand and use rate of perceived exertion (RPE) scale  Yes       Intervention  Provide education and explanation on how to use RPE scale       Expected Outcomes  Short Term: Able to use RPE daily in rehab to express subjective intensity level;Long Term:  Able to use RPE to guide intensity level when exercising independently       Knowledge and understanding of Target Heart Rate Range (THRR)  Yes       Intervention  Provide education and explanation of THRR including how the numbers were predicted and where they are located for  reference       Expected Outcomes  Short Term: Able to state/look up THRR;Short Term: Able to use daily as guideline for intensity in rehab;Long Term: Able to use THRR to govern intensity when exercising independently       Able to check pulse independently  Yes       Intervention  Provide education and demonstration on how to check pulse in carotid and radial arteries.;Review the importance of being able to check your own pulse for safety during independent exercise       Expected Outcomes  Short Term: Able to explain why pulse checking is important during independent exercise;Long Term: Able to check pulse independently and accurately       Understanding of Exercise Prescription  Yes       Intervention  Provide education, explanation, and written materials on patient's individual exercise prescription       Expected Outcomes  Short Term: Able to explain program exercise prescription;Long Term: Able to explain home exercise prescription to exercise independently          Exercise Goals Re-Evaluation : Exercise Goals Re-Evaluation    Row Name 07/25/18 1402             Exercise Goal Re-Evaluation   Exercise Goals Review  Increase Physical Activity;Able to understand and use rate of perceived exertion (RPE) scale;Knowledge and understanding of Target Heart Rate Range (THRR);Understanding of Exercise Prescription;Increase Strength and Stamina;Able to check pulse independently       Comments  Reviewed HEP with pt. Also reviewed THRR, RPE Scale, weather precautions, endpoints of exercise, NTG use, warmup and cool down.  Expected Outcomes  Pt plans to walk 2 days a week for 10-15 minutes. Pt will gradually work to increase walking time. Pt will continue to increase stamina. Will continue to monitor pt.          Discharge Exercise Prescription (Final Exercise Prescription Changes): Exercise Prescription Changes - 07/27/18 1413      Response to Exercise   Blood Pressure (Admit)  120/72     Blood Pressure (Exercise)  130/80    Blood Pressure (Exit)  120/66    Heart Rate (Admit)  82 bpm    Heart Rate (Exercise)  106 bpm    Heart Rate (Exit)  71 bpm    Rating of Perceived Exertion (Exercise)  12    Perceived Dyspnea (Exercise)  0    Symptoms  None     Duration  Continue with 30 min of aerobic exercise without signs/symptoms of physical distress.    Intensity  THRR unchanged      Progression   Progression  Continue to progress workloads to maintain intensity without signs/symptoms of physical distress.    Average METs  2.42      Resistance Training   Training Prescription  Yes    Weight  4lbs    Reps  10-15    Time  10 Minutes      Interval Training   Interval Training  No      NuStep   Level  3    SPM  90    Minutes  10    METs  2.5      Arm Ergometer   Level  2    Watts  25    Minutes  10    METs  2      Track   Laps  7    Minutes  10    METs  2.23      Home Exercise Plan   Plans to continue exercise at  Home (comment)   Walking   Frequency  Add 2 additional days to program exercise sessions.    Initial Home Exercises Provided  07/25/18       Nutrition:  Target Goals: Understanding of nutrition guidelines, daily intake of sodium 1500mg , cholesterol 200mg , calories 30% from fat and 7% or less from saturated fats, daily to have 5 or more servings of fruits and vegetables.  Biometrics: Pre Biometrics - 07/05/18 1133      Pre Biometrics   Height  5' 8.5" (1.74 m)    Weight  108.4 kg    Waist Circumference  49 inches    Hip Circumference  47 inches    Waist to Hip Ratio  1.04 %    BMI (Calculated)  35.8    Triceps Skinfold  25 mm    % Body Fat  37.1 %    Grip Strength  38 kg    Flexibility  9.5 in    Single Leg Stand  4.87 seconds        Nutrition Therapy Plan and Nutrition Goals: Nutrition Therapy & Goals - 07/05/18 0901      Nutrition Therapy   Diet  heart healthy      Personal Nutrition Goals   Nutrition Goal  Pt to identify  and limit food sources of saturated fat, trans fat, and sodium    Personal Goal #2  Pt to identify food quantities necessary to achieve weight loss of 6-24 lbs. at graduation from cardiac rehab    Personal Goal #3  Pt to decrease number of meals eaten away from home      Intervention Plan   Intervention  Prescribe, educate and counsel regarding individualized specific dietary modifications aiming towards targeted core components such as weight, hypertension, lipid management, diabetes, heart failure and other comorbidities.    Expected Outcomes  Short Term Goal: Understand basic principles of dietary content, such as calories, fat, sodium, cholesterol and nutrients.       Nutrition Assessments: Nutrition Assessments - 07/05/18 0907      MEDFICTS Scores   Pre Score  --   did not complete      Nutrition Goals Re-Evaluation: Nutrition Goals Re-Evaluation    [place] Name 07/05/18 0901             Goals   Current Weight  238 lb 15.7 oz (108.4 kg)          Nutrition Goals Re-Evaluation: Nutrition Goals Re-Evaluation    [place] Name 07/05/18 0901             Goals   Current Weight  238 lb 15.7 oz (108.4 kg)          Nutrition Goals Discharge (Final Nutrition Goals Re-Evaluation): Nutrition Goals Re-Evaluation - 07/05/18 0901      Goals   Current Weight  238 lb 15.7 oz (108.4 kg)       Psychosocial: Target Goals: Acknowledge presence or absence of significant depression and/or stress, maximize coping skills, provide positive support system. Participant is able to verbalize types and ability to use techniques and skills needed for reducing stress and depression.  Initial Review & Psychosocial Screening: Initial Psych Review & Screening - 07/05/18 1033      Initial Review   Current issues with  None Identified      Family Dynamics   Good Support System?  Yes   Andreus reports his family as a source of support for him.      Barriers   Psychosocial barriers to  participate in program  There are no identifiable barriers or psychosocial needs.      Screening Interventions   Interventions  Encouraged to exercise       Quality of Life Scores: Quality of Life - 07/05/18 1033      Quality of Life   Select  Quality of Life      Quality of Life Scores   Health/Function Pre  24.33 %    Socioeconomic Pre  28.5 %    Psych/Spiritual Pre  26.57 %    Family Pre  25.2 %    GLOBAL Pre  25.86 %      Scores of 19 and below usually indicate a poorer quality of life in these areas.  A difference of  2-3 points is a clinically meaningful difference.  A difference of 2-3 points in the total score of the Quality of Life Index has been associated with significant improvement in overall quality of life, self-image, physical symptoms, and general health in studies assessing change in quality of life.  PHQ-9: Recent Review Flowsheet Data    Depression screen [facility] 2/9 07/13/2018   Decreased Interest 0   Down, Depressed, Hopeless 0   PHQ - 2 Score 0     Interpretation of Total Score  Total Score Depression Severity:  1-4 = Minimal depression, 5-9 = Mild depression, 10-14 = Moderate depression, 15-19 = Moderately severe depression, 20-27 = Severe depression   Psychosocial Evaluation and Intervention: Psychosocial Evaluation - 08/01/18 1726  Psychosocial Evaluation & Interventions   Interventions  Encouraged to exercise with the program and follow exercise prescription    Comments  No intervnetions neccessary.    Expected Outcomes  Baker will continue to have a positive outlook.    Continue Psychosocial Services   No Follow up required       Psychosocial Re-Evaluation: Psychosocial Re-Evaluation    [place] Name 08/01/18 1726             Psychosocial Re-Evaluation   Current issues with  None Identified       Comments  No intervention necessary.       Expected Outcomes  Hiro will continue to have a positive outlook with good coping skills.        Interventions  Encouraged to attend Cardiac Rehabilitation for the exercise       Continue Psychosocial Services   No Follow up required          Psychosocial Discharge (Final Psychosocial Re-Evaluation): Psychosocial Re-Evaluation - 08/01/18 1726      Psychosocial Re-Evaluation   Current issues with  None Identified    Comments  No intervention necessary.    Expected Outcomes  Jammal will continue to have a positive outlook with good coping skills.    Interventions  Encouraged to attend Cardiac Rehabilitation for the exercise    Continue Psychosocial Services   No Follow up required       Vocational Rehabilitation: Provide vocational rehab assistance to qualifying candidates.   Vocational Rehab Evaluation & Intervention: Vocational Rehab - 07/05/18 1034      Initial Vocational Rehab Evaluation & Intervention   Assessment shows need for Vocational Rehabilitation  No       Education: Education Goals: Education classes will be provided on a weekly basis, covering required topics. Participant will state understanding/return demonstration of topics presented.  Learning Barriers/Preferences: Learning Barriers/Preferences - 07/05/18 1145      Learning Barriers/Preferences   Learning Barriers  None    Learning Preferences  Written Material;Skilled Demonstration       Education Topics: Count Your Pulse:  -Group instruction provided by verbal instruction, demonstration, patient participation and written materials to support subject.  Instructors address importance of being able to find your pulse and how to count your pulse when at home without a heart monitor.  Patients get hands on experience counting their pulse with staff help and individually.   Heart Attack, Angina, and Risk Factor Modification:  -Group instruction provided by verbal instruction, video, and written materials to support subject.  Instructors address signs and symptoms of angina and heart attacks.     Also discuss risk factors for heart disease and how to make changes to improve heart health risk factors.   Functional Fitness:  -Group instruction provided by verbal instruction, demonstration, patient participation, and written materials to support subject.  Instructors address safety measures for doing things around the house.  Discuss how to get up and down off the floor, how to pick things up properly, how to safely get out of a chair without assistance, and balance training.   CARDIAC REHAB PHASE II EXERCISE from 07/20/2018 in [place]  Date  07/13/18  Instruction Review Code  2- Demonstrated Understanding      Meditation and Mindfulness:  -Group instruction provided by verbal instruction, patient participation, and written materials to support subject.  Instructor addresses importance of mindfulness and meditation practice to help reduce stress and improve awareness.  Instructor also leads  participants through a meditation exercise.    Stretching for Flexibility and Mobility:  -Group instruction provided by verbal instruction, patient participation, and written materials to support subject.  Instructors lead participants through series of stretches that are designed to increase flexibility thus improving mobility.  These stretches are additional exercise for major muscle groups that are typically performed during regular warm up and cool down.   Hands Only CPR:  -Group verbal, video, and participation provides a basic overview of AHA guidelines for community CPR. Role-play of emergencies allow participants the opportunity to practice calling for help and chest compression technique with discussion of AED use.   Hypertension: -Group verbal and written instruction that provides a basic overview of hypertension including the most recent diagnostic guidelines, risk factor reduction with self-care instructions and medication management.   CARDIAC REHAB  PHASE II EXERCISE from 07/20/2018 in [place]  Date  07/20/18  Instruction Review Code  2- Demonstrated Understanding       Nutrition I class: Heart Healthy Eating:  -Group instruction provided by PowerPoint slides, verbal discussion, and written materials to support subject matter. The instructor gives an explanation and review of the Therapeutic Lifestyle Changes diet recommendations, which includes a discussion on lipid goals, dietary fat, sodium, fiber, plant stanol/sterol esters, sugar, and the components of a well-balanced, healthy diet.   Nutrition II class: Lifestyle Skills:  -Group instruction provided by PowerPoint slides, verbal discussion, and written materials to support subject matter. The instructor gives an explanation and review of label reading, grocery shopping for heart health, heart healthy recipe modifications, and ways to make healthier choices when eating out.   Diabetes Question & Answer:  -Group instruction provided by PowerPoint slides, verbal discussion, and written materials to support subject matter. The instructor gives an explanation and review of diabetes co-morbidities, pre- and post-prandial blood glucose goals, pre-exercise blood glucose goals, signs, symptoms, and treatment of hypoglycemia and hyperglycemia, and foot care basics.   Diabetes Blitz:  -Group instruction provided by PowerPoint slides, verbal discussion, and written materials to support subject matter. The instructor gives an explanation and review of the physiology behind type 1 and type 2 diabetes, diabetes medications and rational behind using different medications, pre- and post-prandial blood glucose recommendations and Hemoglobin A1c goals, diabetes diet, and exercise including blood glucose guidelines for exercising safely.    Portion Distortion:  -Group instruction provided by PowerPoint slides, verbal discussion, written materials, and food models to  support subject matter. The instructor gives an explanation of serving size versus portion size, changes in portions sizes over the last 20 years, and what consists of a serving from each food group.   Stress Management:  -Group instruction provided by verbal instruction, video, and written materials to support subject matter.  Instructors review role of stress in heart disease and how to cope with stress positively.     Exercising on Your Own:  -Group instruction provided by verbal instruction, power point, and written materials to support subject.  Instructors discuss benefits of exercise, components of exercise, frequency and intensity of exercise, and end points for exercise.  Also discuss use of nitroglycerin and activating EMS.  Review options of places to exercise outside of rehab.  Review guidelines for sex with heart disease.   CARDIAC REHAB PHASE II EXERCISE from 07/20/2018 in [place]  Date  07/18/18  Educator  EP  Instruction Review Code  2- Demonstrated Understanding      Cardiac Drugs  I:  -Group instruction provided by verbal instruction and written materials to support subject.  Instructor reviews cardiac drug classes: antiplatelets, anticoagulants, beta blockers, and statins.  Instructor discusses reasons, side effects, and lifestyle considerations for each drug class.   Cardiac Drugs II:  -Group instruction provided by verbal instruction and written materials to support subject.  Instructor reviews cardiac drug classes: angiotensin converting enzyme inhibitors (ACE-I), angiotensin II receptor blockers (ARBs), nitrates, and calcium channel blockers.  Instructor discusses reasons, side effects, and lifestyle considerations for each drug class.   Anatomy and Physiology of the Circulatory System:  Group verbal and written instruction and models provide basic cardiac anatomy and physiology, with the coronary electrical and arterial systems. Review  of: AMI, Angina, Valve disease, Heart Failure, Peripheral Artery Disease, Cardiac Arrhythmia, Pacemakers, and the ICD.   Other Education:  -Group or individual verbal, written, or video instructions that support the educational goals of the cardiac rehab program.   Holiday Eating Survival Tips:  -Group instruction provided by PowerPoint slides, verbal discussion, and written materials to support subject matter. The instructor gives patients tips, tricks, and techniques to help them not only survive but enjoy the holidays despite the onslaught of food that accompanies the holidays.   Knowledge Questionnaire Score: Knowledge Questionnaire Score - 07/05/18 1034      Knowledge Questionnaire Score   Pre Score  23/24       Core Components/Risk Factors/Patient Goals at Admission: Personal Goals and Risk Factors at Admission - 08/02/18 0658      Core Components/Risk Factors/Patient Goals on Admission    Weight Management  Obesity;Yes    Intervention  Weight Management: Develop a combined nutrition and exercise program designed to reach desired caloric intake, while maintaining appropriate intake of nutrient and fiber, sodium and fats, and appropriate energy expenditure required for the weight goal.;Weight Management/Obesity: Establish reasonable short term and long term weight goals.;Weight Management: Provide education and appropriate resources to help participant work on and attain dietary goals.;Obesity: Provide education and appropriate resources to help participant work on and attain dietary goals.    Admit Weight  238 lb 15.7 oz (108.4 kg)    Goal Weight: Long Term  228 lb (103.4 kg)    Expected Outcomes  Short Term: Continue to assess and modify interventions until short term weight is achieved    Hypertension  Yes    Intervention  Provide education on lifestyle modifcations including regular physical activity/exercise, weight management, moderate sodium restriction and increased  consumption of fresh fruit, vegetables, and low fat dairy, alcohol moderation, and smoking cessation.;Monitor prescription use compliance.    Expected Outcomes  Short Term: Continued assessment and intervention until BP is < 140/72mm HG in hypertensive participants. < 130/72mm HG in hypertensive participants with diabetes, heart failure or chronic kidney disease.;Long Term: Maintenance of blood pressure at goal levels.    Lipids  Yes    Intervention  Provide education and support for participant on nutrition & aerobic/resistive exercise along with prescribed medications to achieve LDL 70mg , HDL >40mg .    Expected Outcomes  Short Term: Participant states understanding of desired cholesterol values and is compliant with medications prescribed. Participant is following exercise prescription and nutrition guidelines.;Long Term: Cholesterol controlled with medications as prescribed, with individualized exercise RX and with personalized nutrition plan. Value goals: LDL < 70mg , HDL > 40 mg.       Core Components/Risk Factors/Patient Goals Review:  Goals and Risk Factor Review    Row Name 08/02/18 [phone]  Core Components/Risk Factors/Patient Goals Review   Personal Goals Review  Weight Management/Obesity;Hypertension;Lipids       Review  Pt willing to participate in CR exercise.  Lambros would like to increase his strength and stamina.  He is tolerating exercise well so far.        Expected Outcomes  Deklyn will participate in CR exercise, nutrition, and lifestyle modification opportunities.           Core Components/Risk Factors/Patient Goals at Discharge (Final Review):  Goals and Risk Factor Review - 08/02/18 0708      Core Components/Risk Factors/Patient Goals Review   Personal Goals Review  Weight Management/Obesity;Hypertension;Lipids    Review  Pt willing to participate in CR exercise.  Jalan would like to increase his strength and stamina.  He is tolerating exercise well  so far.     Expected Outcomes  Daryll will participate in CR exercise, nutrition, and lifestyle modification opportunities.        ITP Comments: ITP Comments    Row Name 07/05/18 1128 08/01/18 1725         ITP Comments  Dr. Fransico Him, Medical Director   30 Day ITP Review.  Pt has tolerated starting exercise. His vital signs are stable.          Comments: See ITP Comments.

## 2018-08-03 ENCOUNTER — Encounter (HOSPITAL_COMMUNITY): Payer: PPO

## 2018-08-03 ENCOUNTER — Encounter (HOSPITAL_COMMUNITY)
Admission: RE | Admit: 2018-08-03 | Discharge: 2018-08-03 | Disposition: A | Discharge status: Home / Self Care | Payer: PPO | Source: Ambulatory Visit | Attending: Cardiology | Admitting: Cardiology

## 2018-08-03 DIAGNOSIS — I5022 Chronic systolic (congestive) heart failure: Secondary | ICD-10-CM

## 2018-08-03 DIAGNOSIS — I110 Hypertensive heart disease with heart failure: Secondary | ICD-10-CM | POA: Diagnosis not present

## 2018-08-06 ENCOUNTER — Encounter (HOSPITAL_COMMUNITY)
Admission: RE | Admit: 2018-08-06 | Discharge: 2018-08-06 | Disposition: A | Discharge status: Home / Self Care | Payer: PPO | Source: Ambulatory Visit | Attending: Cardiology | Admitting: Cardiology

## 2018-08-06 ENCOUNTER — Encounter (HOSPITAL_COMMUNITY): Payer: PPO

## 2018-08-06 DIAGNOSIS — I110 Hypertensive heart disease with heart failure: Secondary | ICD-10-CM | POA: Diagnosis not present

## 2018-08-06 DIAGNOSIS — I5022 Chronic systolic (congestive) heart failure: Secondary | ICD-10-CM

## 2018-08-08 ENCOUNTER — Encounter (HOSPITAL_COMMUNITY)
Admission: RE | Admit: 2018-08-08 | Discharge: 2018-08-08 | Disposition: A | Discharge status: Home / Self Care | Payer: PPO | Source: Ambulatory Visit | Attending: Cardiology | Admitting: Cardiology

## 2018-08-08 ENCOUNTER — Encounter (HOSPITAL_COMMUNITY): Payer: PPO

## 2018-08-08 DIAGNOSIS — I5022 Chronic systolic (congestive) heart failure: Secondary | ICD-10-CM

## 2018-08-08 DIAGNOSIS — I110 Hypertensive heart disease with heart failure: Secondary | ICD-10-CM | POA: Diagnosis not present

## 2018-08-10 ENCOUNTER — Encounter (HOSPITAL_COMMUNITY)
Admission: RE | Admit: 2018-08-10 | Discharge: 2018-08-10 | Disposition: A | Discharge status: Home / Self Care | Payer: PPO | Source: Ambulatory Visit | Attending: Cardiology | Admitting: Cardiology

## 2018-08-10 ENCOUNTER — Encounter (HOSPITAL_COMMUNITY): Payer: PPO

## 2018-08-10 DIAGNOSIS — I110 Hypertensive heart disease with heart failure: Secondary | ICD-10-CM | POA: Diagnosis not present

## 2018-08-10 DIAGNOSIS — I5022 Chronic systolic (congestive) heart failure: Secondary | ICD-10-CM

## 2018-08-13 ENCOUNTER — Encounter (HOSPITAL_COMMUNITY): Payer: PPO

## 2018-08-13 ENCOUNTER — Encounter (HOSPITAL_COMMUNITY)
Admission: RE | Admit: 2018-08-13 | Discharge: 2018-08-13 | Disposition: A | Discharge status: Home / Self Care | Payer: PPO | Source: Ambulatory Visit | Attending: Cardiology | Admitting: Cardiology

## 2018-08-13 DIAGNOSIS — I5022 Chronic systolic (congestive) heart failure: Secondary | ICD-10-CM

## 2018-08-13 DIAGNOSIS — I110 Hypertensive heart disease with heart failure: Secondary | ICD-10-CM | POA: Diagnosis not present

## 2018-08-15 ENCOUNTER — Encounter (HOSPITAL_COMMUNITY): Payer: PPO

## 2018-08-15 ENCOUNTER — Encounter (HOSPITAL_COMMUNITY)
Admission: RE | Admit: 2018-08-15 | Discharge: 2018-08-15 | Disposition: A | Discharge status: Home / Self Care | Payer: PPO | Source: Ambulatory Visit | Attending: Cardiology | Admitting: Cardiology

## 2018-08-15 DIAGNOSIS — I5022 Chronic systolic (congestive) heart failure: Secondary | ICD-10-CM

## 2018-08-15 DIAGNOSIS — I110 Hypertensive heart disease with heart failure: Secondary | ICD-10-CM | POA: Diagnosis not present

## 2018-08-17 ENCOUNTER — Encounter (HOSPITAL_COMMUNITY)
Admission: RE | Admit: 2018-08-17 | Discharge: 2018-08-17 | Disposition: A | Discharge status: Home / Self Care | Payer: PPO | Source: Ambulatory Visit | Attending: Cardiology | Admitting: Cardiology

## 2018-08-17 ENCOUNTER — Encounter (HOSPITAL_COMMUNITY): Payer: PPO

## 2018-08-17 DIAGNOSIS — I110 Hypertensive heart disease with heart failure: Secondary | ICD-10-CM | POA: Diagnosis not present

## 2018-08-17 DIAGNOSIS — I5022 Chronic systolic (congestive) heart failure: Secondary | ICD-10-CM

## 2018-08-20 ENCOUNTER — Encounter (HOSPITAL_COMMUNITY): Payer: PPO

## 2018-08-20 ENCOUNTER — Encounter (HOSPITAL_COMMUNITY)
Admission: RE | Admit: 2018-08-20 | Discharge: 2018-08-20 | Disposition: A | Discharge status: Home / Self Care | Payer: PPO | Source: Ambulatory Visit | Attending: Cardiology | Admitting: Cardiology

## 2018-08-20 DIAGNOSIS — I5022 Chronic systolic (congestive) heart failure: Secondary | ICD-10-CM

## 2018-08-20 DIAGNOSIS — I110 Hypertensive heart disease with heart failure: Secondary | ICD-10-CM | POA: Diagnosis not present

## 2018-08-22 ENCOUNTER — Encounter (HOSPITAL_COMMUNITY): Payer: PPO

## 2018-08-22 ENCOUNTER — Encounter (HOSPITAL_COMMUNITY)
Admission: RE | Admit: 2018-08-22 | Discharge: 2018-08-22 | Disposition: A | Discharge status: Home / Self Care | Payer: PPO | Source: Ambulatory Visit | Attending: Cardiology | Admitting: Cardiology

## 2018-08-22 DIAGNOSIS — I110 Hypertensive heart disease with heart failure: Secondary | ICD-10-CM | POA: Diagnosis not present

## 2018-08-22 DIAGNOSIS — I5022 Chronic systolic (congestive) heart failure: Secondary | ICD-10-CM

## 2018-08-24 ENCOUNTER — Encounter (HOSPITAL_COMMUNITY): Payer: PPO

## 2018-08-24 ENCOUNTER — Encounter (HOSPITAL_COMMUNITY)
Admission: RE | Admit: 2018-08-24 | Discharge: 2018-08-24 | Disposition: A | Discharge status: Home / Self Care | Payer: PPO | Source: Ambulatory Visit | Attending: Cardiology | Admitting: Cardiology

## 2018-08-24 DIAGNOSIS — I110 Hypertensive heart disease with heart failure: Secondary | ICD-10-CM | POA: Diagnosis not present

## 2018-08-24 DIAGNOSIS — I5022 Chronic systolic (congestive) heart failure: Secondary | ICD-10-CM

## 2018-08-27 ENCOUNTER — Encounter (HOSPITAL_COMMUNITY)
Admission: RE | Admit: 2018-08-27 | Discharge: 2018-08-27 | Disposition: A | Discharge status: Home / Self Care | Payer: PPO | Source: Ambulatory Visit | Attending: Cardiology | Admitting: Cardiology

## 2018-08-27 ENCOUNTER — Encounter (HOSPITAL_COMMUNITY): Payer: PPO

## 2018-08-27 DIAGNOSIS — I110 Hypertensive heart disease with heart failure: Secondary | ICD-10-CM | POA: Diagnosis not present

## 2018-08-27 DIAGNOSIS — I5022 Chronic systolic (congestive) heart failure: Secondary | ICD-10-CM

## 2018-08-29 ENCOUNTER — Encounter (HOSPITAL_COMMUNITY): Payer: PPO

## 2018-08-29 ENCOUNTER — Encounter (HOSPITAL_COMMUNITY)
Admission: RE | Admit: 2018-08-29 | Discharge: 2018-08-29 | Disposition: A | Discharge status: Home / Self Care | Payer: PPO | Source: Ambulatory Visit | Attending: Cardiology | Admitting: Cardiology

## 2018-08-29 DIAGNOSIS — I5022 Chronic systolic (congestive) heart failure: Secondary | ICD-10-CM | POA: Insufficient documentation

## 2018-08-29 DIAGNOSIS — Z79899 Other long term (current) drug therapy: Secondary | ICD-10-CM | POA: Diagnosis not present

## 2018-08-29 DIAGNOSIS — I5020 Unspecified systolic (congestive) heart failure: Secondary | ICD-10-CM | POA: Diagnosis present

## 2018-08-29 DIAGNOSIS — I110 Hypertensive heart disease with heart failure: Secondary | ICD-10-CM | POA: Diagnosis not present

## 2018-08-29 DIAGNOSIS — I428 Other cardiomyopathies: Secondary | ICD-10-CM | POA: Diagnosis not present

## 2018-08-29 DIAGNOSIS — G629 Polyneuropathy, unspecified: Secondary | ICD-10-CM | POA: Diagnosis not present

## 2018-08-29 DIAGNOSIS — Z87891 Personal history of nicotine dependence: Secondary | ICD-10-CM | POA: Diagnosis not present

## 2018-08-29 DIAGNOSIS — E785 Hyperlipidemia, unspecified: Secondary | ICD-10-CM | POA: Diagnosis not present

## 2018-08-29 DIAGNOSIS — Z7982 Long term (current) use of aspirin: Secondary | ICD-10-CM | POA: Insufficient documentation

## 2018-08-30 ENCOUNTER — Encounter (HOSPITAL_COMMUNITY): Payer: Self-pay

## 2018-08-30 NOTE — Progress Notes (Signed)
Cardiac Individual Treatment Plan  Patient Details  Name: CORIE VAVRA MRN: [id number] Date of Birth: 1946/03/09 Referring Provider:     CARDIAC REHAB PHASE II ORIENTATION from 07/05/2018 in [place]  Referring Provider  Larey Dresser MD       Initial Encounter Date:    CARDIAC REHAB PHASE II ORIENTATION from 07/05/2018 in [facility]  Date  07/05/18      Visit Diagnosis: Heart failure, chronic systolic ([place])  Patient's Home Medications on Admission:  Current Outpatient Medications:  .  aspirin 81 MG tablet, Take 81 mg by mouth daily., Disp: , Rfl:  .  carvedilol (COREG) 25 MG tablet, Take 1 tablet (25 mg total) by mouth 2 (two) times daily., Disp: 60 tablet, Rfl: 11 .  sacubitril-valsartan (ENTRESTO) 49-51 MG, Take 1 tablet by mouth 2 (two) times daily., Disp: 60 tablet, Rfl: 11 .  simvastatin (ZOCOR) 40 MG tablet, Take 40 mg by mouth daily at 6 PM. , Disp: , Rfl:  .  spironolactone (ALDACTONE) 25 MG tablet, TAKE 1 TABLET BY MOUTH EVERY DAY, Disp: 30 tablet, Rfl: 6  Past Medical History: Past Medical History:  Diagnosis Date  . Chronic systolic CHF (congestive heart failure), NYHA class 1 ([place])   . History of echocardiogram    Echo 3/16: Mild LVH, EF 40-45%, anteroseptal akinesis, grade 1 diastolic dysfunction, moderate LAE // Echo 3/19: diff HK worse in basal and mid inf wall, mild LVH, EF 25-30, mild MR, mild LAE  . Hyperlipidemia   . Hypertension   . Neuropathy of hand   . Non-ischemic cardiomyopathy ([place])    a. Echo 6/11: 30-35% // b. LHC 7/11: EF 30-35% with diffuse hypokinesis, no angiographic CAD. SPEP and Fe studies unremarkable, TSH normal.//  c. Echo 1/12: EF 45% // d. Echo 6/14: EF 20-25%  //  e. Echo EF 25-30%, diffuse hypokinesis, prominent apical trabeculation // f. CPX 3/15: normal fxnal capacity  // g. CPX 3/16 low norma fxnal capacity  // h. Echo 3/16: mild LVH, EF 40-45%   . Obesity   .  PVC (premature ventricular contraction)   . Rosacea   . Tubular adenoma of colon 12/2010    Tobacco Use: Social History   Tobacco Use  Smoking Status Former Smoker  . Types: Cigarettes  . Last attempt to quit: 03/12/2002  . Years since quitting: 16.4  Smokeless Tobacco Never Used    Labs: Recent Chemical engineer    Labs for ITP Cardiac and Pulmonary Rehab Latest Ref Rng & Units 05/28/2010 07/28/2010 08/03/2010 03/09/2018 03/09/2018   Cholestrol 0 - 200 mg/dL 219(H) 151 144 - -   LDLCALC 0 - 99 mg/dL - 62 67 - -   LDLDIRECT mg/dL 121.7 - - - -   HDL >39.00 mg/dL 59.50 52.20 48.00 - -   Trlycerides 0.0 - 149.0 mg/dL 187.0(H) 186.0(H) 145.0 - -   HCO3 20.0 - 28.0 mmol/L - - - 26.2 25.4   TCO2 22 - 32 mmol/L - - - 28 27   ACIDBASEDEF 0.0 - 2.0 mmol/L - - - - 1.0   O2SAT % - - - 60.0 62.0      Capillary Blood Glucose: No results found for: GLUCAP   Exercise Target Goals: Exercise Program Goal: Individual exercise prescription set using results from initial 6 min walk test and THRR while considering  patient's activity barriers and safety.   Exercise Prescription Goal: Initial exercise prescription builds  to 30-45 minutes a day of aerobic activity, 2-3 days per week.  Home exercise guidelines will be given to patient during program as part of exercise prescription that the participant will acknowledge.  Activity Barriers & Risk Stratification: Activity Barriers & Cardiac Risk Stratification - 07/05/18 1132      Activity Barriers & Cardiac Risk Stratification   Activity Barriers  Arthritis;Other (comment)    Comments  Occasional Knee and Hip Pain    Cardiac Risk Stratification  High       6 Minute Walk: 6 Minute Walk    Row Name 07/05/18 1129         6 Minute Walk   Phase  Initial     Distance  900 feet     Walk Time  6 minutes     # of Rest Breaks  0     MPH  1.7     METS  1.5     RPE  11     Perceived Dyspnea   0     VO2 Peak  5.25     Symptoms  No      Resting HR  75 bpm     Resting BP  104/70     Resting Oxygen Saturation   98 %     Exercise Oxygen Saturation  during 6 min walk  96 %     Max Ex. HR  104 bpm     Max Ex. BP  118/76     2 Minute Post BP  118/72        Oxygen Initial Assessment:   Oxygen Re-Evaluation:   Oxygen Discharge (Final Oxygen Re-Evaluation):   Initial Exercise Prescription: Initial Exercise Prescription - 07/05/18 1100      Date of Initial Exercise RX and Referring Provider   Date  07/05/18    Referring Provider  Larey Dresser MD     Expected Discharge Date  09/28/18      Recumbant Bike   Level  1.5    Watts  10    Minutes  10    METs  2.3      NuStep   Level  2    SPM  75    Minutes  10    METs  1.5      Track   Laps  7    Minutes  10    METs  2.23      Prescription Details   Frequency (times per week)  3x    Duration  Progress to 30 minutes of continuous aerobic without signs/symptoms of physical distress      Intensity   THRR 40-80% of Max Heartrate  59-118    Ratings of Perceived Exertion  11-13    Perceived Dyspnea  0-4      Progression   Progression  Continue progressive overload as per policy without signs/symptoms or physical distress.      Resistance Training   Training Prescription  Yes    Weight  4lbs    Reps  10-15       Perform Capillary Blood Glucose checks as needed.  Exercise Prescription Changes: Exercise Prescription Changes    Row Name 07/13/18 1500 07/18/18 1638 07/27/18 1413 08/15/18 1500 08/29/18 0800     Response to Exercise   Blood Pressure (Admit)  108/78  134/80  120/72  124/78  110/70   Blood Pressure (Exercise)  122/80  128/84  130/80  132/78  128/82   Blood Pressure (  Exit)  118/78  110/70  120/66  122/78  118/70   Heart Rate (Admit)  77 bpm  74 bpm  82 bpm  77 bpm  75 bpm   Heart Rate (Exercise)  89 bpm  107 bpm  106 bpm  104 bpm  105 bpm   Heart Rate (Exit)  62 bpm  67 bpm  71 bpm  65 bpm  71 bpm   Rating of Perceived Exertion  (Exercise)  11  12  12  12  12    Perceived Dyspnea (Exercise)  0  0  0  0  0   Symptoms  None   None   None   None   None    Comments  Pt oriented to exercise equipment   None   -  None  None   Duration  Continue with 30 min of aerobic exercise without signs/symptoms of physical distress.  Continue with 30 min of aerobic exercise without signs/symptoms of physical distress.  Continue with 30 min of aerobic exercise without signs/symptoms of physical distress.  Continue with 30 min of aerobic exercise without signs/symptoms of physical distress.  Continue with 30 min of aerobic exercise without signs/symptoms of physical distress.   Intensity  THRR unchanged  THRR unchanged  THRR unchanged  THRR unchanged  THRR unchanged     Progression   Progression  Continue to progress workloads to maintain intensity without signs/symptoms of physical distress.  Continue to progress workloads to maintain intensity without signs/symptoms of physical distress.  Continue to progress workloads to maintain intensity without signs/symptoms of physical distress.  Continue to progress workloads to maintain intensity without signs/symptoms of physical distress.  Continue to progress workloads to maintain intensity without signs/symptoms of physical distress.   Average METs  2.06  2.44  2.42  2.43  2.66     Resistance Training   Training Prescription  Yes  No  Yes  No  No   Weight  2lbs  -  4lbs  -  -   Reps  10-15  -  10-15  -  -   Time  10 Minutes  -  10 Minutes  -  -     Interval Training   Interval Training  No  No  No  -  -     NuStep   Level  2  2  3  5  5    SPM  75  85  90  95  100   Minutes  15  10  10  10  10    METs  1.9  2.7  2.5  2.9  3.4     Arm Ergometer   Level  -  1  2  3  4    Watts  -  25  25  30  30    Minutes  -  10  10  10  10    METs  -  2.5  2  2.3  2.3     Track   Laps  7  10  7  7  8    Minutes  10  10  10  10  10    METs  2.23  2.76  2.23  2.23  2.38     Home Exercise Plan   Plans to  continue exercise at  -  -  Home (comment) Walking  Home (comment) Walking  Home (comment) Walking   Frequency  -  -  Add 2 additional days to program exercise  sessions.  Add 2 additional days to program exercise sessions.  Add 2 additional days to program exercise sessions.   Initial Home Exercises Provided  -  -  07/25/18  07/25/18  07/25/18      Exercise Comments: Exercise Comments    Row Name 07/13/18 1502 07/25/18 1401 08/30/18 0756       Exercise Comments  Pt's first day of exercise. Pt responeded well to exercise workloads. Will continue to monitor and progress pt as tolerated.   Reviewed HEP with pt. Pt is not exercising at home. Pt plans to start walking for exercise. Will continue to follow up with pt regarding exercising at home.   Reviewed METs and Goals with pt. Pt is continuing to respond well to exercise workloads. Pt is enjoying exercising in rehab. Will continue to monitor and progress pt.         Exercise Goals and Review: Exercise Goals    Row Name 07/05/18 1133             Exercise Goals   Increase Physical Activity  Yes       Intervention  Develop an individualized exercise prescription for aerobic and resistive training based on initial evaluation findings, risk stratification, comorbidities and participant's personal goals.;Provide advice, education, support and counseling about physical activity/exercise needs.       Expected Outcomes  Short Term: Attend rehab on a regular basis to increase amount of physical activity.;Long Term: Add in home exercise to make exercise part of routine and to increase amount of physical activity.;Long Term: Exercising regularly at least 3-5 days a week.       Increase Strength and Stamina  Yes       Intervention  Provide advice, education, support and counseling about physical activity/exercise needs.;Develop an individualized exercise prescription for aerobic and resistive training based on initial evaluation findings, risk  stratification, comorbidities and participant's personal goals.       Expected Outcomes  Short Term: Increase workloads from initial exercise prescription for resistance, speed, and METs.;Short Term: Perform resistance training exercises routinely during rehab and add in resistance training at home;Long Term: Improve cardiorespiratory fitness, muscular endurance and strength as measured by increased METs and functional capacity (6MWT)       Able to understand and use rate of perceived exertion (RPE) scale  Yes       Intervention  Provide education and explanation on how to use RPE scale       Expected Outcomes  Short Term: Able to use RPE daily in rehab to express subjective intensity level;Long Term:  Able to use RPE to guide intensity level when exercising independently       Knowledge and understanding of Target Heart Rate Range (THRR)  Yes       Intervention  Provide education and explanation of THRR including how the numbers were predicted and where they are located for reference       Expected Outcomes  Short Term: Able to state/look up THRR;Short Term: Able to use daily as guideline for intensity in rehab;Long Term: Able to use THRR to govern intensity when exercising independently       Able to check pulse independently  Yes       Intervention  Provide education and demonstration on how to check pulse in carotid and radial arteries.;Review the importance of being able to check your own pulse for safety during independent exercise       Expected Outcomes  Short Term: Able to explain  why pulse checking is important during independent exercise;Long Term: Able to check pulse independently and accurately       Understanding of Exercise Prescription  Yes       Intervention  Provide education, explanation, and written materials on patient's individual exercise prescription       Expected Outcomes  Short Term: Able to explain program exercise prescription;Long Term: Able to explain home exercise  prescription to exercise independently          Exercise Goals Re-Evaluation : Exercise Goals Re-Evaluation    Row Name 07/25/18 1402 08/30/18 0756           Exercise Goal Re-Evaluation   Exercise Goals Review  Increase Physical Activity;Able to understand and use rate of perceived exertion (RPE) scale;Knowledge and understanding of Target Heart Rate Range (THRR);Understanding of Exercise Prescription;Increase Strength and Stamina;Able to check pulse independently  Increase Physical Activity;Understanding of Exercise Prescription      Comments  Reviewed HEP with pt. Also reviewed THRR, RPE Scale, weather precautions, endpoints of exercise, NTG use, warmup and cool down.  Pt is responding well workload increases. Pt is now exercising at a level 4 on Arm Crank and level 5 on Nustep. Pt shows motivation to put forth effort while in rehab. Will continue to work with pt to be motivated to exercise on his own.       Expected Outcomes  Pt plans to walk 2 days a week for 10-15 minutes. Pt will gradually work to increase walking time. Pt will continue to increase stamina. Will continue to monitor pt.  Pt will continue to walk for exercise 2 days a week 15 minutes. Pt will continue to increase cardiorespiratory fitness. Will continue to monitor.          Discharge Exercise Prescription (Final Exercise Prescription Changes): Exercise Prescription Changes - 08/29/18 0800      Response to Exercise   Blood Pressure (Admit)  110/70    Blood Pressure (Exercise)  128/82    Blood Pressure (Exit)  118/70    Heart Rate (Admit)  75 bpm    Heart Rate (Exercise)  105 bpm    Heart Rate (Exit)  71 bpm    Rating of Perceived Exertion (Exercise)  12    Perceived Dyspnea (Exercise)  0    Symptoms  None     Comments  None    Duration  Continue with 30 min of aerobic exercise without signs/symptoms of physical distress.    Intensity  THRR unchanged      Progression   Progression  Continue to progress workloads  to maintain intensity without signs/symptoms of physical distress.    Average METs  2.66      Resistance Training   Training Prescription  No      NuStep   Level  5    SPM  100    Minutes  10    METs  3.4      Arm Ergometer   Level  4    Watts  30    Minutes  10    METs  2.3      Track   Laps  8    Minutes  10    METs  2.38      Home Exercise Plan   Plans to continue exercise at  Home (comment)   Walking   Frequency  Add 2 additional days to program exercise sessions.    Initial Home Exercises Provided  07/25/18  Nutrition:  Target Goals: Understanding of nutrition guidelines, daily intake of sodium 1500mg , cholesterol 200mg , calories 30% from fat and 7% or less from saturated fats, daily to have 5 or more servings of fruits and vegetables.  Biometrics: Pre Biometrics - 07/05/18 1133      Pre Biometrics   Height  5' 8.5" (1.74 m)    Weight  108.4 kg    Waist Circumference  49 inches    Hip Circumference  47 inches    Waist to Hip Ratio  1.04 %    BMI (Calculated)  35.8    Triceps Skinfold  25 mm    % Body Fat  37.1 %    Grip Strength  38 kg    Flexibility  9.5 in    Single Leg Stand  4.87 seconds        Nutrition Therapy Plan and Nutrition Goals: Nutrition Therapy & Goals - 07/05/18 0901      Nutrition Therapy   Diet  heart healthy      Personal Nutrition Goals   Nutrition Goal  Pt to identify and limit food sources of saturated fat, trans fat, and sodium    Personal Goal #2  Pt to identify food quantities necessary to achieve weight loss of 6-24 lbs. at graduation from cardiac rehab    Personal Goal #3  Pt to decrease number of meals eaten away from home      [place], educate and counsel regarding individualized specific dietary modifications aiming towards targeted core components such as weight, hypertension, lipid management, diabetes, heart failure and other comorbidities.    Expected Outcomes  Short  Term Goal: Understand basic principles of dietary content, such as calories, fat, sodium, cholesterol and nutrients.       Nutrition Assessments: Nutrition Assessments - 07/05/18 0907      MEDFICTS Scores   Pre Score  --   did not complete      Nutrition Goals Re-Evaluation: Nutrition Goals Re-Evaluation    [place] Name 07/05/18 0901             Goals   Current Weight  238 lb 15.7 oz (108.4 kg)          Nutrition Goals Re-Evaluation: Nutrition Goals Re-Evaluation    [place] Name 07/05/18 0901             Goals   Current Weight  238 lb 15.7 oz (108.4 kg)          Nutrition Goals Discharge (Final Nutrition Goals Re-Evaluation): Nutrition Goals Re-Evaluation - 07/05/18 0901      Goals   Current Weight  238 lb 15.7 oz (108.4 kg)       Psychosocial: Target Goals: Acknowledge presence or absence of significant depression and/or stress, maximize coping skills, provide positive support system. Participant is able to verbalize types and ability to use techniques and skills needed for reducing stress and depression.  Initial Review & Psychosocial Screening: Initial Psych Review & Screening - 07/05/18 1033      Initial Review   Current issues with  None Identified      Family Dynamics   Good Support System?  Yes   Kayhan reports his family as a source of support for him.      Barriers   Psychosocial barriers to participate in program  There are no identifiable barriers or psychosocial needs.      Screening Interventions   Interventions  Encouraged to exercise  Quality of Life Scores: Quality of Life - 07/05/18 1033      Quality of Life   Select  Quality of Life      Quality of Life Scores   Health/Function Pre  24.33 %    Socioeconomic Pre  28.5 %    Psych/Spiritual Pre  26.57 %    Family Pre  25.2 %    GLOBAL Pre  25.86 %      Scores of 19 and below usually indicate a poorer quality of life in these areas.  A difference of  2-3 points is a  clinically meaningful difference.  A difference of 2-3 points in the total score of the Quality of Life Index has been associated with significant improvement in overall quality of life, self-image, physical symptoms, and general health in studies assessing change in quality of life.  PHQ-9: Recent Review Flowsheet Data    Depression screen [facility] 2/9 07/13/2018   Decreased Interest 0   Down, Depressed, Hopeless 0   PHQ - 2 Score 0     Interpretation of Total Score  Total Score Depression Severity:  1-4 = Minimal depression, 5-9 = Mild depression, 10-14 = Moderate depression, 15-19 = Moderately severe depression, 20-27 = Severe depression   Psychosocial Evaluation and Intervention: Psychosocial Evaluation - 08/01/18 1726      Psychosocial Evaluation & Interventions   Interventions  Encouraged to exercise with the program and follow exercise prescription    Comments  No intervnetions neccessary.    Expected Outcomes  Donato will continue to have a positive outlook.    Continue Psychosocial Services   No Follow up required       Psychosocial Re-Evaluation: Psychosocial Re-Evaluation    [place] Name 08/01/18 1726 08/30/18 1543           Psychosocial Re-Evaluation   Current issues with  None Identified  None Identified      Comments  No intervention necessary.  No intervention necessary.      Expected Outcomes  Reyce will continue to have a positive outlook with good coping skills.  [place] will continue to have a positive outlook with good coping skills.      Interventions  Encouraged to attend Cardiac Rehabilitation for the exercise  Encouraged to attend Cardiac Rehabilitation for the exercise      Continue Psychosocial Services   No Follow up required  No Follow up required         Psychosocial Discharge (Final Psychosocial Re-Evaluation): Psychosocial Re-Evaluation - 08/30/18 1543      Psychosocial Re-Evaluation   Current issues with  None Identified    Comments  No  intervention necessary.    Expected Outcomes  [place] will continue to have a positive outlook with good coping skills.    Interventions  Encouraged to attend Cardiac Rehabilitation for the exercise    Continue Psychosocial Services   No Follow up required       Vocational Rehabilitation: Provide vocational rehab assistance to qualifying candidates.   Vocational Rehab Evaluation & Intervention: Vocational Rehab - 07/05/18 1034      Initial Vocational Rehab Evaluation & Intervention   Assessment shows need for Vocational Rehabilitation  No       Education: Education Goals: Education classes will be provided on a weekly basis, covering required topics. Participant will state understanding/return demonstration of topics presented.  Learning Barriers/Preferences: Learning Barriers/Preferences - 07/05/18 1145      Learning Barriers/Preferences   Learning Barriers  None  Learning Preferences  Written Material;Skilled Demonstration       Education Topics: Count Your Pulse:  -Group instruction provided by verbal instruction, demonstration, patient participation and written materials to support subject.  Instructors address importance of being able to find your pulse and how to count your pulse when at home without a heart monitor.  Patients get hands on experience counting their pulse with staff help and individually.   CARDIAC REHAB PHASE II EXERCISE from 08/29/2018 in [place]  Date  08/10/18  Instruction Review Code  3- Needs Reinforcement      Heart Attack, Angina, and Risk Factor Modification:  -Group instruction provided by verbal instruction, video, and written materials to support subject.  Instructors address signs and symptoms of angina and heart attacks.    Also discuss risk factors for heart disease and how to make changes to improve heart health risk factors.   CARDIAC REHAB PHASE II EXERCISE from 08/29/2018 in [place]  Date  08/29/18  Instruction Review Code  2- Demonstrated Understanding      Functional Fitness:  -Group instruction provided by verbal instruction, demonstration, patient participation, and written materials to support subject.  Instructors address safety measures for doing things around the house.  Discuss how to get up and down off the floor, how to pick things up properly, how to safely get out of a chair without assistance, and balance training.   CARDIAC REHAB PHASE II EXERCISE from 08/29/2018 in [place]  Date  08/17/18  Instruction Review Code  2- Demonstrated Understanding      Meditation and Mindfulness:  -Group instruction provided by verbal instruction, patient participation, and written materials to support subject.  Instructor addresses importance of mindfulness and meditation practice to help reduce stress and improve awareness.  Instructor also leads participants through a meditation exercise.    CARDIAC REHAB PHASE II EXERCISE from 08/29/2018 in [place]  Date  07/25/18  Educator  Jeanella Craze  Instruction Review Code  2- Demonstrated Understanding      Stretching for Flexibility and Mobility:  -Group instruction provided by verbal instruction, patient participation, and written materials to support subject.  Instructors lead participants through series of stretches that are designed to increase flexibility thus improving mobility.  These stretches are additional exercise for major muscle groups that are typically performed during regular warm up and cool down.   Hands Only CPR:  -Group verbal, video, and participation provides a basic overview of AHA guidelines for community CPR. Role-play of emergencies allow participants the opportunity to practice calling for help and chest compression technique with discussion of AED use.   Hypertension: -Group verbal and written instruction  that provides a basic overview of hypertension including the most recent diagnostic guidelines, risk factor reduction with self-care instructions and medication management.   CARDIAC REHAB PHASE II EXERCISE from 08/29/2018 in [place]  Date  07/20/18  Instruction Review Code  2- Demonstrated Understanding       Nutrition I class: Heart Healthy Eating:  -Group instruction provided by PowerPoint slides, verbal discussion, and written materials to support subject matter. The instructor gives an explanation and review of the Therapeutic Lifestyle Changes diet recommendations, which includes a discussion on lipid goals, dietary fat, sodium, fiber, plant stanol/sterol esters, sugar, and the components of a well-balanced, healthy diet.   Nutrition II class: Lifestyle Skills:  -Group instruction provided  by PowerPoint slides, verbal discussion, and written materials to support subject matter. The instructor gives an explanation and review of label reading, grocery shopping for heart health, heart healthy recipe modifications, and ways to make healthier choices when eating out.   Diabetes Question & Answer:  -Group instruction provided by PowerPoint slides, verbal discussion, and written materials to support subject matter. The instructor gives an explanation and review of diabetes co-morbidities, pre- and post-prandial blood glucose goals, pre-exercise blood glucose goals, signs, symptoms, and treatment of hypoglycemia and hyperglycemia, and foot care basics.   Diabetes Blitz:  -Group instruction provided by PowerPoint slides, verbal discussion, and written materials to support subject matter. The instructor gives an explanation and review of the physiology behind type 1 and type 2 diabetes, diabetes medications and rational behind using different medications, pre- and post-prandial blood glucose recommendations and Hemoglobin A1c goals, diabetes diet, and exercise  including blood glucose guidelines for exercising safely.    Portion Distortion:  -Group instruction provided by PowerPoint slides, verbal discussion, written materials, and food models to support subject matter. The instructor gives an explanation of serving size versus portion size, changes in portions sizes over the last 20 years, and what consists of a serving from each food group.   Stress Management:  -Group instruction provided by verbal instruction, video, and written materials to support subject matter.  Instructors review role of stress in heart disease and how to cope with stress positively.     CARDIAC REHAB PHASE II EXERCISE from 08/29/2018 in [place]  Date  08/15/18  Instruction Review Code  2- Demonstrated Understanding      Exercising on Your Own:  -Group instruction provided by verbal instruction, power point, and written materials to support subject.  Instructors discuss benefits of exercise, components of exercise, frequency and intensity of exercise, and end points for exercise.  Also discuss use of nitroglycerin and activating EMS.  Review options of places to exercise outside of rehab.  Review guidelines for sex with heart disease.   CARDIAC REHAB PHASE II EXERCISE from 08/29/2018 in [place]  Date  07/18/18  Educator  EP  Instruction Review Code  2- Demonstrated Understanding      Cardiac Drugs I:  -Group instruction provided by verbal instruction and written materials to support subject.  Instructor reviews cardiac drug classes: antiplatelets, anticoagulants, beta blockers, and statins.  Instructor discusses reasons, side effects, and lifestyle considerations for each drug class.   CARDIAC REHAB PHASE II EXERCISE from 08/29/2018 in [place]  Date  08/08/18  Instruction Review Code  2- Demonstrated Understanding      Cardiac Drugs II:  -Group instruction provided  by verbal instruction and written materials to support subject.  Instructor reviews cardiac drug classes: angiotensin converting enzyme inhibitors (ACE-I), angiotensin II receptor blockers (ARBs), nitrates, and calcium channel blockers.  Instructor discusses reasons, side effects, and lifestyle considerations for each drug class.   Anatomy and Physiology of the Circulatory System:  Group verbal and written instruction and models provide basic cardiac anatomy and physiology, with the coronary electrical and arterial systems. Review of: AMI, Angina, Valve disease, Heart Failure, Peripheral Artery Disease, Cardiac Arrhythmia, Pacemakers, and the ICD.   CARDIAC REHAB PHASE II EXERCISE from 08/29/2018 in [place]  Date  08/22/18  Instruction Review Code  2- Demonstrated Understanding      Other Education:  -Group or individual verbal, written, or  video instructions that support the educational goals of the cardiac rehab program.   Holiday Eating Survival Tips:  -Group instruction provided by PowerPoint slides, verbal discussion, and written materials to support subject matter. The instructor gives patients tips, tricks, and techniques to help them not only survive but enjoy the holidays despite the onslaught of food that accompanies the holidays.   Knowledge Questionnaire Score: Knowledge Questionnaire Score - 07/05/18 1034      Knowledge Questionnaire Score   Pre Score  23/24       Core Components/Risk Factors/Patient Goals at Admission: Personal Goals and Risk Factors at Admission - 08/02/18 0658      Core Components/Risk Factors/Patient Goals on Admission    Weight Management  Obesity;Yes    Intervention  Weight Management: Develop a combined nutrition and exercise program designed to reach desired caloric intake, while maintaining appropriate intake of nutrient and fiber, sodium and fats, and appropriate energy expenditure required for the weight  goal.;Weight Management/Obesity: Establish reasonable short term and long term weight goals.;Weight Management: Provide education and appropriate resources to help participant work on and attain dietary goals.;Obesity: Provide education and appropriate resources to help participant work on and attain dietary goals.    Admit Weight  238 lb 15.7 oz (108.4 kg)    Goal Weight: Long Term  228 lb (103.4 kg)    Expected Outcomes  Short Term: Continue to assess and modify interventions until short term weight is achieved    Hypertension  Yes    Intervention  Provide education on lifestyle modifcations including regular physical activity/exercise, weight management, moderate sodium restriction and increased consumption of fresh fruit, vegetables, and low fat dairy, alcohol moderation, and smoking cessation.;Monitor prescription use compliance.    Expected Outcomes  Short Term: Continued assessment and intervention until BP is < 140/26mm HG in hypertensive participants. < 130/59mm HG in hypertensive participants with diabetes, heart failure or chronic kidney disease.;Long Term: Maintenance of blood pressure at goal levels.    Lipids  Yes    Intervention  Provide education and support for participant on nutrition & aerobic/resistive exercise along with prescribed medications to achieve LDL 70mg , HDL >40mg .    Expected Outcomes  Short Term: Participant states understanding of desired cholesterol values and is compliant with medications prescribed. Participant is following exercise prescription and nutrition guidelines.;Long Term: Cholesterol controlled with medications as prescribed, with individualized exercise RX and with personalized nutrition plan. Value goals: LDL < 70mg , HDL > 40 mg.       Core Components/Risk Factors/Patient Goals Review:  Goals and Risk Factor Review    Row Name 08/02/18 0708 08/30/18 1543           Core Components/Risk Factors/Patient Goals Review   Personal Goals Review  Weight  Management/Obesity;Hypertension;Lipids  Weight Management/Obesity;Hypertension;Lipids      Review  Pt willing to participate in CR exercise.  Ragan would like to increase his strength and stamina.  He is tolerating exercise well so far.   Pt willing to participate in CR exercise.  Shrihaan is tolerating exercise well.  He feels that he is becoming more limber.       Expected Outcomes  Sephiroth will participate in CR exercise, nutrition, and lifestyle modification opportunities.   Davan will participate in CR exercise, nutrition, and lifestyle modification opportunities.          Core Components/Risk Factors/Patient Goals at Discharge (Final Review):  Goals and Risk Factor Review - 08/30/18 1543      Core Components/Risk Factors/Patient Goals  Review   Personal Goals Review  Weight Management/Obesity;Hypertension;Lipids    Review  Pt willing to participate in CR exercise.  Nicholos is tolerating exercise well.  He feels that he is becoming more limber.     Expected Outcomes  Ishaq will participate in CR exercise, nutrition, and lifestyle modification opportunities.        ITP Comments: ITP Comments    Row Name 07/05/18 1128 08/01/18 1725 08/30/18 1542       ITP Comments  Dr. Fransico Him, Medical Director   30 Day ITP Review.  Pt has tolerated starting exercise. His vital signs are stable.   30 Day ITP Review.  Pt has tolerated starting exercise. Anthonymichael feels that he is becoming more limber.         Comments: See ITP Comments.

## 2018-08-31 ENCOUNTER — Encounter (HOSPITAL_COMMUNITY): Payer: PPO

## 2018-08-31 ENCOUNTER — Encounter (HOSPITAL_COMMUNITY)
Admission: RE | Admit: 2018-08-31 | Discharge: 2018-08-31 | Disposition: A | Discharge status: Home / Self Care | Payer: PPO | Source: Ambulatory Visit | Attending: Cardiology | Admitting: Cardiology

## 2018-08-31 DIAGNOSIS — I5022 Chronic systolic (congestive) heart failure: Secondary | ICD-10-CM

## 2018-08-31 DIAGNOSIS — I110 Hypertensive heart disease with heart failure: Secondary | ICD-10-CM | POA: Diagnosis not present

## 2018-09-03 ENCOUNTER — Encounter (HOSPITAL_COMMUNITY)
Admission: RE | Admit: 2018-09-03 | Discharge: 2018-09-03 | Disposition: A | Discharge status: Home / Self Care | Payer: PPO | Source: Ambulatory Visit | Attending: Cardiology | Admitting: Cardiology

## 2018-09-03 ENCOUNTER — Encounter (HOSPITAL_COMMUNITY): Payer: PPO

## 2018-09-03 DIAGNOSIS — I110 Hypertensive heart disease with heart failure: Secondary | ICD-10-CM | POA: Diagnosis not present

## 2018-09-03 DIAGNOSIS — I5022 Chronic systolic (congestive) heart failure: Secondary | ICD-10-CM

## 2018-09-05 ENCOUNTER — Encounter (HOSPITAL_COMMUNITY): Payer: PPO

## 2018-09-05 ENCOUNTER — Encounter (HOSPITAL_COMMUNITY)
Admission: RE | Admit: 2018-09-05 | Discharge: 2018-09-05 | Disposition: A | Discharge status: Home / Self Care | Payer: PPO | Source: Ambulatory Visit | Attending: Cardiology | Admitting: Cardiology

## 2018-09-05 DIAGNOSIS — I110 Hypertensive heart disease with heart failure: Secondary | ICD-10-CM | POA: Diagnosis not present

## 2018-09-05 DIAGNOSIS — I5022 Chronic systolic (congestive) heart failure: Secondary | ICD-10-CM

## 2018-09-07 ENCOUNTER — Encounter (HOSPITAL_COMMUNITY)
Admission: RE | Admit: 2018-09-07 | Discharge: 2018-09-07 | Disposition: A | Discharge status: Home / Self Care | Payer: PPO | Source: Ambulatory Visit | Attending: Cardiology | Admitting: Cardiology

## 2018-09-07 ENCOUNTER — Encounter (HOSPITAL_COMMUNITY): Payer: PPO

## 2018-09-07 DIAGNOSIS — I5022 Chronic systolic (congestive) heart failure: Secondary | ICD-10-CM

## 2018-09-07 DIAGNOSIS — I110 Hypertensive heart disease with heart failure: Secondary | ICD-10-CM | POA: Diagnosis not present

## 2018-09-10 ENCOUNTER — Encounter (HOSPITAL_COMMUNITY): Payer: PPO

## 2018-09-10 ENCOUNTER — Encounter (HOSPITAL_COMMUNITY)
Admission: RE | Admit: 2018-09-10 | Discharge: 2018-09-10 | Disposition: A | Discharge status: Home / Self Care | Payer: PPO | Source: Ambulatory Visit | Attending: Cardiology | Admitting: Cardiology

## 2018-09-10 DIAGNOSIS — I5022 Chronic systolic (congestive) heart failure: Secondary | ICD-10-CM

## 2018-09-10 DIAGNOSIS — I110 Hypertensive heart disease with heart failure: Secondary | ICD-10-CM | POA: Diagnosis not present

## 2018-09-12 ENCOUNTER — Encounter (HOSPITAL_COMMUNITY): Payer: PPO

## 2018-09-12 ENCOUNTER — Encounter (HOSPITAL_COMMUNITY)
Admission: RE | Admit: 2018-09-12 | Discharge: 2018-09-12 | Disposition: A | Discharge status: Home / Self Care | Payer: PPO | Source: Ambulatory Visit | Attending: Cardiology | Admitting: Cardiology

## 2018-09-12 DIAGNOSIS — I110 Hypertensive heart disease with heart failure: Secondary | ICD-10-CM | POA: Diagnosis not present

## 2018-09-12 DIAGNOSIS — I5022 Chronic systolic (congestive) heart failure: Secondary | ICD-10-CM

## 2018-09-14 ENCOUNTER — Encounter (HOSPITAL_COMMUNITY): Payer: PPO

## 2018-09-14 ENCOUNTER — Encounter (HOSPITAL_COMMUNITY)
Admission: RE | Admit: 2018-09-14 | Discharge: 2018-09-14 | Disposition: A | Discharge status: Home / Self Care | Payer: PPO | Source: Ambulatory Visit | Attending: Cardiology | Admitting: Cardiology

## 2018-09-14 DIAGNOSIS — I5022 Chronic systolic (congestive) heart failure: Secondary | ICD-10-CM

## 2018-09-14 DIAGNOSIS — I110 Hypertensive heart disease with heart failure: Secondary | ICD-10-CM | POA: Diagnosis not present

## 2018-09-17 ENCOUNTER — Encounter (HOSPITAL_COMMUNITY)
Admission: RE | Admit: 2018-09-17 | Discharge: 2018-09-17 | Disposition: A | Discharge status: Home / Self Care | Payer: PPO | Source: Ambulatory Visit | Attending: Cardiology | Admitting: Cardiology

## 2018-09-17 ENCOUNTER — Encounter (HOSPITAL_COMMUNITY): Payer: PPO

## 2018-09-17 DIAGNOSIS — I5022 Chronic systolic (congestive) heart failure: Secondary | ICD-10-CM

## 2018-09-17 DIAGNOSIS — I110 Hypertensive heart disease with heart failure: Secondary | ICD-10-CM | POA: Diagnosis not present

## 2018-09-19 ENCOUNTER — Encounter (HOSPITAL_COMMUNITY): Payer: PPO

## 2018-09-19 ENCOUNTER — Encounter (HOSPITAL_COMMUNITY)
Admission: RE | Admit: 2018-09-19 | Discharge: 2018-09-19 | Disposition: A | Discharge status: Home / Self Care | Payer: PPO | Source: Ambulatory Visit | Attending: Cardiology | Admitting: Cardiology

## 2018-09-19 DIAGNOSIS — I110 Hypertensive heart disease with heart failure: Secondary | ICD-10-CM | POA: Diagnosis not present

## 2018-09-19 DIAGNOSIS — I5022 Chronic systolic (congestive) heart failure: Secondary | ICD-10-CM

## 2018-09-21 ENCOUNTER — Encounter (HOSPITAL_COMMUNITY): Payer: PPO

## 2018-09-21 ENCOUNTER — Encounter (HOSPITAL_COMMUNITY)
Admission: RE | Admit: 2018-09-21 | Discharge: 2018-09-21 | Disposition: A | Discharge status: Home / Self Care | Payer: PPO | Source: Ambulatory Visit | Attending: Cardiology | Admitting: Cardiology

## 2018-09-21 VITALS — Ht 68.5 in | Wt 243.4 lb

## 2018-09-21 DIAGNOSIS — I5022 Chronic systolic (congestive) heart failure: Secondary | ICD-10-CM

## 2018-09-21 DIAGNOSIS — I110 Hypertensive heart disease with heart failure: Secondary | ICD-10-CM | POA: Diagnosis not present

## 2018-09-24 ENCOUNTER — Encounter (HOSPITAL_COMMUNITY): Payer: PPO

## 2018-09-24 ENCOUNTER — Encounter (HOSPITAL_COMMUNITY)
Admission: RE | Admit: 2018-09-24 | Discharge: 2018-09-24 | Disposition: A | Discharge status: Home / Self Care | Payer: PPO | Source: Ambulatory Visit | Attending: Cardiology | Admitting: Cardiology

## 2018-09-24 DIAGNOSIS — I110 Hypertensive heart disease with heart failure: Secondary | ICD-10-CM | POA: Diagnosis not present

## 2018-09-24 DIAGNOSIS — I5022 Chronic systolic (congestive) heart failure: Secondary | ICD-10-CM

## 2018-09-25 ENCOUNTER — Encounter (HOSPITAL_COMMUNITY): Payer: Self-pay | Admitting: Cardiology

## 2018-09-25 ENCOUNTER — Ambulatory Visit (HOSPITAL_COMMUNITY)
Admission: RE | Admit: 2018-09-25 | Discharge: 2018-09-25 | Disposition: A | Discharge status: Home / Self Care | Payer: PPO | Source: Ambulatory Visit | Attending: Cardiology | Admitting: Cardiology

## 2018-09-25 VITALS — BP 132/74 | HR 64 | Wt 245.0 lb

## 2018-09-25 DIAGNOSIS — I5022 Chronic systolic (congestive) heart failure: Secondary | ICD-10-CM | POA: Diagnosis not present

## 2018-09-25 DIAGNOSIS — I428 Other cardiomyopathies: Secondary | ICD-10-CM | POA: Diagnosis not present

## 2018-09-25 DIAGNOSIS — G629 Polyneuropathy, unspecified: Secondary | ICD-10-CM | POA: Insufficient documentation

## 2018-09-25 DIAGNOSIS — Z7982 Long term (current) use of aspirin: Secondary | ICD-10-CM | POA: Insufficient documentation

## 2018-09-25 DIAGNOSIS — Z8249 Family history of ischemic heart disease and other diseases of the circulatory system: Secondary | ICD-10-CM | POA: Insufficient documentation

## 2018-09-25 DIAGNOSIS — Z87891 Personal history of nicotine dependence: Secondary | ICD-10-CM | POA: Diagnosis not present

## 2018-09-25 DIAGNOSIS — I10 Essential (primary) hypertension: Secondary | ICD-10-CM | POA: Insufficient documentation

## 2018-09-25 DIAGNOSIS — I420 Dilated cardiomyopathy: Secondary | ICD-10-CM | POA: Diagnosis not present

## 2018-09-25 DIAGNOSIS — Z79899 Other long term (current) drug therapy: Secondary | ICD-10-CM | POA: Insufficient documentation

## 2018-09-25 DIAGNOSIS — E785 Hyperlipidemia, unspecified: Secondary | ICD-10-CM | POA: Diagnosis not present

## 2018-09-25 LAB — BASIC METABOLIC PANEL
Anion gap: 6 (ref 5–15)
BUN: 17 mg/dL (ref 8–23)
CHLORIDE: 110 mmol/L (ref 98–111)
CO2: 25 mmol/L (ref 22–32)
CREATININE: 0.99 mg/dL (ref 0.61–1.24)
Calcium: 9.3 mg/dL (ref 8.9–10.3)
GFR calc non Af Amer: >60 mL/min (ref >60)
Glucose, Bld: 99 mg/dL (ref 70–99)
POTASSIUM: 3.9 mmol/L (ref 3.5–5.1)
SODIUM: 141 mmol/L (ref 135–145)

## 2018-09-25 MED ORDER — SACUBITRIL-VALSARTAN 97-103 MG PO TABS: 1.0000 | ORAL_TABLET | Freq: Two times a day (BID) | ORAL | 6 refills | Status: DC

## 2018-09-25 NOTE — Patient Instructions (Signed)
Increase Entresto to 97/103 mg Twice daily   Labs today  Labs in 2 weeks  Your physician recommends that you schedule a follow-up appointment in: February with echocardiogram

## 2018-09-26 ENCOUNTER — Encounter (HOSPITAL_COMMUNITY)
Admission: RE | Admit: 2018-09-26 | Discharge: 2018-09-26 | Disposition: A | Discharge status: Home / Self Care | Payer: PPO | Source: Ambulatory Visit | Attending: Cardiology | Admitting: Cardiology

## 2018-09-26 ENCOUNTER — Encounter (HOSPITAL_COMMUNITY): Payer: PPO

## 2018-09-26 DIAGNOSIS — I5022 Chronic systolic (congestive) heart failure: Secondary | ICD-10-CM

## 2018-09-26 DIAGNOSIS — I110 Hypertensive heart disease with heart failure: Secondary | ICD-10-CM | POA: Diagnosis not present

## 2018-09-26 NOTE — Progress Notes (Signed)
Patient ID: Kevin Murillo, male   DOB: 1946-06-23, 72 y.o.   MRN: [id number] PCP: Dr. Virgina Jock Cardiology: Dr. Aundra Dubin  72 y.o.with history of HTN and nonischemic cardiomyopathy (diagnosed in 2011) presents for cardiology followup.  He had an echo in 1/15, showing that EF remains 25-30% with mild LV dilation.  I had him do a cardiopulmonary exercise test in 3/15 that showed normal functional capacity.  Most recent echo in 4/19 showed EF remains 25-30%.  Repeat LHC/RHC in 4/19 showed no significant coronary disease, mildly elevated LV filling pressure, and CI 2.01.   He return for followup of CHF.  He is doing well.  He has started cardiac rehab and feels like the program is helping him.  No exertional dyspnea doing his exercises or with normal ambulation and ADLs. No chest pain.  No lightheadedness.  No orthopnea/PND. Weight is down 1 lb.   Labs (6/11): K 3.9, TSH normal, creatinine 1.0  Labs (7/11): HDL 60, LDL 122, K 4.4, creatinine 1.0, BNP 86, SPEP normal, transferrin saturation 24%  Labs (9/11): LDL 67, HDL 48, TGs 145, LFTs normal  Labs (10/11): K 4, creatinine 1.2  Labs (5/13): K 3.9, creatinine 1.0, LDL 66, HDL 44 Labs (7/14): K 3.9, creatinine 1.1 Labs (3/15): K 4.1, creatinine 1.1 Labs (8/15): K 4, creatinine 1.1, BNP 21 Labs (4/19): K 4.4, creatinine 1.06 Labs (5/19): K 4.4, creatinine 1.13 Labs (8/19): K 4.2, creatinine 1.29  Allergies (verified):  No Known Drug Allergies   Past Medical History:  1. HTN  2. Left hand neuropathy  3. Nonischemic cardiomyopathy: Echo (6/11) with EF 30-35%, posteroinferior severe hypokinesis, moderate diastolic dysfunction, mild left atrial enlargement, mild LV hypertrophy. LHC (7/11): EF 30-35% with diffuse hypokinesis, no angiographic CAD. SPEP and Fe studies unremarkable, TSH normal.  Repeat echo (1/12) with EF 45%, mild global hypokinesis, mild LVH, normal RV size and systolic function.  Echo (6/14) with EF 20-25%, diffuse hypokinesis, mild LV  dilation. Echo (1/15) with mild LV dilation, EF 25-30%, diffuse hypokinesis, prominent apical trabeculation, mildly dilated RV. CPX (3/15) with VO2 max 20.1, VE/VCO2 slope 34.7 (near normal when adjusted to respiratory compensation point) => this CPX showed normal functional capacity for his age.  - Echo (4/19): EF 25-30%, mild MR.  - RHC/LHC (4/19): No significant CAD.  Mean RA 6, PA 46/18 mean 32, mean PCWP 21, CI 2.01, PVR 2.4 WU (pulmonary venous hypertension).  4. Hyperlipidemia   Family History:  Mother died with CHF at 53.   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  Quit smoking in 1996.  Occasional ETOH  Married with 3 children. Wife is now in dementia unit at [facility].    ROS: All systems reviewed and negative except as per HPI.   Current Outpatient Medications  Medication Sig Dispense Refill  . aspirin 81 MG tablet Take 81 mg by mouth daily.    . carvedilol (COREG) 25 MG tablet Take 1 tablet (25 mg total) by mouth 2 (two) times daily. 60 tablet 11  . simvastatin (ZOCOR) 40 MG tablet Take 40 mg by mouth daily at 6 PM.     . spironolactone (ALDACTONE) 25 MG tablet TAKE 1 TABLET BY MOUTH EVERY DAY 30 tablet 6  . sacubitril-valsartan (ENTRESTO) 97-103 MG Take 1 tablet by mouth 2 (two) times daily. 60 tablet 6   No current facility-administered medications for this encounter.  BP 132/74   Pulse 64   Wt 111.1 kg (245 lb)   SpO2 98%   BMI 36.71 kg/m  General: NAD Neck: No JVD, no thyromegaly or thyroid nodule.  Lungs: Clear to auscultation bilaterally with normal respiratory effort. CV: Nondisplaced PMI.  Heart regular S1/S2, no S3/S4, no murmur.  No peripheral edema.  No carotid bruit.  Normal pedal pulses.  Abdomen: Soft, nontender, no hepatosplenomegaly, no distention.  Skin: Intact without lesions or rashes.  Neurologic: Alert and oriented x 3.  Psych:  Normal affect. Extremities: No clubbing or cyanosis.  HEENT: Normal.   Assessment/Plan: 1. Nonischemic cardiomyopathy: Possibly due to viral myocarditis versus HTN.  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 Last echo in 4/19 showed EF 25-30%, stable compared to the past.  No CAD on 4/19 cath, but CI marginal at 2.01 with mildly elevated PCWP. NYHA class II symptoms.  Not volume overloaded on exam.   - He did not get an ICD in the past with minimal symptoms and nonischemic etiology. Will plan medical titration and will reassess need for ICD with future echo.  Would probably aim to avoid given more advanced age and no CAD unless he is proven to have ventricular arrhythmias.  Will plan echo in 2/20 at time of followup.  - Continue Coreg 25 mg bid.  - Continue spironolactone 25 daily.  - Increase Entresto to 97/103 bid with BMET today and in 10 days.  - Continue cardiac rehab.  2. HTN: BP controlled.   Followup + echo in 2/20.   Loralie Champagne 09/26/2018

## 2018-09-27 ENCOUNTER — Encounter (HOSPITAL_COMMUNITY): Payer: Self-pay

## 2018-09-27 NOTE — Progress Notes (Signed)
Cardiac Individual Treatment Plan  Patient Details  Name: Kevin Murillo MRN: [id number] Date of Birth: [birth year] Referring Provider:     CARDIAC REHAB PHASE II ORIENTATION from 07/05/2018 in [place]  Referring Provider  Larey Dresser MD       Initial Encounter Date:    CARDIAC REHAB PHASE II ORIENTATION from 07/05/2018 in [place]  Date  07/05/18      Visit Diagnosis: Heart failure, chronic systolic ([place])  Patient's Home Medications on Admission:  Current Outpatient Medications:  .  aspirin 81 MG tablet, Take 81 mg by mouth daily., Disp: , Rfl:  .  carvedilol (COREG) 25 MG tablet, Take 1 tablet (25 mg total) by mouth 2 (two) times daily., Disp: 60 tablet, Rfl: 11 .  sacubitril-valsartan (ENTRESTO) 97-103 MG, Take 1 tablet by mouth 2 (two) times daily., Disp: 60 tablet, Rfl: 6 .  simvastatin (ZOCOR) 40 MG tablet, Take 40 mg by mouth daily at 6 PM. , Disp: , Rfl:  .  spironolactone (ALDACTONE) 25 MG tablet, TAKE 1 TABLET BY MOUTH EVERY DAY, Disp: 30 tablet, Rfl: 6  Past Medical History: Past Medical History:  Diagnosis Date  . Chronic systolic CHF (congestive heart failure), NYHA class 1 ([place])   . History of echocardiogram    Echo 3/16: Mild LVH, EF 40-45%, anteroseptal akinesis, grade 1 diastolic dysfunction, moderate LAE // Echo 3/19: diff HK worse in basal and mid inf wall, mild LVH, EF 25-30, mild MR, mild LAE  . Hyperlipidemia   . Hypertension   . Neuropathy of hand   . Non-ischemic cardiomyopathy ([place])    a. Echo 6/11: 30-35% // b. LHC 7/11: EF 30-35% with diffuse hypokinesis, no angiographic CAD. SPEP and Fe studies unremarkable, TSH normal.//  c. Echo 1/12: EF 45% // d. Echo 6/14: EF 20-25%  //  e. Echo EF 25-30%, diffuse hypokinesis, prominent apical trabeculation // f. CPX 3/15: normal fxnal capacity  // g. CPX 3/16 low norma fxnal capacity  // h. Echo 3/16: mild LVH, EF 40-45%   . Obesity   .  PVC (premature ventricular contraction)   . Rosacea   . Tubular adenoma of colon 12/2010    Tobacco Use: Social History   Tobacco Use  Smoking Status Former Smoker  . Types: Cigarettes  . Last attempt to quit: 03/12/2002  . Years since quitting: 16.5  Smokeless Tobacco Never Used    Labs: Recent Review Flowsheet Data    Labs for ITP Cardiac and Pulmonary Rehab Latest Ref Rng & Units 05/28/2010 07/28/2010 08/03/2010 03/09/2018 03/09/2018   Cholestrol 0 - 200 mg/dL 219(H) 151 144 - -   LDLCALC 0 - 99 mg/dL - 62 67 - -   LDLDIRECT mg/dL 121.7 - - - -   HDL >39.00 mg/dL 59.50 52.20 48.00 - -   Trlycerides 0.0 - 149.0 mg/dL 187.0(H) 186.0(H) 145.0 - -   HCO3 20.0 - 28.0 mmol/L - - - 26.2 25.4   TCO2 22 - 32 mmol/L - - - 28 27   ACIDBASEDEF 0.0 - 2.0 mmol/L - - - - 1.0   O2SAT % - - - 60.0 62.0      Capillary Blood Glucose: No results found for: GLUCAP   Exercise Target Goals: Exercise Program Goal: Individual exercise prescription set using results from initial 6 min walk test and THRR while considering  patient's activity barriers and safety.   Exercise Prescription Goal: Initial exercise prescription builds  to 30-45 minutes a day of aerobic activity, 2-3 days per week.  Home exercise guidelines will be given to patient during program as part of exercise prescription that the participant will acknowledge.  Activity Barriers & Risk Stratification: Activity Barriers & Cardiac Risk Stratification - 07/05/18 1132      Activity Barriers & Cardiac Risk Stratification   Activity Barriers  Arthritis;Other (comment)    Comments  Occasional Knee and Hip Pain    Cardiac Risk Stratification  High       6 Minute Walk: 6 Minute Walk    Row Name 07/05/18 1129 09/21/18 1401       6 Minute Walk   Phase  Initial  Initial    Distance  900 feet  1400 feet    Distance % Change  -  55.56 %    Distance Feet Change  -  500 ft    Walk Time  6 minutes  6 minutes    # of Rest Breaks  0  0     MPH  1.7  2.65    METS  1.5  2.64    RPE  11  13    Perceived Dyspnea   0  0    VO2 Peak  5.25  9.25    Symptoms  No  No    Resting HR  75 bpm  74 bpm    Resting BP  104/70  106/70    Resting Oxygen Saturation   98 %  -    Exercise Oxygen Saturation  during 6 min walk  96 %  -    Max Ex. HR  104 bpm  116 bpm    Max Ex. BP  118/76  142/84    2 Minute Post BP  118/72  110/70       Oxygen Initial Assessment:   Oxygen Re-Evaluation:   Oxygen Discharge (Final Oxygen Re-Evaluation):   Initial Exercise Prescription: Initial Exercise Prescription - 07/05/18 1100      Date of Initial Exercise RX and Referring Provider   Date  07/05/18    Referring Provider  Larey Dresser MD     Expected Discharge Date  09/28/18      Recumbant Bike   Level  1.5    Watts  10    Minutes  10    METs  2.3      NuStep   Level  2    SPM  75    Minutes  10    METs  1.5      Track   Laps  7    Minutes  10    METs  2.23      Prescription Details   Frequency (times per week)  3x    Duration  Progress to 30 minutes of continuous aerobic without signs/symptoms of physical distress      Intensity   THRR 40-80% of Max Heartrate  59-118    Ratings of Perceived Exertion  11-13    Perceived Dyspnea  0-4      Progression   Progression  Continue progressive overload as per policy without signs/symptoms or physical distress.      Resistance Training   Training Prescription  Yes    Weight  4lbs    Reps  10-15       Perform Capillary Blood Glucose checks as needed.  Exercise Prescription Changes: Exercise Prescription Changes    Row Name 07/13/18 1500 07/18/18 1638 07/27/18 1413 08/15/18 1500  08/29/18 0800     Response to Exercise   Blood Pressure (Admit)  108/78  134/80  120/72  124/78  110/70   Blood Pressure (Exercise)  122/80  128/84  130/80  132/78  128/82   Blood Pressure (Exit)  118/78  110/70  120/66  122/78  118/70   Heart Rate (Admit)  77 bpm  74 bpm  82 bpm  77 bpm  75 bpm    Heart Rate (Exercise)  89 bpm  107 bpm  106 bpm  104 bpm  105 bpm   Heart Rate (Exit)  62 bpm  67 bpm  71 bpm  65 bpm  71 bpm   Rating of Perceived Exertion (Exercise)  11  12  12  12  12    Perceived Dyspnea (Exercise)  0  0  0  0  0   Symptoms  None   None   None   None   None    Comments  Pt oriented to exercise equipment   None   -  None  None   Duration  Continue with 30 min of aerobic exercise without signs/symptoms of physical distress.  Continue with 30 min of aerobic exercise without signs/symptoms of physical distress.  Continue with 30 min of aerobic exercise without signs/symptoms of physical distress.  Continue with 30 min of aerobic exercise without signs/symptoms of physical distress.  Continue with 30 min of aerobic exercise without signs/symptoms of physical distress.   Intensity  THRR unchanged  THRR unchanged  THRR unchanged  THRR unchanged  THRR unchanged     Progression   Progression  Continue to progress workloads to maintain intensity without signs/symptoms of physical distress.  Continue to progress workloads to maintain intensity without signs/symptoms of physical distress.  Continue to progress workloads to maintain intensity without signs/symptoms of physical distress.  Continue to progress workloads to maintain intensity without signs/symptoms of physical distress.  Continue to progress workloads to maintain intensity without signs/symptoms of physical distress.   Average METs  2.06  2.44  2.42  2.43  2.66     Resistance Training   Training Prescription  Yes  No  Yes  No  No   Weight  2lbs  -  4lbs  -  -   Reps  10-15  -  10-15  -  -   Time  10 Minutes  -  10 Minutes  -  -     Interval Training   Interval Training  No  No  No  -  -     NuStep   Level  2  2  3  5  5    SPM  75  85  90  95  100   Minutes  15  10  10  10  10    METs  1.9  2.7  2.5  2.9  3.4     Arm Ergometer   Level  -  1  2  3  4    Watts  -  25  25  30  30    Minutes  -  10  10  10  10    METs   -  2.5  2  2.3  2.3     Track   Laps  7  10  7  7  8    Minutes  10  10  10  10  10    METs  2.23  2.76  2.23  2.23  2.38  Home Exercise Plan   Plans to continue exercise at  -  -  Home (comment) Walking  Home (comment) Walking  Home (comment) Walking   Frequency  -  -  Add 2 additional days to program exercise sessions.  Add 2 additional days to program exercise sessions.  Add 2 additional days to program exercise sessions.   Initial Home Exercises Provided  -  -  07/25/18  07/25/18  07/25/18   Row Name 09/10/18 1042 09/24/18 1423           Response to Exercise   Blood Pressure (Admit)  124/70  108/64      Blood Pressure (Exercise)  130/80  150/70      Blood Pressure (Exit)  108/70  118/74      Heart Rate (Admit)  77 bpm  76 bpm      Heart Rate (Exercise)  104 bpm  107 bpm      Heart Rate (Exit)  77 bpm  76 bpm      Rating of Perceived Exertion (Exercise)  12  12      Perceived Dyspnea (Exercise)  0  0      Symptoms  None   None       Comments  None  None      Duration  Continue with 30 min of aerobic exercise without signs/symptoms of physical distress.  Continue with 30 min of aerobic exercise without signs/symptoms of physical distress.      Intensity  THRR unchanged  THRR unchanged        Progression   Progression  Continue to progress workloads to maintain intensity without signs/symptoms of physical distress.  Continue to progress workloads to maintain intensity without signs/symptoms of physical distress.      Average METs  2.52  2.55        Resistance Training   Training Prescription  Yes  Yes      Weight  4lbs  4lbs      Reps  10-15  10-15      Time  10 Minutes  10 Minutes        Interval Training   Interval Training  No  No        NuStep   Level  5  5      SPM  100  100      Minutes  10  10      METs  2.8  2.9        Arm Ergometer   Level  4  4      Watts  30  30      Minutes  10  10      METs  2.3  2.3        Track   Laps  9  10      Minutes  10   10      METs  2.53  2.76        Home Exercise Plan   Plans to continue exercise at  Home (comment) Walking  Home (comment) Walking      Frequency  Add 2 additional days to program exercise sessions.  Add 2 additional days to program exercise sessions.      Initial Home Exercises Provided  07/25/18  07/25/18         Exercise Comments: Exercise Comments    Row Name 07/13/18 1502 07/25/18 1401 08/30/18 0756 09/27/18 1427     Exercise Comments  Pt's  first day of exercise. Pt responeded well to exercise workloads. Will continue to monitor and progress pt as tolerated.   Reviewed HEP with pt. Pt is not exercising at home. Pt plans to start walking for exercise. Will continue to follow up with pt regarding exercising at home.   Reviewed METs and Goals with pt. Pt is continuing to respond well to exercise workloads. Pt is enjoying exercising in rehab. Will continue to monitor and progress pt.   Reviewed METs and Goals with pt. Pt is making great strides while in rehab and is continously putting forth great effort. Will continue to monitor.        Exercise Goals and Review: Exercise Goals    Row Name 07/05/18 1133             Exercise Goals   Increase Physical Activity  Yes       Intervention  Develop an individualized exercise prescription for aerobic and resistive training based on initial evaluation findings, risk stratification, comorbidities and participant's personal goals.;Provide advice, education, support and counseling about physical activity/exercise needs.       Expected Outcomes  Short Term: Attend rehab on a regular basis to increase amount of physical activity.;Long Term: Add in home exercise to make exercise part of routine and to increase amount of physical activity.;Long Term: Exercising regularly at least 3-5 days a week.       Increase Strength and Stamina  Yes       Intervention  Provide advice, education, support and counseling about physical activity/exercise  needs.;Develop an individualized exercise prescription for aerobic and resistive training based on initial evaluation findings, risk stratification, comorbidities and participant's personal goals.       Expected Outcomes  Short Term: Increase workloads from initial exercise prescription for resistance, speed, and METs.;Short Term: Perform resistance training exercises routinely during rehab and add in resistance training at home;Long Term: Improve cardiorespiratory fitness, muscular endurance and strength as measured by increased METs and functional capacity (6MWT)       Able to understand and use rate of perceived exertion (RPE) scale  Yes       Intervention  Provide education and explanation on how to use RPE scale       Expected Outcomes  Short Term: Able to use RPE daily in rehab to express subjective intensity level;Long Term:  Able to use RPE to guide intensity level when exercising independently       Knowledge and understanding of Target Heart Rate Range (THRR)  Yes       Intervention  Provide education and explanation of THRR including how the numbers were predicted and where they are located for reference       Expected Outcomes  Short Term: Able to state/look up THRR;Short Term: Able to use daily as guideline for intensity in rehab;Long Term: Able to use THRR to govern intensity when exercising independently       Able to check pulse independently  Yes       Intervention  Provide education and demonstration on how to check pulse in carotid and radial arteries.;Review the importance of being able to check your own pulse for safety during independent exercise       Expected Outcomes  Short Term: Able to explain why pulse checking is important during independent exercise;Long Term: Able to check pulse independently and accurately       Understanding of Exercise Prescription  Yes       Intervention  Provide education, explanation,  and written materials on patient's individual exercise prescription        Expected Outcomes  Short Term: Able to explain program exercise prescription;Long Term: Able to explain home exercise prescription to exercise independently          Exercise Goals Re-Evaluation : Exercise Goals Re-Evaluation    Row Name 07/25/18 1402 08/30/18 0756 09/27/18 1432         Exercise Goal Re-Evaluation   Exercise Goals Review  Increase Physical Activity;Able to understand and use rate of perceived exertion (RPE) scale;Knowledge and understanding of Target Heart Rate Range (THRR);Understanding of Exercise Prescription;Increase Strength and Stamina;Able to check pulse independently  Increase Physical Activity;Understanding of Exercise Prescription  Understanding of Exercise Prescription     Comments  Reviewed HEP with pt. Also reviewed THRR, RPE Scale, weather precautions, endpoints of exercise, NTG use, warmup and cool down.  Pt is responding well workload increases. Pt is now exercising at a level 4 on Arm Crank and level 5 on Nustep. Pt shows motivation to put forth effort while in rehab. Will continue to work with pt to be motivated to exercise on his own.   Pt is continuing to make great progress while in rehab. Pt is now able to walk 10 laps in 10 minutes with no issues. One of pt's goals was to increase his strength and stamina. Pt has reached that goal and is continuing to increase his cardiorespiratory fitness.      Expected Outcomes  Pt plans to walk 2 days a week for 10-15 minutes. Pt will gradually work to increase walking time. Pt will continue to increase stamina. Will continue to monitor pt.  Pt will continue to walk for exercise 2 days a week 15 minutes. Pt will continue to increase cardiorespiratory fitness. Will continue to monitor.   Pt will continue to walk 2-3 days for 15 minutes. Pt has 3 more rehab sessions left. Pt will plan to sign up for [facility]ne[facility] at local [facility] before completing rehab.        Discharge Exercise Prescription (Final Exercise Prescription  Changes): Exercise Prescription Changes - 09/24/18 1423      Response to Exercise   Blood Pressure (Admit)  108/64    Blood Pressure (Exercise)  150/70    Blood Pressure (Exit)  118/74    Heart Rate (Admit)  76 bpm    Heart Rate (Exercise)  107 bpm    Heart Rate (Exit)  76 bpm    Rating of Perceived Exertion (Exercise)  12    Perceived Dyspnea (Exercise)  0    Symptoms  None     Comments  None    Duration  Continue with 30 min of aerobic exercise without signs/symptoms of physical distress.    Intensity  THRR unchanged      Progression   Progression  Continue to progress workloads to maintain intensity without signs/symptoms of physical distress.    Average METs  2.55      Resistance Training   Training Prescription  Yes    Weight  4lbs    Reps  10-15    Time  10 Minutes      Interval Training   Interval Training  No      NuStep   Level  5    SPM  100    Minutes  10    METs  2.9      Arm Ergometer   Level  4    Watts  30  Minutes  10    METs  2.3      Track   Laps  10    Minutes  10    METs  2.76      Home Exercise Plan   Plans to continue exercise at  Home (comment)   Walking   Frequency  Add 2 additional days to program exercise sessions.    Initial Home Exercises Provided  07/25/18       Nutrition:  Target Goals: Understanding of nutrition guidelines, daily intake of sodium 1500mg , cholesterol 200mg , calories 30% from fat and 7% or less from saturated fats, daily to have 5 or more servings of fruits and vegetables.  Biometrics: Pre Biometrics - 07/05/18 1133      Pre Biometrics   Height  5' 8.5" (1.74 m)    Weight  108.4 kg    Waist Circumference  49 inches    Hip Circumference  47 inches    Waist to Hip Ratio  1.04 %    BMI (Calculated)  35.8    Triceps Skinfold  25 mm    % Body Fat  37.1 %    Grip Strength  38 kg    Flexibility  9.5 in    Single Leg Stand  4.87 seconds      Post Biometrics - 09/21/18 1403       Post  Biometrics    Height  5' 8.5" (1.74 m)    Weight  110.4 kg    Waist Circumference  49 inches    Hip Circumference  46 inches    Waist to Hip Ratio  1.07 %    BMI (Calculated)  36.46    Triceps Skinfold  26 mm    % Body Fat  37.5 %    Grip Strength  27 kg    Flexibility  10.5 in    Single Leg Stand  3.53 seconds       Nutrition Therapy Plan and Nutrition Goals: Nutrition Therapy & Goals - 07/05/18 0901      Nutrition Therapy   Diet  heart healthy      Personal Nutrition Goals   Nutrition Goal  Pt to identify and limit food sources of saturated fat, trans fat, and sodium    Personal Goal #2  Pt to identify food quantities necessary to achieve weight loss of 6-24 lbs. at graduation from cardiac rehab    Personal Goal #3  Pt to decrease number of meals eaten away from home      [place], educate and counsel regarding individualized specific dietary modifications aiming towards targeted core components such as weight, hypertension, lipid management, diabetes, heart failure and other comorbidities.    Expected Outcomes  Short Term Goal: Understand basic principles of dietary content, such as calories, fat, sodium, cholesterol and nutrients.       Nutrition Assessments: Nutrition Assessments - 07/05/18 0907      MEDFICTS Scores   Pre Score  --   did not complete      Nutrition Goals Re-Evaluation: Nutrition Goals Re-Evaluation    [place] Name 07/05/18 0901             Goals   Current Weight  238 lb 15.7 oz (108.4 kg)          Nutrition Goals Re-Evaluation: Nutrition Goals Re-Evaluation    [place] Name 07/05/18 0901             Goals  Current Weight  238 lb 15.7 oz (108.4 kg)          Nutrition Goals Discharge (Final Nutrition Goals Re-Evaluation): Nutrition Goals Re-Evaluation - 07/05/18 0901      Goals   Current Weight  238 lb 15.7 oz (108.4 kg)       Psychosocial: Target Goals: Acknowledge presence or absence of significant  depression and/or stress, maximize coping skills, provide positive support system. Participant is able to verbalize types and ability to use techniques and skills needed for reducing stress and depression.  Initial Review & Psychosocial Screening: Initial Psych Review & Screening - 07/05/18 1033      Initial Review   Current issues with  None Identified      Family Dynamics   Good Support System?  Yes   Bria reports his family as a source of support for him.      Barriers   Psychosocial barriers to participate in program  There are no identifiable barriers or psychosocial needs.      Screening Interventions   Interventions  Encouraged to exercise       Quality of Life Scores: Quality of Life - 07/05/18 1033      Quality of Life   Select  Quality of Life      Quality of Life Scores   Health/Function Pre  24.33 %    Socioeconomic Pre  28.5 %    Psych/Spiritual Pre  26.57 %    Family Pre  25.2 %    GLOBAL Pre  25.86 %      Scores of 19 and below usually indicate a poorer quality of life in these areas.  A difference of  2-3 points is a clinically meaningful difference.  A difference of 2-3 points in the total score of the Quality of Life Index has been associated with significant improvement in overall quality of life, self-image, physical symptoms, and general health in studies assessing change in quality of life.  PHQ-9: Recent Review Flowsheet Data    Depression screen [facility] 2/9 07/13/2018   Decreased Interest 0   Down, Depressed, Hopeless 0   PHQ - 2 Score 0     Interpretation of Total Score  Total Score Depression Severity:  1-4 = Minimal depression, 5-9 = Mild depression, 10-14 = Moderate depression, 15-19 = Moderately severe depression, 20-27 = Severe depression   Psychosocial Evaluation and Intervention: Psychosocial Evaluation - 08/01/18 1726      Psychosocial Evaluation & Interventions   Interventions  Encouraged to exercise with the program and follow  exercise prescription    Comments  No intervnetions neccessary.    Expected Outcomes  Jakyri will continue to have a positive outlook.    Continue Psychosocial Services   No Follow up required       Psychosocial Re-Evaluation: Psychosocial Re-Evaluation    [place] Name 08/01/18 1726 08/30/18 1543 09/27/18 1056         Psychosocial Re-Evaluation   Current issues with  None Identified  None Identified  None Identified     Comments  No intervention necessary.  No intervention necessary.  No intervention necessary.     Expected Outcomes  Jamal will continue to have a positive outlook with good coping skills.  [place] will continue to have a positive outlook with good coping skills.  [place] will continue to have a positive outlook with good coping skills.     Interventions  Encouraged to attend Cardiac Rehabilitation for the exercise  Encouraged to attend Cardiac  Rehabilitation for the exercise  Encouraged to attend Cardiac Rehabilitation for the exercise     Continue Psychosocial Services   No Follow up required  No Follow up required  No Follow up required        Psychosocial Discharge (Final Psychosocial Re-Evaluation): Psychosocial Re-Evaluation - 09/27/18 1056      Psychosocial Re-Evaluation   Current issues with  None Identified    Comments  No intervention necessary.    Expected Outcomes  Ritchie will continue to have a positive outlook with good coping skills.    Interventions  Encouraged to attend Cardiac Rehabilitation for the exercise    Continue Psychosocial Services   No Follow up required       Vocational Rehabilitation: Provide vocational rehab assistance to qualifying candidates.   Vocational Rehab Evaluation & Intervention: Vocational Rehab - 07/05/18 1034      Initial Vocational Rehab Evaluation & Intervention   Assessment shows need for Vocational Rehabilitation  No       Education: Education Goals: Education classes will be provided on a weekly basis,  covering required topics. Participant will state understanding/return demonstration of topics presented.  Learning Barriers/Preferences: Learning Barriers/Preferences - 07/05/18 1145      Learning Barriers/Preferences   Learning Barriers  None    Learning Preferences  Written Material;Skilled Demonstration       Education Topics: Count Your Pulse:  -Group instruction provided by verbal instruction, demonstration, patient participation and written materials to support subject.  Instructors address importance of being able to find your pulse and how to count your pulse when at home without a heart monitor.  Patients get hands on experience counting their pulse with staff help and individually.   CARDIAC REHAB PHASE II EXERCISE from 09/21/2018 in [place]  Date  09/21/18  Instruction Review Code  2- Demonstrated Understanding      Heart Attack, Angina, and Risk Factor Modification:  -Group instruction provided by verbal instruction, video, and written materials to support subject.  Instructors address signs and symptoms of angina and heart attacks.    Also discuss risk factors for heart disease and how to make changes to improve heart health risk factors.   CARDIAC REHAB PHASE II EXERCISE from 09/21/2018 in [place]  Date  08/29/18  Instruction Review Code  2- Demonstrated Understanding      Functional Fitness:  -Group instruction provided by verbal instruction, demonstration, patient participation, and written materials to support subject.  Instructors address safety measures for doing things around the house.  Discuss how to get up and down off the floor, how to pick things up properly, how to safely get out of a chair without assistance, and balance training.   CARDIAC REHAB PHASE II EXERCISE from 09/21/2018 in [place]  Date  08/17/18  Instruction Review Code  2- Demonstrated  Understanding      Meditation and Mindfulness:  -Group instruction provided by verbal instruction, patient participation, and written materials to support subject.  Instructor addresses importance of mindfulness and meditation practice to help reduce stress and improve awareness.  Instructor also leads participants through a meditation exercise.    CARDIAC REHAB PHASE II EXERCISE from 09/21/2018 in [place]  Date  09/19/18  Educator  Jeanella Craze  Instruction Review Code  2- Demonstrated Understanding      Stretching for Flexibility and Mobility:  -Group instruction provided by verbal instruction, patient participation,  and written materials to support subject.  Instructors lead participants through series of stretches that are designed to increase flexibility thus improving mobility.  These stretches are additional exercise for major muscle groups that are typically performed during regular warm up and cool down.   Hands Only CPR:  -Group verbal, video, and participation provides a basic overview of AHA guidelines for community CPR. Role-play of emergencies allow participants the opportunity to practice calling for help and chest compression technique with discussion of AED use.   Hypertension: -Group verbal and written instruction that provides a basic overview of hypertension including the most recent diagnostic guidelines, risk factor reduction with self-care instructions and medication management.   CARDIAC REHAB PHASE II EXERCISE from 09/21/2018 in [place]  Date  09/07/18  Instruction Review Code  2- Demonstrated Understanding       Nutrition I class: Heart Healthy Eating:  -Group instruction provided by PowerPoint slides, verbal discussion, and written materials to support subject matter. The instructor gives an explanation and review of the Therapeutic Lifestyle Changes diet recommendations, which includes a  discussion on lipid goals, dietary fat, sodium, fiber, plant stanol/sterol esters, sugar, and the components of a well-balanced, healthy diet.   Nutrition II class: Lifestyle Skills:  -Group instruction provided by PowerPoint slides, verbal discussion, and written materials to support subject matter. The instructor gives an explanation and review of label reading, grocery shopping for heart health, heart healthy recipe modifications, and ways to make healthier choices when eating out.   Diabetes Question & Answer:  -Group instruction provided by PowerPoint slides, verbal discussion, and written materials to support subject matter. The instructor gives an explanation and review of diabetes co-morbidities, pre- and post-prandial blood glucose goals, pre-exercise blood glucose goals, signs, symptoms, and treatment of hypoglycemia and hyperglycemia, and foot care basics.   Diabetes Blitz:  -Group instruction provided by PowerPoint slides, verbal discussion, and written materials to support subject matter. The instructor gives an explanation and review of the physiology behind type 1 and type 2 diabetes, diabetes medications and rational behind using different medications, pre- and post-prandial blood glucose recommendations and Hemoglobin A1c goals, diabetes diet, and exercise including blood glucose guidelines for exercising safely.    Portion Distortion:  -Group instruction provided by PowerPoint slides, verbal discussion, written materials, and food models to support subject matter. The instructor gives an explanation of serving size versus portion size, changes in portions sizes over the last 20 years, and what consists of a serving from each food group.   Stress Management:  -Group instruction provided by verbal instruction, video, and written materials to support subject matter.  Instructors review role of stress in heart disease and how to cope with stress positively.     CARDIAC REHAB PHASE  II EXERCISE from 09/21/2018 in [place]  Date  08/15/18  Instruction Review Code  2- Demonstrated Understanding      Exercising on Your Own:  -Group instruction provided by verbal instruction, power point, and written materials to support subject.  Instructors discuss benefits of exercise, components of exercise, frequency and intensity of exercise, and end points for exercise.  Also discuss use of nitroglycerin and activating EMS.  Review options of places to exercise outside of rehab.  Review guidelines for sex with heart disease.   CARDIAC REHAB PHASE II EXERCISE from 09/21/2018 in [place]  Date  09/12/18  Educator  EP  Instruction Review Code  2-  Demonstrated Understanding      Cardiac Drugs I:  -Group instruction provided by verbal instruction and written materials to support subject.  Instructor reviews cardiac drug classes: antiplatelets, anticoagulants, beta blockers, and statins.  Instructor discusses reasons, side effects, and lifestyle considerations for each drug class.   CARDIAC REHAB PHASE II EXERCISE from 09/21/2018 in [place]  Date  08/08/18  Instruction Review Code  2- Demonstrated Understanding      Cardiac Drugs II:  -Group instruction provided by verbal instruction and written materials to support subject.  Instructor reviews cardiac drug classes: angiotensin converting enzyme inhibitors (ACE-I), angiotensin II receptor blockers (ARBs), nitrates, and calcium channel blockers.  Instructor discusses reasons, side effects, and lifestyle considerations for each drug class.   CARDIAC REHAB PHASE II EXERCISE from 09/21/2018 in [place]  Date  09/05/18  Educator  Pharmacist  Instruction Review Code  2- Demonstrated Understanding      Anatomy and Physiology of the Circulatory System:  Group verbal and written instruction and models  provide basic cardiac anatomy and physiology, with the coronary electrical and arterial systems. Review of: AMI, Angina, Valve disease, Heart Failure, Peripheral Artery Disease, Cardiac Arrhythmia, Pacemakers, and the ICD.   CARDIAC REHAB PHASE II EXERCISE from 09/21/2018 in [place]  Date  08/22/18  Instruction Review Code  2- Demonstrated Understanding      Other Education:  -Group or individual verbal, written, or video instructions that support the educational goals of the cardiac rehab program.   Holiday Eating Survival Tips:  -Group instruction provided by PowerPoint slides, verbal discussion, and written materials to support subject matter. The instructor gives patients tips, tricks, and techniques to help them not only survive but enjoy the holidays despite the onslaught of food that accompanies the holidays.   Knowledge Questionnaire Score: Knowledge Questionnaire Score - 07/05/18 1034      Knowledge Questionnaire Score   Pre Score  23/24       Core Components/Risk Factors/Patient Goals at Admission: Personal Goals and Risk Factors at Admission - 08/02/18 0658      Core Components/Risk Factors/Patient Goals on Admission    Weight Management  Obesity;Yes    Intervention  Weight Management: Develop a combined nutrition and exercise program designed to reach desired caloric intake, while maintaining appropriate intake of nutrient and fiber, sodium and fats, and appropriate energy expenditure required for the weight goal.;Weight Management/Obesity: Establish reasonable short term and long term weight goals.;Weight Management: Provide education and appropriate resources to help participant work on and attain dietary goals.;Obesity: Provide education and appropriate resources to help participant work on and attain dietary goals.    Admit Weight  238 lb 15.7 oz (108.4 kg)    Goal Weight: Long Term  228 lb (103.4 kg)    Expected Outcomes  Short Term:  Continue to assess and modify interventions until short term weight is achieved    Hypertension  Yes    Intervention  Provide education on lifestyle modifcations including regular physical activity/exercise, weight management, moderate sodium restriction and increased consumption of fresh fruit, vegetables, and low fat dairy, alcohol moderation, and smoking cessation.;Monitor prescription use compliance.    Expected Outcomes  Short Term: Continued assessment and intervention until BP is < 140/20mm HG in hypertensive participants. < 130/76mm HG in hypertensive participants with diabetes, heart failure or chronic kidney disease.;Long Term: Maintenance of blood pressure at goal levels.    Lipids  Yes  Intervention  Provide education and support for participant on nutrition & aerobic/resistive exercise along with prescribed medications to achieve LDL 70mg , HDL >40mg .    Expected Outcomes  Short Term: Participant states understanding of desired cholesterol values and is compliant with medications prescribed. Participant is following exercise prescription and nutrition guidelines.;Long Term: Cholesterol controlled with medications as prescribed, with individualized exercise RX and with personalized nutrition plan. Value goals: LDL < 70mg , HDL > 40 mg.       Core Components/Risk Factors/Patient Goals Review:  Goals and Risk Factor Review    Row Name 08/02/18 0708 08/30/18 1543 09/27/18 1056         Core Components/Risk Factors/Patient Goals Review   Personal Goals Review  Weight Management/Obesity;Hypertension;Lipids  Weight Management/Obesity;Hypertension;Lipids  Weight Management/Obesity;Hypertension;Lipids     Review  Pt willing to participate in CR exercise.  Kimoni would like to increase his strength and stamina.  He is tolerating exercise well so far.   Pt willing to participate in CR exercise.  Espiridion is tolerating exercise well.  He feels that he is becoming more limber.   Pt willing to  participate in CR exercise.  Contrell is tolerating exercise well.  He feels that he is increasing his strength and stamina.      Expected Outcomes  Khiree will participate in CR exercise, nutrition, and lifestyle modification opportunities.   Maycen will participate in CR exercise, nutrition, and lifestyle modification opportunities.   Champ will participate in CR exercise, nutrition, and lifestyle modification opportunities.         Core Components/Risk Factors/Patient Goals at Discharge (Final Review):  Goals and Risk Factor Review - 09/27/18 1056      Core Components/Risk Factors/Patient Goals Review   Personal Goals Review  Weight Management/Obesity;Hypertension;Lipids    Review  Pt willing to participate in CR exercise.  Alvis is tolerating exercise well.  He feels that he is increasing his strength and stamina.     Expected Outcomes  Diezel will participate in CR exercise, nutrition, and lifestyle modification opportunities.        ITP Comments: ITP Comments    Row Name 07/05/18 1128 08/01/18 1725 08/30/18 1542 09/27/18 1055     ITP Comments  Dr. Fransico Him, Medical Director   30 Day ITP Review.  Pt has tolerated starting exercise. His vital signs are stable.   30 Day ITP Review.  Pt has tolerated starting exercise. Dreon feels that he is becoming more limber.   30 Day ITP Review. Tajh contiues to tolerate exercise.  He feels that he is gaining strength and stamina.  Osmany will graduate from the program soon.        Comments: See ITP Comments.

## 2018-09-28 ENCOUNTER — Encounter (HOSPITAL_COMMUNITY)
Admission: RE | Admit: 2018-09-28 | Discharge: 2018-09-28 | Disposition: A | Discharge status: Home / Self Care | Payer: PPO | Source: Ambulatory Visit | Attending: Cardiology | Admitting: Cardiology

## 2018-09-28 ENCOUNTER — Encounter (HOSPITAL_COMMUNITY): Payer: PPO

## 2018-09-28 DIAGNOSIS — I428 Other cardiomyopathies: Secondary | ICD-10-CM | POA: Diagnosis not present

## 2018-09-28 DIAGNOSIS — E785 Hyperlipidemia, unspecified: Secondary | ICD-10-CM | POA: Insufficient documentation

## 2018-09-28 DIAGNOSIS — I5020 Unspecified systolic (congestive) heart failure: Secondary | ICD-10-CM | POA: Diagnosis present

## 2018-09-28 DIAGNOSIS — Z7982 Long term (current) use of aspirin: Secondary | ICD-10-CM | POA: Insufficient documentation

## 2018-09-28 DIAGNOSIS — I5022 Chronic systolic (congestive) heart failure: Secondary | ICD-10-CM | POA: Diagnosis not present

## 2018-09-28 DIAGNOSIS — G629 Polyneuropathy, unspecified: Secondary | ICD-10-CM | POA: Insufficient documentation

## 2018-09-28 DIAGNOSIS — Z87891 Personal history of nicotine dependence: Secondary | ICD-10-CM | POA: Diagnosis not present

## 2018-09-28 DIAGNOSIS — I110 Hypertensive heart disease with heart failure: Secondary | ICD-10-CM | POA: Diagnosis not present

## 2018-09-28 DIAGNOSIS — Z79899 Other long term (current) drug therapy: Secondary | ICD-10-CM | POA: Diagnosis not present

## 2018-10-01 ENCOUNTER — Encounter (HOSPITAL_COMMUNITY)
Admission: RE | Admit: 2018-10-01 | Discharge: 2018-10-01 | Disposition: A | Discharge status: Home / Self Care | Payer: PPO | Source: Ambulatory Visit | Attending: Cardiology | Admitting: Cardiology

## 2018-10-01 ENCOUNTER — Encounter (HOSPITAL_COMMUNITY): Payer: PPO

## 2018-10-01 DIAGNOSIS — I110 Hypertensive heart disease with heart failure: Secondary | ICD-10-CM | POA: Diagnosis not present

## 2018-10-01 DIAGNOSIS — I5022 Chronic systolic (congestive) heart failure: Secondary | ICD-10-CM

## 2018-10-03 ENCOUNTER — Encounter (HOSPITAL_COMMUNITY): Payer: PPO

## 2018-10-03 ENCOUNTER — Encounter (HOSPITAL_COMMUNITY): Payer: Self-pay

## 2018-10-03 ENCOUNTER — Encounter (HOSPITAL_COMMUNITY)
Admission: RE | Admit: 2018-10-03 | Discharge: 2018-10-03 | Disposition: A | Discharge status: Home / Self Care | Payer: PPO | Source: Ambulatory Visit | Attending: Cardiology | Admitting: Cardiology

## 2018-10-03 DIAGNOSIS — I110 Hypertensive heart disease with heart failure: Secondary | ICD-10-CM | POA: Diagnosis not present

## 2018-10-03 DIAGNOSIS — I5022 Chronic systolic (congestive) heart failure: Secondary | ICD-10-CM

## 2018-10-05 ENCOUNTER — Encounter (HOSPITAL_COMMUNITY): Payer: PPO

## 2018-10-08 ENCOUNTER — Encounter (HOSPITAL_COMMUNITY): Payer: PPO

## 2018-10-09 ENCOUNTER — Ambulatory Visit (HOSPITAL_COMMUNITY)
Admission: RE | Admit: 2018-10-09 | Discharge: 2018-10-09 | Disposition: A | Discharge status: Home / Self Care | Payer: PPO | Source: Ambulatory Visit | Attending: Internal Medicine | Admitting: Internal Medicine

## 2018-10-09 DIAGNOSIS — I420 Dilated cardiomyopathy: Secondary | ICD-10-CM | POA: Insufficient documentation

## 2018-10-09 LAB — BASIC METABOLIC PANEL
ANION GAP: 6 (ref 5–15)
BUN: 17 mg/dL (ref 8–23)
CHLORIDE: 108 mmol/L (ref 98–111)
CO2: 27 mmol/L (ref 22–32)
Calcium: 9.5 mg/dL (ref 8.9–10.3)
Creatinine, Ser: 1.09 mg/dL (ref 0.61–1.24)
GFR calc Af Amer: >60 mL/min (ref >60)
GFR calc non Af Amer: >60 mL/min (ref >60)
GLUCOSE: 94 mg/dL (ref 70–99)
Potassium: 4.1 mmol/L (ref 3.5–5.1)
Sodium: 141 mmol/L (ref 135–145)

## 2018-10-10 ENCOUNTER — Encounter (HOSPITAL_COMMUNITY): Payer: PPO

## 2018-10-12 ENCOUNTER — Encounter (HOSPITAL_COMMUNITY): Payer: PPO

## 2018-10-15 ENCOUNTER — Encounter (HOSPITAL_COMMUNITY): Payer: PPO

## 2018-10-18 NOTE — Progress Notes (Signed)
Discharge Progress Report  Patient Details  Name: Kevin Murillo MRN: [id number] Date of Birth: Jun 13, 1946 Referring Provider:     CARDIAC REHAB PHASE II ORIENTATION from 07/05/2018 in [place]  Referring Provider  Larey Dresser MD        Number of Visits: 36  Reason for Discharge:  Patient reached a stable level of exercise. Patient independent in their exercise. Patient has met program and personal goals.  Smoking History:  Social History   Tobacco Use  Smoking Status Former Smoker  . Types: Cigarettes  . Last attempt to quit: 03/12/2002  . Years since quitting: 16.6  Smokeless Tobacco Never Used    Diagnosis:  Heart failure, chronic systolic ([place])  ADL UCSD:   Initial Exercise Prescription: Initial Exercise Prescription - 07/05/18 1100      Date of Initial Exercise RX and Referring Provider   Date  07/05/18    Referring Provider  Larey Dresser MD     Expected Discharge Date  09/28/18      Recumbant Bike   Level  1.5    Watts  10    Minutes  10    METs  2.3      NuStep   Level  2    SPM  75    Minutes  10    METs  1.5      Track   Laps  7    Minutes  10    METs  2.23      Prescription Details   Frequency (times per week)  3x    Duration  Progress to 30 minutes of continuous aerobic without signs/symptoms of physical distress      Intensity   THRR 40-80% of Max Heartrate  59-118    Ratings of Perceived Exertion  11-13    Perceived Dyspnea  0-4      Progression   Progression  Continue progressive overload as per policy without signs/symptoms or physical distress.      Resistance Training   Training Prescription  Yes    Weight  4lbs    Reps  10-15       Discharge Exercise Prescription (Final Exercise Prescription Changes): Exercise Prescription Changes - 10/03/18 1400      Response to Exercise   Blood Pressure (Admit)  130/80    Blood Pressure (Exercise)  140/78    Blood Pressure (Exit)   130/84    Heart Rate (Admit)  69 bpm    Heart Rate (Exercise)  101 bpm    Heart Rate (Exit)  64 bpm    Rating of Perceived Exertion (Exercise)  12    Perceived Dyspnea (Exercise)  0    Symptoms  None    Comments  Pt graduated from Cardiac Rehab    Duration  Progress to 45 minutes of aerobic exercise without signs/symptoms of physical distress    Intensity  THRR unchanged      Progression   Progression  Continue to progress workloads to maintain intensity without signs/symptoms of physical distress.    Average METs  2.6      Resistance Training   Training Prescription  No      Interval Training   Interval Training  No      NuStep   Level  5    SPM  100    Minutes  10    METs  3      Arm Ergometer   Level  4    Watts  30    Minutes  10    METs  2.2      Track   Laps  10    Minutes  10    METs  2.76      Home Exercise Plan   Plans to continue exercise at  Home (comment)   [employer]akers: [employer]   Frequency  Add 2 additional days to program exercise sessions.    Initial Home Exercises Provided  07/25/18       Functional Capacity: 6 Minute Walk    Row Name 07/05/18 1129 09/21/18 1401       6 Minute Walk   Phase  Initial  Initial    Distance  900 feet  1400 feet    Distance % Change  -  55.56 %    Distance Feet Change  -  500 ft    Walk Time  6 minutes  6 minutes    # of Rest Breaks  0  0    MPH  1.7  2.65    METS  1.5  2.64    RPE  11  13    Perceived Dyspnea   0  0    VO2 Peak  5.25  9.25    Symptoms  No  No    Resting HR  75 bpm  74 bpm    Resting BP  104/70  106/70    Resting Oxygen Saturation   98 %  -    Exercise Oxygen Saturation  during 6 min walk  96 %  -    Max Ex. HR  104 bpm  116 bpm    Max Ex. BP  118/76  142/84    2 Minute Post BP  118/72  110/70       Psychological, QOL, Others - Outcomes: PHQ 2/9: Depression screen [facility] 2/9 10/03/2018 07/13/2018  Decreased Interest 0 0  Down, Depressed, Hopeless 0 0  PHQ - 2 Score 0 0     Quality of Life: Quality of Life - 10/08/18 0939      Quality of Life   Select  Quality of Life      Quality of Life Scores   Health/Function Pre  24.33 %    Health/Function Post  23.5 %    Health/Function % Change  -3.41 %    Socioeconomic Pre  28.5 %    Socioeconomic Post  24 %    Socioeconomic % Change   -15.79 %    Psych/Spiritual Pre  26.57 %    Psych/Spiritual Post  23.79 %    Psych/Spiritual % Change  -10.46 %    Family Pre  25.2 %    Family Post  19.2 %    Family % Change  -23.81 %    GLOBAL Pre  25.86 %    GLOBAL Post  23.03 %    GLOBAL % Change  -10.94 %       Personal Goals: Goals established at orientation with interventions provided to work toward goal. Personal Goals and Risk Factors at Admission - 08/02/18 0658      Core Components/Risk Factors/Patient Goals on Admission    Weight Management  Obesity;Yes    Intervention  Weight Management: Develop a combined nutrition and exercise program designed to reach desired caloric intake, while maintaining appropriate intake of nutrient and fiber, sodium and fats, and appropriate energy expenditure required for the weight goal.;Weight Management/Obesity: Establish reasonable short term  and long term weight goals.;Weight Management: Provide education and appropriate resources to help participant work on and attain dietary goals.;Obesity: Provide education and appropriate resources to help participant work on and attain dietary goals.    Admit Weight  238 lb 15.7 oz (108.4 kg)    Goal Weight: Long Term  228 lb (103.4 kg)    Expected Outcomes  Short Term: Continue to assess and modify interventions until short term weight is achieved    Hypertension  Yes    Intervention  Provide education on lifestyle modifcations including regular physical activity/exercise, weight management, moderate sodium restriction and increased consumption of fresh fruit, vegetables, and low fat dairy, alcohol moderation, and smoking  cessation.;Monitor prescription use compliance.    Expected Outcomes  Short Term: Continued assessment and intervention until BP is < 140/37m HG in hypertensive participants. < 130/874mHG in hypertensive participants with diabetes, heart failure or chronic kidney disease.;Long Term: Maintenance of blood pressure at goal levels.    Lipids  Yes    Intervention  Provide education and support for participant on nutrition & aerobic/resistive exercise along with prescribed medications to achieve LDL <7063mHDL >34m41m  Expected Outcomes  Short Term: Participant states understanding of desired cholesterol values and is compliant with medications prescribed. Participant is following exercise prescription and nutrition guidelines.;Long Term: Cholesterol controlled with medications as prescribed, with individualized exercise RX and with personalized nutrition plan. Value goals: LDL < 70mg21mL > 40 mg.        Personal Goals Discharge: Goals and Risk Factor Review    Row Name 08/02/18 0708 08/30/18 1543 09/27/18 1056 10/03/18 1447       Core Components/Risk Factors/Patient Goals Review   Personal Goals Review  Weight Management/Obesity;Hypertension;Lipids  Weight Management/Obesity;Hypertension;Lipids  Weight Management/Obesity;Hypertension;Lipids  Weight Management/Obesity;Hypertension;Lipids    Review  Pt willing to participate in CR exercise.  ClareNishand like to increase his strength and stamina.  He is tolerating exercise well so far.   Pt willing to participate in CR exercise.  ClareCambrenolerating exercise well.  He feels that he is becoming more limber.   Pt willing to participate in CR exercise.  ClareRamessesolerating exercise well.  He feels that he is increasing his strength and stamina.   ClareBenngraduated from CR [employer] 36 complete sessions.  He has increased his confidence in his ability to exercise, strength and stamina.     Expected Outcomes  ClareJacaden participate in CR exercise,  nutrition, and lifestyle modification opportunities.   ClareCiaran participate in CR exercise, nutrition, and lifestyle modification opportunities.   ClareSol participate in CR exercise, nutrition, and lifestyle modification opportunities.   ClareKenith participate in exercise, nutrition, and lifestyle modification opportunities. He plans to utilize his [employer]akers benefit [facility] a [facility]    Exercise Goals and Review: Exercise Goals    Row Name 07/05/18 1133             Exercise Goals   Increase Physical Activity  Yes       Intervention  Develop an individualized exercise prescription for aerobic and resistive training based on initial evaluation findings, risk stratification, comorbidities and participant's personal goals.;Provide advice, education, support and counseling about physical activity/exercise needs.       Expected Outcomes  Short Term: Attend rehab on a regular basis to increase amount of physical activity.;Long Term: Add in home exercise to make exercise part of routine and to increase amount  of physical activity.;Long Term: Exercising regularly at least 3-5 days a week.       Increase Strength and Stamina  Yes       Intervention  Provide advice, education, support and counseling about physical activity/exercise needs.;Develop an individualized exercise prescription for aerobic and resistive training based on initial evaluation findings, risk stratification, comorbidities and participant's personal goals.       Expected Outcomes  Short Term: Increase workloads from initial exercise prescription for resistance, speed, and METs.;Short Term: Perform resistance training exercises routinely during rehab and add in resistance training at home;Long Term: Improve cardiorespiratory fitness, muscular endurance and strength as measured by increased METs and functional capacity (6MWT)       Able to understand and use rate of perceived exertion (RPE) scale  Yes        Intervention  Provide education and explanation on how to use RPE scale       Expected Outcomes  Short Term: Able to use RPE daily in rehab to express subjective intensity level;Long Term:  Able to use RPE to guide intensity level when exercising independently       Knowledge and understanding of Target Heart Rate Range (THRR)  Yes       Intervention  Provide education and explanation of THRR including how the numbers were predicted and where they are located for reference       Expected Outcomes  Short Term: Able to state/look up THRR;Short Term: Able to use daily as guideline for intensity in rehab;Long Term: Able to use THRR to govern intensity when exercising independently       Able to check pulse independently  Yes       Intervention  Provide education and demonstration on how to check pulse in carotid and radial arteries.;Review the importance of being able to check your own pulse for safety during independent exercise       Expected Outcomes  Short Term: Able to explain why pulse checking is important during independent exercise;Long Term: Able to check pulse independently and accurately       Understanding of Exercise Prescription  Yes       Intervention  Provide education, explanation, and written materials on patient's individual exercise prescription       Expected Outcomes  Short Term: Able to explain program exercise prescription;Long Term: Able to explain home exercise prescription to exercise independently          Exercise Goals Re-Evaluation: Exercise Goals Re-Evaluation    Row Name 07/25/18 1402 08/30/18 0756 09/27/18 1432 10/03/18 1408       Exercise Goal Re-Evaluation   Exercise Goals Review  Increase Physical Activity;Able to understand and use rate of perceived exertion (RPE) scale;Knowledge and understanding of Target Heart Rate Range (THRR);Understanding of Exercise Prescription;Increase Strength and Stamina;Able to check pulse independently  Increase Physical  Activity;Understanding of Exercise Prescription  Understanding of Exercise Prescription  Understanding of Exercise Prescription;Increase Physical Activity    Comments  Reviewed HEP with pt. Also reviewed THRR, RPE Scale, weather precautions, endpoints of exercise, NTG use, warmup and cool down.  Pt is responding well workload increases. Pt is now exercising at a level 4 on Arm Crank and level 5 on Nustep. Pt shows motivation to put forth effort while in rehab. Will continue to work with pt to be motivated to exercise on his own.   Pt is continuing to make great progress while in rehab. Pt is now able to walk 10 laps in 10 minutes  with no issues. One of pt's goals was to increase his strength and stamina. Pt has reached that goal and is continuing to increase his cardiorespiratory fitness.   Pt completed 36 sessions of Cardiac Rehab. Pt increased functional capacity by 55.56%. Pt increased post 6MWT distance by 532f. Pt has increased his strength and stamina. Pt is highly motivated to continue to exercise on his own. Spoke with pt about importance of continuing to exericse most days of the week.     Expected Outcomes  Pt plans to walk 2 days a week for 10-15 minutes. Pt will gradually work to increase walking time. Pt will continue to increase stamina. Will continue to monitor pt.  Pt will continue to walk for exercise 2 days a week 15 minutes. Pt will continue to increase cardiorespiratory fitness. Will continue to monitor.   Pt will continue to walk 2-3 days for 15 minutes. Pt has 3 more rehab sessions left. [facility].  [facility] [employer] [employer] Pt will plan to exercise 3-4 days a week for 30-45 minutes. [facility] [employer] well since it is close to home. Pt is very greateful for program and states he has his confidence back to exercise on his own.        Nutrition & Weight - Outcomes: Pre Biometrics -  07/05/18 1133      Pre Biometrics   Height  5' 8.5" (1.74 m)    Weight  108.4 kg    Waist Circumference  49 inches    Hip Circumference  47 inches    Waist to Hip Ratio  1.04 %    BMI (Calculated)  35.8    Triceps Skinfold  25 mm    % Body Fat  37.1 %    Grip Strength  38 kg    Flexibility  9.5 in    Single Leg Stand  4.87 seconds      Post Biometrics - 09/21/18 1403       Post  Biometrics   Height  5' 8.5" (1.74 m)    Weight  110.4 kg    Waist Circumference  49 inches    Hip Circumference  46 inches    Waist to Hip Ratio  1.07 %    BMI (Calculated)  36.46    Triceps Skinfold  26 mm    % Body Fat  37.5 %    Grip Strength  27 kg    Flexibility  10.5 in    Single Leg Stand  3.53 seconds       Nutrition: Nutrition Therapy & Goals - 07/05/18 0901      Nutrition Therapy   Diet  heart healthy      Personal Nutrition Goals   Nutrition Goal  Pt to identify and limit food sources of saturated fat, trans fat, and sodium    Personal Goal #2  Pt to identify food quantities necessary to achieve weight loss of 6-24 lbs. at graduation from cardiac rehab    Personal Goal #3  Pt to decrease number of meals eaten away from home      [place] educate and counsel regarding individualized specific dietary modifications aiming towards targeted core components such as weight, hypertension, lipid management, diabetes, heart failure and other comorbidities.    Expected Outcomes  Short Term Goal: Understand basic principles of dietary content, such  as calories, fat, sodium, cholesterol and nutrients.       Nutrition Discharge: Nutrition Assessments - 10/08/18 0822      MEDFICTS Scores   Pre Score  68    Post Score  46    Score Difference  -22       Education Questionnaire Score: Knowledge Questionnaire Score - 10/03/18 1402      Knowledge Questionnaire Score   Pre Score  23/24    Post Score  22/24       Goals reviewed with patient; copy  given to patient.

## 2018-11-12 DIAGNOSIS — Z6838 Body mass index (BMI) 38.0-38.9, adult: Secondary | ICD-10-CM | POA: Diagnosis not present

## 2018-11-12 DIAGNOSIS — I5022 Chronic systolic (congestive) heart failure: Secondary | ICD-10-CM | POA: Diagnosis not present

## 2018-11-12 DIAGNOSIS — M1990 Unspecified osteoarthritis, unspecified site: Secondary | ICD-10-CM | POA: Diagnosis not present

## 2018-11-12 DIAGNOSIS — Z1389 Encounter for screening for other disorder: Secondary | ICD-10-CM | POA: Diagnosis not present

## 2018-11-12 DIAGNOSIS — I110 Hypertensive heart disease with heart failure: Secondary | ICD-10-CM | POA: Diagnosis not present

## 2018-11-12 DIAGNOSIS — Z23 Encounter for immunization: Secondary | ICD-10-CM | POA: Diagnosis not present

## 2018-11-12 DIAGNOSIS — I2721 Secondary pulmonary arterial hypertension: Secondary | ICD-10-CM | POA: Diagnosis not present

## 2018-12-20 ENCOUNTER — Other Ambulatory Visit: Payer: Self-pay | Admitting: Physician Assistant

## 2018-12-20 DIAGNOSIS — I428 Other cardiomyopathies: Secondary | ICD-10-CM

## 2019-01-17 ENCOUNTER — Encounter (HOSPITAL_COMMUNITY): Payer: Self-pay | Admitting: Cardiology

## 2019-01-17 ENCOUNTER — Other Ambulatory Visit: Payer: Self-pay

## 2019-01-17 ENCOUNTER — Ambulatory Visit (HOSPITAL_COMMUNITY)
Admission: RE | Admit: 2019-01-17 | Discharge: 2019-01-17 | Disposition: A | Discharge status: Home / Self Care | Payer: PPO | Source: Ambulatory Visit | Attending: Internal Medicine | Admitting: Internal Medicine

## 2019-01-17 ENCOUNTER — Ambulatory Visit (HOSPITAL_BASED_OUTPATIENT_CLINIC_OR_DEPARTMENT_OTHER)
Admission: RE | Admit: 2019-01-17 | Discharge: 2019-01-17 | Disposition: A | Discharge status: Home / Self Care | Payer: PPO | Source: Ambulatory Visit | Attending: Cardiology | Admitting: Cardiology

## 2019-01-17 VITALS — BP 120/80 | HR 67 | Wt 253.6 lb

## 2019-01-17 DIAGNOSIS — Z8249 Family history of ischemic heart disease and other diseases of the circulatory system: Secondary | ICD-10-CM | POA: Diagnosis not present

## 2019-01-17 DIAGNOSIS — R001 Bradycardia, unspecified: Secondary | ICD-10-CM | POA: Diagnosis not present

## 2019-01-17 DIAGNOSIS — Z79899 Other long term (current) drug therapy: Secondary | ICD-10-CM | POA: Diagnosis not present

## 2019-01-17 DIAGNOSIS — E785 Hyperlipidemia, unspecified: Secondary | ICD-10-CM | POA: Diagnosis not present

## 2019-01-17 DIAGNOSIS — R9431 Abnormal electrocardiogram [ECG] [EKG]: Secondary | ICD-10-CM | POA: Insufficient documentation

## 2019-01-17 DIAGNOSIS — I110 Hypertensive heart disease with heart failure: Secondary | ICD-10-CM | POA: Insufficient documentation

## 2019-01-17 DIAGNOSIS — I420 Dilated cardiomyopathy: Secondary | ICD-10-CM | POA: Insufficient documentation

## 2019-01-17 DIAGNOSIS — I5022 Chronic systolic (congestive) heart failure: Secondary | ICD-10-CM

## 2019-01-17 DIAGNOSIS — Z87891 Personal history of nicotine dependence: Secondary | ICD-10-CM | POA: Diagnosis not present

## 2019-01-17 DIAGNOSIS — Z7982 Long term (current) use of aspirin: Secondary | ICD-10-CM | POA: Insufficient documentation

## 2019-01-17 DIAGNOSIS — I509 Heart failure, unspecified: Secondary | ICD-10-CM | POA: Insufficient documentation

## 2019-01-17 LAB — BASIC METABOLIC PANEL
Anion gap: 9 (ref 5–15)
BUN: 14 mg/dL (ref 8–23)
CO2: 27 mmol/L (ref 22–32)
Calcium: 9.2 mg/dL (ref 8.9–10.3)
Chloride: 104 mmol/L (ref 98–111)
Creatinine, Ser: 0.98 mg/dL (ref 0.61–1.24)
GFR calc non Af Amer: >60 mL/min (ref >60)
Glucose, Bld: 93 mg/dL (ref 70–99)
Potassium: 4.5 mmol/L (ref 3.5–5.1)
Sodium: 140 mmol/L (ref 135–145)

## 2019-01-17 LAB — LIPID PANEL
Cholesterol: 108 mg/dL (ref 0–200)
HDL: 45 mg/dL (ref >40)
LDL Cholesterol: 41 mg/dL (ref 0–99)
TRIGLYCERIDES: 110 mg/dL (ref <150)
Total CHOL/HDL Ratio: 2.4 RATIO
VLDL: 22 mg/dL (ref 0–40)

## 2019-01-17 MED ORDER — ISOSORB DINITRATE-HYDRALAZINE 20-37.5 MG PO TABS: 1.0000 | ORAL_TABLET | Freq: Three times a day (TID) | ORAL | 3 refills | Status: DC

## 2019-01-17 NOTE — Progress Notes (Signed)
Patient ID: Kevin Murillo, male   DOB: 12-May-1946, 73 y.o.   MRN: [id number] PCP: Dr. Virgina Jock Cardiology: Dr. Aundra Dubin  73 y.o.with history of HTN and nonischemic cardiomyopathy (diagnosed in 2011) presents for cardiology followup.  He had an echo in 1/15, showing that EF remains 25-30% with mild LV dilation.  I had him do a cardiopulmonary exercise test in 3/15 that showed normal functional capacity.  Most recent echo in 4/19 showed EF remains 25-30%.  Repeat LHC/RHC in 4/19 showed no significant coronary disease, mildly elevated LV filling pressure, and CI 2.01.   Echo was done today and reviewed: EF 30-35%, normal RV.   He return for followup of CHF.  He is symptomatically stable.  Weight is up but he has not been exercising as much since he finished cardiac rehab.  No significant exertional dyspnea. No orthopnea/PND.  No chest pain.  No lightheadedness.  BP is stable.   ECG (personally reviewed): NSR, inferolateral TWIs, narrow QRS  Labs (6/11): K 3.9, TSH normal, creatinine 1.0  Labs (7/11): HDL 60, LDL 122, K 4.4, creatinine 1.0, BNP 86, SPEP normal, transferrin saturation 24%  Labs (9/11): LDL 67, HDL 48, TGs 145, LFTs normal  Labs (10/11): K 4, creatinine 1.2  Labs (5/13): K 3.9, creatinine 1.0, LDL 66, HDL 44 Labs (7/14): K 3.9, creatinine 1.1 Labs (3/15): K 4.1, creatinine 1.1 Labs (8/15): K 4, creatinine 1.1, BNP 21 Labs (4/19): K 4.4, creatinine 1.06 Labs (5/19): K 4.4, creatinine 1.13 Labs (8/19): K 4.2, creatinine 1.29 Labs (11/19): K 4.1, creatinine 1.09  Allergies (verified):  No Known Drug Allergies   Past Medical History:  1. HTN  2. Left hand neuropathy  3. Nonischemic cardiomyopathy: Echo (6/11) with EF 30-35%, posteroinferior severe hypokinesis, moderate diastolic dysfunction, mild left atrial enlargement, mild LV hypertrophy. LHC (7/11): EF 30-35% with diffuse hypokinesis, no angiographic CAD. SPEP and Fe studies unremarkable, TSH normal.  Repeat echo (1/12)  with EF 45%, mild global hypokinesis, mild LVH, normal RV size and systolic function.  Echo (6/14) with EF 20-25%, diffuse hypokinesis, mild LV dilation. Echo (1/15) with mild LV dilation, EF 25-30%, diffuse hypokinesis, prominent apical trabeculation, mildly dilated RV. CPX (3/15) with VO2 max 20.1, VE/VCO2 slope 34.7 (near normal when adjusted to respiratory compensation point) => this CPX showed normal functional capacity for his age.  - Echo (4/19): EF 25-30%, mild MR.  - RHC/LHC (4/19): No significant CAD.  Mean RA 6, PA 46/18 mean 32, mean PCWP 21, CI 2.01, PVR 2.4 WU (pulmonary venous hypertension).  - Echo (2/20): EF 30-35%, moderate diastolic dysfunction, normal RV size and systolic function, moderate LAE.  4. Hyperlipidemia   Family History:  Mother died with CHF at 109.   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  Quit smoking in 1996.  Occasional ETOH  Married with 3 children. Wife is now in dementia unit at [facility].    ROS: All systems reviewed and negative except as per HPI.   Current Outpatient Medications  Medication Sig Dispense Refill  . aspirin 81 MG tablet Take 81 mg by mouth daily.    . carvedilol (COREG) 25 MG tablet Take 1 tablet (25 mg total) by mouth 2 (two) times daily. Please make annual appt for future refills. [phone]. Thank you 60 tablet 2  . sacubitril-valsartan (ENTRESTO) 97-103 MG Take 1 tablet by mouth 2 (two) times daily. [place]  tablet 6  . simvastatin (ZOCOR) 40 MG tablet Take 40 mg by mouth daily at 6 PM.     . spironolactone (ALDACTONE) 25 MG tablet TAKE 1 TABLET BY MOUTH EVERY DAY 30 tablet 6  . isosorbide-hydrALAZINE (BIDIL) 20-37.5 MG tablet Take 1 tablet by mouth 3 (three) times daily. 90 tablet 3   No current facility-administered medications for this encounter.     BP 120/80   Pulse 67   Wt 115 kg (253 lb 9.6 oz)   SpO2 99%    BMI 38.00 kg/m  General: NAD Neck: No JVD, no thyromegaly or thyroid nodule.  Lungs: Clear to auscultation bilaterally with normal respiratory effort. CV: Nondisplaced PMI.  Heart regular S1/S2, no S3/S4, no murmur.  1+ ankle edema.  No carotid bruit.  Normal pedal pulses.  Abdomen: Soft, nontender, no hepatosplenomegaly, no distention.  Skin: Intact without lesions or rashes.  Neurologic: Alert and oriented x 3.  Psych: Normal affect. Extremities: No clubbing or cyanosis.  HEENT: Normal.   Assessment/Plan: 1. Nonischemic cardiomyopathy: Possibly due to viral myocarditis versus HTN.  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 Last echo in 4/19 showed EF 25-30%, stable compared to the past.  No CAD on 4/19 cath, but CI marginal at 2.01 with mildly elevated PCWP. NYHA class II symptoms.  Not volume overloaded on exam.   - He did not get an ICD in the past with minimal symptoms and nonischemic etiology. He is not interested in an ICD at this point, would not push him given no CAD unless he is proven to have ventricular arrhythmias.   - Continue Coreg 25 mg bid.  - Continue spironolactone 25 daily.  - Continue Entresto 97/103 bid.  - Add Bidil 1 tab tid.   - BMET today.  2. HTN: BP controlled.  3. Hyperlipidemia: Check lipids today.   Followup in 3 months.    Loralie Champagne 01/17/2019

## 2019-01-17 NOTE — Patient Instructions (Signed)
Start Bidil 1 tab Three times a day, we have sent this prescription to your pharmacy, if the copay amount is too expensive please let us know  Labs done today, we will let you know if anything is abnormal  Your physician recommends that you schedule a follow-up appointment in: 3 months

## 2019-01-17 NOTE — Progress Notes (Signed)
  Echocardiogram 2D Echocardiogram has been performed.  Randa Lynn Walker [place] 01/17/2019, 12:22 PM

## 2019-02-04 DIAGNOSIS — N390 Urinary tract infection, site not specified: Secondary | ICD-10-CM | POA: Diagnosis not present

## 2019-02-04 DIAGNOSIS — R300 Dysuria: Secondary | ICD-10-CM | POA: Diagnosis not present

## 2019-02-16 ENCOUNTER — Other Ambulatory Visit: Payer: Self-pay | Admitting: Physician Assistant

## 2019-02-16 DIAGNOSIS — I428 Other cardiomyopathies: Secondary | ICD-10-CM

## 2019-03-14 ENCOUNTER — Emergency Department (HOSPITAL_COMMUNITY)
Admission: EM | Admit: 2019-03-14 | Discharge: 2019-03-14 | Disposition: A | Discharge status: Home / Self Care | Payer: PPO | Attending: Emergency Medicine | Admitting: Emergency Medicine

## 2019-03-14 ENCOUNTER — Other Ambulatory Visit: Payer: Self-pay

## 2019-03-14 DIAGNOSIS — R5381 Other malaise: Secondary | ICD-10-CM | POA: Diagnosis not present

## 2019-03-14 DIAGNOSIS — R040 Epistaxis: Secondary | ICD-10-CM

## 2019-03-14 DIAGNOSIS — I110 Hypertensive heart disease with heart failure: Secondary | ICD-10-CM | POA: Diagnosis not present

## 2019-03-14 DIAGNOSIS — Z79899 Other long term (current) drug therapy: Secondary | ICD-10-CM | POA: Insufficient documentation

## 2019-03-14 DIAGNOSIS — Z87891 Personal history of nicotine dependence: Secondary | ICD-10-CM | POA: Diagnosis not present

## 2019-03-14 DIAGNOSIS — Z7982 Long term (current) use of aspirin: Secondary | ICD-10-CM | POA: Diagnosis not present

## 2019-03-14 DIAGNOSIS — Z041 Encounter for examination and observation following transport accident: Secondary | ICD-10-CM | POA: Diagnosis not present

## 2019-03-14 DIAGNOSIS — I5022 Chronic systolic (congestive) heart failure: Secondary | ICD-10-CM | POA: Diagnosis not present
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
## 2019-03-14 NOTE — ED Notes (Signed)
Patient verbalizes understanding of discharge instructions. Opportunity for questioning and answering were provided.  patient discharged from ED.  

## 2019-03-18 ENCOUNTER — Other Ambulatory Visit: Payer: Self-pay | Admitting: Physician Assistant

## 2019-03-18 DIAGNOSIS — I428 Other cardiomyopathies: Secondary | ICD-10-CM

## 2019-04-08 ENCOUNTER — Other Ambulatory Visit: Payer: Self-pay | Admitting: Physician Assistant

## 2019-04-08 DIAGNOSIS — I428 Other cardiomyopathies: Secondary | ICD-10-CM

## 2019-04-18 ENCOUNTER — Encounter (HOSPITAL_COMMUNITY): Payer: PPO | Admitting: Cardiology

## 2019-04-21 ENCOUNTER — Ambulatory Visit (INDEPENDENT_AMBULATORY_CARE_PROVIDER_SITE_OTHER): Payer: PPO

## 2019-04-21 ENCOUNTER — Encounter (HOSPITAL_COMMUNITY): Payer: Self-pay

## 2019-04-21 ENCOUNTER — Other Ambulatory Visit: Payer: Self-pay

## 2019-04-21 ENCOUNTER — Ambulatory Visit (HOSPITAL_COMMUNITY)
Admission: EM | Admit: 2019-04-21 | Discharge: 2019-04-21 | Disposition: A | Discharge status: Home / Self Care | Payer: PPO | Attending: Family Medicine | Admitting: Family Medicine

## 2019-04-21 DIAGNOSIS — L720 Epidermal cyst: Secondary | ICD-10-CM | POA: Diagnosis not present

## 2019-04-21 DIAGNOSIS — M25571 Pain in right ankle and joints of right foot: Secondary | ICD-10-CM

## 2019-04-21 DIAGNOSIS — S99911A Unspecified injury of right ankle, initial encounter: Secondary | ICD-10-CM | POA: Diagnosis not present

## 2019-04-21 MED ORDER — METHYLPREDNISOLONE 4 MG PO TBPK: ORAL_TABLET | ORAL | 0 refills | Status: DC

## 2019-04-21 MED ORDER — HYDROCODONE-ACETAMINOPHEN 5-325 MG PO TABS: 1.0000 | ORAL_TABLET | ORAL | 0 refills | Status: DC | PRN
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
## 2019-04-21 NOTE — Discharge Instructions (Signed)
Limit walking Elevate leg to reduce swelling Take the prednisone as directed Take pain medicine if needed.  Do not drive on pain medicine Call your doctor if not better in 2-3 days
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
## 2019-05-02 DIAGNOSIS — R531 Weakness: Secondary | ICD-10-CM | POA: Diagnosis not present

## 2019-05-02 DIAGNOSIS — W19XXXA Unspecified fall, initial encounter: Secondary | ICD-10-CM | POA: Diagnosis not present

## 2019-05-02 DIAGNOSIS — I959 Hypotension, unspecified: Secondary | ICD-10-CM | POA: Diagnosis not present

## 2019-05-17 ENCOUNTER — Other Ambulatory Visit (HOSPITAL_COMMUNITY): Payer: Self-pay | Admitting: Cardiology

## 2019-05-20 DIAGNOSIS — E7849 Other hyperlipidemia: Secondary | ICD-10-CM | POA: Diagnosis not present

## 2019-05-20 DIAGNOSIS — R739 Hyperglycemia, unspecified: Secondary | ICD-10-CM | POA: Diagnosis not present

## 2019-05-20 DIAGNOSIS — Z125 Encounter for screening for malignant neoplasm of prostate: Secondary | ICD-10-CM | POA: Diagnosis not present

## 2019-05-20 DIAGNOSIS — R82998 Other abnormal findings in urine: Secondary | ICD-10-CM | POA: Diagnosis not present

## 2019-05-20 DIAGNOSIS — I10 Essential (primary) hypertension: Secondary | ICD-10-CM | POA: Diagnosis not present

## 2019-05-21 DIAGNOSIS — R972 Elevated prostate specific antigen [PSA]: Secondary | ICD-10-CM | POA: Diagnosis not present

## 2019-05-21 DIAGNOSIS — D649 Anemia, unspecified: Secondary | ICD-10-CM | POA: Diagnosis not present

## 2019-05-21 DIAGNOSIS — Z0000 Encounter for general adult medical examination without abnormal findings: Secondary | ICD-10-CM | POA: Diagnosis not present

## 2019-05-21 DIAGNOSIS — E7849 Other hyperlipidemia: Secondary | ICD-10-CM | POA: Diagnosis not present

## 2019-05-21 DIAGNOSIS — I10 Essential (primary) hypertension: Secondary | ICD-10-CM | POA: Diagnosis not present

## 2019-05-27 DIAGNOSIS — I5022 Chronic systolic (congestive) heart failure: Secondary | ICD-10-CM | POA: Diagnosis not present

## 2019-05-27 DIAGNOSIS — M545 Low back pain: Secondary | ICD-10-CM | POA: Diagnosis not present

## 2019-05-27 DIAGNOSIS — L723 Sebaceous cyst: Secondary | ICD-10-CM | POA: Diagnosis not present

## 2019-05-27 DIAGNOSIS — R739 Hyperglycemia, unspecified: Secondary | ICD-10-CM | POA: Diagnosis not present

## 2019-05-27 DIAGNOSIS — R972 Elevated prostate specific antigen [PSA]: Secondary | ICD-10-CM | POA: Diagnosis not present

## 2019-05-27 DIAGNOSIS — I110 Hypertensive heart disease with heart failure: Secondary | ICD-10-CM | POA: Diagnosis not present

## 2019-05-27 DIAGNOSIS — I271 Kyphoscoliotic heart disease: Secondary | ICD-10-CM | POA: Diagnosis not present

## 2019-05-27 DIAGNOSIS — I501 Left ventricular failure: Secondary | ICD-10-CM | POA: Diagnosis not present

## 2019-05-27 DIAGNOSIS — Z0000 Encounter for general adult medical examination without abnormal findings: Secondary | ICD-10-CM | POA: Diagnosis not present

## 2019-05-27 DIAGNOSIS — D649 Anemia, unspecified: Secondary | ICD-10-CM | POA: Diagnosis not present

## 2019-05-27 DIAGNOSIS — I429 Cardiomyopathy, unspecified: Secondary | ICD-10-CM | POA: Diagnosis not present

## 2019-05-30 ENCOUNTER — Other Ambulatory Visit: Payer: Self-pay | Admitting: Surgery

## 2019-05-30 DIAGNOSIS — L723 Sebaceous cyst: Secondary | ICD-10-CM | POA: Diagnosis not present

## 2019-05-30 DIAGNOSIS — L089 Local infection of the skin and subcutaneous tissue, unspecified: Secondary | ICD-10-CM | POA: Diagnosis not present

## 2019-06-04 ENCOUNTER — Telehealth: Payer: Self-pay | Admitting: *Deleted

## 2019-06-04 NOTE — Telephone Encounter (Signed)
   [facility] Pre-operative Risk Assessment    Request for surgical clearance:  1. What type of surgery is being performed? EXCISION OF INFECTED SEBACEOUS CYST ON BACK  2. When is this surgery scheduled? TBD  3. What type of clearance is required (medical clearance vs. Pharmacy clearance to hold med vs. Both)? MEDICAL  4. Are there any medications that need to be held prior to surgery and how long? ASA   5. Practice name and name of physician performing surgery? [facility]; DR. Coralie Keens  6. What is your office phone number [phone]   7.   What is your office fax number [phone]  8.   Anesthesia type (None, local, MAC, general) ? MAC   Julaine Hua 06/04/2019, 6:11 PM  _________________________________________________________________   (provider comments below)

## 2019-06-05 DIAGNOSIS — H2513 Age-related nuclear cataract, bilateral: Secondary | ICD-10-CM | POA: Diagnosis not present

## 2019-06-05 NOTE — Telephone Encounter (Signed)
Left message for the patient to call back and speak to the on call preop APP. If no new CHF symptom, he will be cleared to proceed with this low risk surgery.
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
## 2019-06-14 ENCOUNTER — Telehealth (HOSPITAL_COMMUNITY): Payer: Self-pay

## 2019-06-14 NOTE — Telephone Encounter (Signed)
Clearance for faxed to [place]. Confirmation received

## 2019-06-19 DIAGNOSIS — D649 Anemia, unspecified: Secondary | ICD-10-CM | POA: Diagnosis not present

## 2019-07-02 ENCOUNTER — Ambulatory Visit: Payer: PPO | Admitting: Internal Medicine

## 2019-07-08 DIAGNOSIS — N200 Calculus of kidney: Secondary | ICD-10-CM | POA: Diagnosis not present

## 2019-07-08 DIAGNOSIS — R972 Elevated prostate specific antigen [PSA]: Secondary | ICD-10-CM | POA: Diagnosis not present

## 2019-07-08 DIAGNOSIS — R8271 Bacteriuria: Secondary | ICD-10-CM | POA: Diagnosis not present

## 2019-07-08 DIAGNOSIS — N281 Cyst of kidney, acquired: Secondary | ICD-10-CM | POA: Diagnosis not present

## 2019-07-14 ENCOUNTER — Other Ambulatory Visit: Payer: Self-pay | Admitting: Physician Assistant

## 2019-07-14 DIAGNOSIS — I428 Other cardiomyopathies: Secondary | ICD-10-CM

## 2019-07-24 ENCOUNTER — Inpatient Hospital Stay (HOSPITAL_COMMUNITY): Admission: RE | Admit: 2019-07-24 | Payer: PPO | Source: Ambulatory Visit

## 2019-07-24 ENCOUNTER — Telehealth (HOSPITAL_COMMUNITY): Payer: Self-pay | Admitting: Pharmacy Technician

## 2019-07-24 NOTE — Telephone Encounter (Signed)
Received notification from [place] that prior authorization for Entresto 97-103mg   is required.  PA submitted on CoverMyMeds Key [id number] Status is pending  Will continue to follow.  Charlann Boxer, CPhT

## 2019-07-24 NOTE — Telephone Encounter (Signed)
Advanced Heart Failure Patient Advocate Encounter  Prior Authorization for Delene Loll 97-103mg  has been approved.    PA# [id number] Effective dates: 07/24/2019 through 07/23/2020  Patients co-pay is $47.00  Called patient to make him aware of approval and to be sure he could afford his co-pay. Called [facility] [phone] and confirmed they got a paid claim and would get the medication ready for him to pick up with his other medications.  Charlann Boxer, CPhT

## 2019-07-25 ENCOUNTER — Other Ambulatory Visit (HOSPITAL_COMMUNITY): Payer: PPO

## 2019-07-26 ENCOUNTER — Other Ambulatory Visit (HOSPITAL_COMMUNITY)
Admission: RE | Admit: 2019-07-26 | Discharge: 2019-07-26 | Disposition: A | Discharge status: Home / Self Care | Payer: PPO | Source: Ambulatory Visit | Attending: Surgery | Admitting: Surgery

## 2019-07-26 ENCOUNTER — Encounter (HOSPITAL_COMMUNITY): Payer: Self-pay

## 2019-07-26 ENCOUNTER — Encounter (HOSPITAL_COMMUNITY)
Admission: RE | Admit: 2019-07-26 | Discharge: 2019-07-26 | Disposition: A | Discharge status: Home / Self Care | Payer: PPO | Source: Ambulatory Visit | Attending: Surgery | Admitting: Surgery

## 2019-07-26 ENCOUNTER — Other Ambulatory Visit: Payer: Self-pay

## 2019-07-26 DIAGNOSIS — Z87891 Personal history of nicotine dependence: Secondary | ICD-10-CM | POA: Insufficient documentation

## 2019-07-26 DIAGNOSIS — E669 Obesity, unspecified: Secondary | ICD-10-CM | POA: Insufficient documentation

## 2019-07-26 DIAGNOSIS — Z79899 Other long term (current) drug therapy: Secondary | ICD-10-CM | POA: Insufficient documentation

## 2019-07-26 DIAGNOSIS — I428 Other cardiomyopathies: Secondary | ICD-10-CM | POA: Insufficient documentation

## 2019-07-26 DIAGNOSIS — I5022 Chronic systolic (congestive) heart failure: Secondary | ICD-10-CM | POA: Insufficient documentation

## 2019-07-26 DIAGNOSIS — L723 Sebaceous cyst: Secondary | ICD-10-CM | POA: Diagnosis present

## 2019-07-26 DIAGNOSIS — E782 Mixed hyperlipidemia: Secondary | ICD-10-CM | POA: Diagnosis not present

## 2019-07-26 DIAGNOSIS — I110 Hypertensive heart disease with heart failure: Secondary | ICD-10-CM | POA: Insufficient documentation

## 2019-07-26 DIAGNOSIS — E785 Hyperlipidemia, unspecified: Secondary | ICD-10-CM | POA: Insufficient documentation

## 2019-07-26 DIAGNOSIS — Z20828 Contact with and (suspected) exposure to other viral communicable diseases: Secondary | ICD-10-CM | POA: Insufficient documentation

## 2019-07-26 DIAGNOSIS — Z6834 Body mass index (BMI) 34.0-34.9, adult: Secondary | ICD-10-CM | POA: Insufficient documentation

## 2019-07-26 DIAGNOSIS — Z7982 Long term (current) use of aspirin: Secondary | ICD-10-CM | POA: Insufficient documentation

## 2019-07-26 DIAGNOSIS — Z01812 Encounter for preprocedural laboratory examination: Secondary | ICD-10-CM | POA: Insufficient documentation

## 2019-07-26 DIAGNOSIS — L720 Epidermal cyst: Secondary | ICD-10-CM | POA: Diagnosis not present

## 2019-07-26 LAB — CBC
HCT: 40.6 % (ref 39.0–52.0)
Hemoglobin: 12.9 g/dL — ABNORMAL LOW (ref 13.0–17.0)
MCH: 30.4 pg (ref 26.0–34.0)
MCHC: 31.8 g/dL (ref 30.0–36.0)
MCV: 95.8 fL (ref 80.0–100.0)
Platelets: 220 10*3/uL (ref 150–400)
RBC: 4.24 MIL/uL (ref 4.22–5.81)
RDW: 13.7 % (ref 11.5–15.5)
WBC: 5.5 10*3/uL (ref 4.0–10.5)
nRBC: 0 % (ref 0.0–0.2)

## 2019-07-26 LAB — BASIC METABOLIC PANEL
Anion gap: 10 (ref 5–15)
BUN: 14 mg/dL (ref 8–23)
CO2: 22 mmol/L (ref 22–32)
Calcium: 9.3 mg/dL (ref 8.9–10.3)
Chloride: 107 mmol/L (ref 98–111)
Creatinine, Ser: 1.28 mg/dL — ABNORMAL HIGH (ref 0.61–1.24)
GFR calc Af Amer: >60 mL/min (ref >60)
GFR calc non Af Amer: 55 mL/min — ABNORMAL LOW (ref >60)
Glucose, Bld: 89 mg/dL (ref 70–99)
Potassium: 3.7 mmol/L (ref 3.5–5.1)
Sodium: 139 mmol/L (ref 135–145)

## 2019-07-26 LAB — SARS CORONAVIRUS 2 (TAT 6-24 HRS): SARS Coronavirus 2: NEGATIVE

## 2019-07-26 NOTE — Progress Notes (Signed)
[id number] [address], [address] [place] [address] Phone: [phone] Fax: [phone]  [place], [place] [address] [place] [address] Phone: [phone] Fax: [phone]    Your procedure is scheduled on Monday, August 31st.  Report to [facility] Main Entrance "A" at 5:30 A.M., and check in at the Admitting office.  Call this number if you have problems the morning of surgery:  [phone]  Call [phone] if you have any questions prior to your surgery date Monday-Friday 8am-4pm   Remember:  Do not eat after midnight the night before your surgery  You may drink clear liquids until 4:30 the morning of your surgery.   Clear liquids allowed are: Water, Non-Citrus Juices (without pulp), Carbonated Beverages, Clear Tea, Black Coffee Only, and Gatorade    Take these medicines the morning of surgery with A SIP OF WATER  carvedilol (COREG)  isosorbide-hydrALAZINE   Follow your surgeon's instructions on when to stop Aspirin.  If no instructions were given by your surgeon then you will need to call the office to get those instructions.    As of today, STOP taking any Aspirin (unless otherwise instructed by your surgeon), Aleve, Naproxen, Ibuprofen, Motrin, Advil, Goody's, BC's, all herbal medications, fish oil, and all vitamins.  The Morning of Surgery  Do not wear jewelry, make-up or nail polish.  Do not wear lotions, powders, or perfumes/colognes, or deodorant  Do not shave 48 hours prior to surgery.  Men may shave face and neck.  Do not bring valuables to the hospital.  [facility] is not responsible for any belongings or valuables.  If you are a smoker, DO NOT Smoke 24 hours prior to surgery IF you wear a CPAP at night please bring your mask, tubing, and machine the morning of surgery   Remember that you must have someone to transport you home after your  surgery, and remain with you for 24 hours if you are discharged the same day.  Contacts, glasses, hearing aids, dentures or bridgework may not be worn into surgery.   Leave your suitcase in the car.  After surgery it may be brought to your room.  For patients admitted to the hospital, discharge time will be determined by your treatment team.  Patients discharged the day of surgery will not be allowed to drive home.   Special instructions:   [facility]- Preparing For Surgery  Before surgery, you can play an important role. Because skin is not sterile, your skin needs to be as free of germs as possible. You can reduce the number of germs on your skin by washing with CHG (chlorahexidine gluconate) Soap before surgery.  CHG is an antiseptic cleaner which kills germs and bonds with the skin to continue killing germs even after washing.    Oral Hygiene is also important to reduce your risk of infection.  Remember - BRUSH YOUR TEETH THE MORNING OF SURGERY WITH YOUR REGULAR TOOTHPASTE  Please do not use if you have an allergy to CHG or antibacterial soaps. If your skin becomes reddened/irritated stop using the CHG.  Do not shave (including legs and underarms) for at least 48 hours prior to first CHG shower. It is OK to shave your face.  Please follow these instructions carefully.   1. Shower the NIGHT BEFORE SURGERY and the MORNING OF SURGERY with CHG Soap.   2. If you chose to wash your hair, wash  your hair first as usual with your normal shampoo.  3. After you shampoo, rinse your hair and body thoroughly to remove the shampoo.  4. Use CHG as you would any other liquid soap. You can apply CHG directly to the skin and wash gently with a scrungie or a clean washcloth.   5. Apply the CHG Soap to your body ONLY FROM THE NECK DOWN.  Do not use on open wounds or open sores. Avoid contact with your eyes, ears, mouth and genitals (private parts). Wash Face and genitals (private parts)  with your  normal soap.   6. Wash thoroughly, paying special attention to the area where your surgery will be performed.  7. Thoroughly rinse your body with warm water from the neck down.  8. DO NOT shower/wash with your normal soap after using and rinsing off the CHG Soap.  9. Pat yourself dry with a CLEAN TOWEL.  10. Wear CLEAN PAJAMAS to bed the night before surgery, wear comfortable clothes the morning of surgery  11. Place CLEAN SHEETS on your bed the night of your first shower and DO NOT SLEEP WITH PETS.  Day of Surgery: Do not apply any deodorants/lotions. Please shower the morning of surgery with the CHG soap  Please wear clean clothes to the hospital/surgery center.   Remember to brush your teeth WITH YOUR REGULAR TOOTHPASTE.  Please read over the following fact sheets that you were given.
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
## 2019-07-26 NOTE — Progress Notes (Signed)
PCP - Dr. Shon Baton Cardiologist - Dr. Loralie Champagne  Chest x-ray - N/A  EKG - 01/17/2019 Stress Test - 02/09/15 ECHO - 01/17/2019 Cardiac Cath - 03/09/18  Sleep Study - denies CPAP - N/A  Blood Thinner Instructions: N/A Aspirin Instructions: LD 8/26  Anesthesia review: YES, cardiac hx, EKG  Coronavirus Screening  Have you experienced the following symptoms:  Cough yes/no: No Fever (>100.85F)  yes/no: No Runny nose yes/no: No Sore throat yes/no: No Difficulty breathing/shortness of breath  yes/no: No  Have you or a family member traveled in the last 14 days and where? yes/no: No  If the patient indicates "YES" to the above questions, their PAT will be rescheduled to limit the exposure to others and, the surgeon will be notified. THE PATIENT WILL NEED TO BE ASYMPTOMATIC FOR 14 DAYS.   If the patient is not experiencing any of these symptoms, the PAT nurse will instruct them to NOT bring anyone with them to their appointment since they may have these symptoms or traveled as well.   Please remind your patients and families that hospital visitation restrictions are in effect and the importance of the restrictions.   Patient denies shortness of breath, fever, cough and chest pain at PAT appointment  Patient verbalized understanding of instructions that were given to them at the PAT appointment. Patient was also instructed that they will need to review over the PAT instructions again at home before surgery.

## 2019-07-28 NOTE — H&P (Signed)
Kevin Murillo  Location: [facility] Surgery Patient #: [id number] DOB: 04-Nov-1946 Widowed / Language: [name] / Race: Black or African American Male   History of Present Illness  The patient is a 73 year old male who presents with a complaint of Skin problems. This gentleman is referred by Dr. Shon Baton for evaluation of an infected sebaceous cyst on his back. The patient reports it is been present several months and will intermittently draining and not heal. He has no discomfort and has not been on antibiotics. He has no previous history of sebaceous cyst. He is otherwise without complaints.  Past Medical History:  Diagnosis Date  . Chronic systolic CHF (congestive heart failure), NYHA class 1 ([place])   . History of echocardiogram    Echo 3/16: Mild LVH, EF 40-45%, anteroseptal akinesis, grade 1 diastolic dysfunction, moderate LAE // Echo 3/19: diff HK worse in basal and mid inf wall, mild LVH, EF 25-30, mild MR, mild LAE  . Hyperlipidemia   . Hypertension   . Neuropathy of hand   . Non-ischemic cardiomyopathy ([place])    a. Echo 6/11: 30-35% // b. LHC 7/11: EF 30-35% with diffuse hypokinesis, no angiographic CAD. SPEP and Fe studies unremarkable, TSH normal.//  c. Echo 1/12: EF 45% // d. Echo 6/14: EF 20-25%  //  e. Echo EF 25-30%, diffuse hypokinesis, prominent apical trabeculation // f. CPX 3/15: normal fxnal capacity  // g. CPX 3/16 low norma fxnal capacity  // h. Echo 3/16: mild LVH, EF 40-45%   . Obesity   . PVC (premature ventricular contraction)   . Rosacea   . Tubular adenoma of colon 12/2010        Patient Active Problem List   Diagnosis Date Noted  . 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 Personal history of colonic adenomas 11/04/2012  . HYPERLIPIDEMIA-MIXED 06/16/2010  . Essential hypertension 05/27/2010  . LEFT HEART FAILURE 05/27/2010         Past Surgical History:  Procedure Laterality Date  . ARM SURGERY  1967   RIGHT   .  COLONOSCOPY    . KNEE ARTHROSCOPY Left   . RIGHT/LEFT HEART CATH AND CORONARY ANGIOGRAPHY N/A 03/09/2018   Procedure: RIGHT/LEFT HEART CATH AND CORONARY ANGIOGRAPHY;  Surgeon: Larey Dresser, MD;  Location: [place] CV LAB;  Service: Cardiovascular    Allergies  No Known Drug Allergies  Allergies Reconciled   Medication History  Entresto (97-103MG  Tablet, Oral) Active. Carvedilol (25MG  Tablet, Oral) Active. Aspirin (81MG  Tablet, Oral) Active. Spironolactone (25MG  Tablet, Oral) Active. Simvastatin (40MG  Tablet, Oral) Active. Medications Reconciled  Vitals  Weight: 243.8 lb Height: 68in Body Surface Area: 2.22 m Body Mass Index: 37.07 kg/m  Temp.: 98.63F  Pulse: 90 (Regular)  BP: 118/66(Sitting, Left Arm, Standard)     Physical Exam   The physical exam findings are as follows: Note: On examination, there is a large sebaceous cyst on his left lower back which is approximately 3-4 cm in size. There's an open area which I was able to express purulence. Lungs clear CV RRR Abdomen soft, non-tender Skin otherwise normal    Assessment & Plan   INFECTED SEBACEOUS CYST (L72.3) Impression: I discussed the diagnosis with him. Excision of this chronically infected space cyst is recommended to prevent ongoing infections. I discussed this with him in detail. We discussed the surgical risks as well as detail. He understands and wishes to proceed with surgery. I will place him on antibiotic preoperatively. We will also need cardiac  clearance. This will be done under a local anesthetic with monitored anesthesia care  Current Plans Started Cefadroxil 500 MG Oral Capsule, 1 (one) Capsule two times daily, 14 days starting 05/30/2019, No Refill.

## 2019-07-29 ENCOUNTER — Ambulatory Visit (HOSPITAL_COMMUNITY)
Admission: RE | Admit: 2019-07-29 | Discharge: 2019-07-29 | Disposition: A | Discharge status: Home / Self Care | Payer: PPO | Attending: Surgery | Admitting: Surgery

## 2019-07-29 ENCOUNTER — Ambulatory Visit (HOSPITAL_COMMUNITY): Payer: PPO | Admitting: Anesthesiology

## 2019-07-29 ENCOUNTER — Ambulatory Visit (HOSPITAL_COMMUNITY): Payer: PPO | Admitting: Vascular Surgery

## 2019-07-29 ENCOUNTER — Encounter (HOSPITAL_COMMUNITY): Payer: Self-pay | Admitting: Surgery

## 2019-07-29 ENCOUNTER — Other Ambulatory Visit: Payer: Self-pay

## 2019-07-29 ENCOUNTER — Encounter (HOSPITAL_COMMUNITY)
Admission: RE | Disposition: A | Discharge status: Home / Self Care | Payer: Self-pay | Source: Home / Self Care | Attending: Surgery

## 2019-07-29 DIAGNOSIS — Z20828 Contact with and (suspected) exposure to other viral communicable diseases: Secondary | ICD-10-CM | POA: Diagnosis not present

## 2019-07-29 DIAGNOSIS — Z6834 Body mass index (BMI) 34.0-34.9, adult: Secondary | ICD-10-CM | POA: Diagnosis not present

## 2019-07-29 DIAGNOSIS — E669 Obesity, unspecified: Secondary | ICD-10-CM | POA: Diagnosis not present

## 2019-07-29 DIAGNOSIS — L720 Epidermal cyst: Secondary | ICD-10-CM | POA: Diagnosis not present

## 2019-07-29 DIAGNOSIS — I110 Hypertensive heart disease with heart failure: Secondary | ICD-10-CM | POA: Diagnosis not present

## 2019-07-29 DIAGNOSIS — I5022 Chronic systolic (congestive) heart failure: Secondary | ICD-10-CM | POA: Insufficient documentation

## 2019-07-29 DIAGNOSIS — I10 Essential (primary) hypertension: Secondary | ICD-10-CM | POA: Diagnosis not present

## 2019-07-29 DIAGNOSIS — Z87891 Personal history of nicotine dependence: Secondary | ICD-10-CM | POA: Diagnosis not present

## 2019-07-29 DIAGNOSIS — Z7982 Long term (current) use of aspirin: Secondary | ICD-10-CM | POA: Insufficient documentation

## 2019-07-29 DIAGNOSIS — I428 Other cardiomyopathies: Secondary | ICD-10-CM | POA: Diagnosis not present

## 2019-07-29 DIAGNOSIS — L723 Sebaceous cyst: Secondary | ICD-10-CM | POA: Diagnosis not present

## 2019-07-29 DIAGNOSIS — I420 Dilated cardiomyopathy: Secondary | ICD-10-CM | POA: Diagnosis not present

## 2019-07-29 DIAGNOSIS — E782 Mixed hyperlipidemia: Secondary | ICD-10-CM | POA: Insufficient documentation

## 2019-07-29 DIAGNOSIS — Z79899 Other long term (current) drug therapy: Secondary | ICD-10-CM | POA: Diagnosis not present

## 2019-07-29 DIAGNOSIS — E785 Hyperlipidemia, unspecified: Secondary | ICD-10-CM | POA: Diagnosis not present

## 2019-07-29 HISTORY — PX: CYST EXCISION: SHX5701

## 2019-07-29 SURGERY — CYST REMOVAL
Anesthesia: General | Site: Back | Laterality: Left

## 2019-07-29 MED ORDER — HYDROCODONE-ACETAMINOPHEN 5-325 MG PO TABS: 1.0000 | ORAL_TABLET | ORAL | 0 refills | Status: DC | PRN

## 2019-07-29 MED ORDER — CHLORHEXIDINE GLUCONATE CLOTH 2 % EX PADS: 6.0000 | MEDICATED_PAD | Freq: Once | CUTANEOUS | Status: DC

## 2019-07-29 MED ORDER — CARVEDILOL 12.5 MG PO TABS
ORAL_TABLET | ORAL | Status: AC
  Filled 2019-07-29: qty 2

## 2019-07-29 MED ORDER — 0.9 % SODIUM CHLORIDE (POUR BTL) OPTIME
TOPICAL | Status: DC | PRN
  Administered 2019-07-29: 08:00:00 1000 mL

## 2019-07-29 MED ORDER — DEXAMETHASONE SODIUM PHOSPHATE 10 MG/ML IJ SOLN
INTRAMUSCULAR | Status: DC | PRN
  Administered 2019-07-29: 5 mg via INTRAVENOUS

## 2019-07-29 MED ORDER — ACETAMINOPHEN 500 MG PO TABS
1000.0000 mg | ORAL_TABLET | ORAL | Status: AC
  Administered 2019-07-29: 06:00:00 1000 mg via ORAL

## 2019-07-29 MED ORDER — ACETAMINOPHEN 500 MG PO TABS
ORAL_TABLET | ORAL | Status: AC
  Administered 2019-07-29: 06:00:00 1000 mg via ORAL
  Filled 2019-07-29: qty 2

## 2019-07-29 MED ORDER — PHENYLEPHRINE HCL (PRESSORS) 10 MG/ML IV SOLN
INTRAVENOUS | Status: DC | PRN
  Administered 2019-07-29 (×5): 80 ug via INTRAVENOUS
  Administered 2019-07-29: 40 ug via INTRAVENOUS
  Administered 2019-07-29: 80 ug via INTRAVENOUS

## 2019-07-29 MED ORDER — LACTATED RINGERS IV SOLN
INTRAVENOUS | Status: DC | PRN
  Administered 2019-07-29: 07:00:00 via INTRAVENOUS

## 2019-07-29 MED ORDER — LIDOCAINE HCL (CARDIAC) PF 100 MG/5ML IV SOSY
PREFILLED_SYRINGE | INTRAVENOUS | Status: DC | PRN
  Administered 2019-07-29: 50 mg via INTRAVENOUS

## 2019-07-29 MED ORDER — ONDANSETRON HCL 4 MG/2ML IJ SOLN
INTRAMUSCULAR | Status: DC | PRN
  Administered 2019-07-29: 4 mg via INTRAVENOUS

## 2019-07-29 MED ORDER — OXYCODONE HCL 5 MG PO TABS: 5.0000 mg | ORAL_TABLET | Freq: Once | ORAL | Status: DC | PRN

## 2019-07-29 MED ORDER — OXYCODONE HCL 5 MG/5ML PO SOLN: 5.0000 mg | Freq: Once | ORAL | Status: DC | PRN

## 2019-07-29 MED ORDER — CARVEDILOL 12.5 MG PO TABS
25.0000 mg | ORAL_TABLET | Freq: Once | ORAL | Status: AC
  Administered 2019-07-29: 25 mg via ORAL

## 2019-07-29 MED ORDER — BUPIVACAINE-EPINEPHRINE 0.25% -1:200000 IJ SOLN
INTRAMUSCULAR | Status: DC | PRN
  Administered 2019-07-29: 15 mL

## 2019-07-29 MED ORDER — EPHEDRINE SULFATE 50 MG/ML IJ SOLN
INTRAMUSCULAR | Status: DC | PRN
  Administered 2019-07-29 (×5): 10 mg via INTRAVENOUS

## 2019-07-29 MED ORDER — FENTANYL CITRATE (PF) 100 MCG/2ML IJ SOLN: 25.0000 ug | INTRAMUSCULAR | Status: DC | PRN

## 2019-07-29 MED ORDER — GABAPENTIN 300 MG PO CAPS
300.0000 mg | ORAL_CAPSULE | ORAL | Status: AC
  Administered 2019-07-29: 06:00:00 300 mg via ORAL

## 2019-07-29 MED ORDER — PROPOFOL 10 MG/ML IV BOLUS
INTRAVENOUS | Status: DC | PRN
  Administered 2019-07-29: 100 mg via INTRAVENOUS

## 2019-07-29 MED ORDER — ONDANSETRON HCL 4 MG/2ML IJ SOLN: 4.0000 mg | Freq: Once | INTRAMUSCULAR | Status: DC | PRN

## 2019-07-29 MED ORDER — FENTANYL CITRATE (PF) 250 MCG/5ML IJ SOLN
INTRAMUSCULAR | Status: DC | PRN
  Administered 2019-07-29: 75 ug via INTRAVENOUS

## 2019-07-29 MED ORDER — FENTANYL CITRATE (PF) 250 MCG/5ML IJ SOLN
INTRAMUSCULAR | Status: AC
  Filled 2019-07-29: qty 5

## 2019-07-29 MED ORDER — BUPIVACAINE-EPINEPHRINE (PF) 0.25% -1:200000 IJ SOLN
INTRAMUSCULAR | Status: AC
  Filled 2019-07-29: qty 30

## 2019-07-29 MED ORDER — GABAPENTIN 300 MG PO CAPS
ORAL_CAPSULE | ORAL | Status: AC
  Administered 2019-07-29: 300 mg via ORAL
  Filled 2019-07-29: qty 1

## 2019-07-29 MED ORDER — CEFAZOLIN SODIUM-DEXTROSE 2-4 GM/100ML-% IV SOLN
2.0000 g | INTRAVENOUS | Status: AC
  Administered 2019-07-29: 2 g via INTRAVENOUS

## 2019-07-29 MED ORDER — CEFAZOLIN SODIUM-DEXTROSE 2-4 GM/100ML-% IV SOLN
INTRAVENOUS | Status: AC
  Filled 2019-07-29: qty 100

## 2019-07-29 MED ORDER — PROPOFOL 10 MG/ML IV BOLUS
INTRAVENOUS | Status: AC
  Filled 2019-07-29: qty 40

## 2019-07-29 SURGICAL SUPPLY — 36 items
CANISTER SUCT 3000ML PPV (MISCELLANEOUS) ×2 IMPLANT
COVER SURGICAL LIGHT HANDLE (MISCELLANEOUS) ×3 IMPLANT
COVER WAND RF STERILE (DRAPES) ×3 IMPLANT
DECANTER SPIKE VIAL GLASS SM (MISCELLANEOUS) ×3 IMPLANT
DERMABOND ADVANCED (GAUZE/BANDAGES/DRESSINGS) ×2
DERMABOND ADVANCED .7 DNX12 (GAUZE/BANDAGES/DRESSINGS) ×1 IMPLANT
DRAPE LAPAROSCOPIC ABDOMINAL (DRAPES) IMPLANT
DRAPE LAPAROTOMY 100X72 PEDS (DRAPES) ×2 IMPLANT
ELECT REM PT RETURN 9FT ADLT (ELECTROSURGICAL) ×3
ELECTRODE REM PT RTRN 9FT ADLT (ELECTROSURGICAL) ×1 IMPLANT
GAUZE SPONGE 4X4 12PLY STRL (GAUZE/BANDAGES/DRESSINGS) ×1 IMPLANT
GAUZE SPONGE 4X4 12PLY STRL LF (GAUZE/BANDAGES/DRESSINGS) ×2 IMPLANT
GLOVE SURG SIGNA 7.5 PF LTX (GLOVE) ×3 IMPLANT
GOWN STRL REUS W/ TWL LRG LVL3 (GOWN DISPOSABLE) ×1 IMPLANT
GOWN STRL REUS W/ TWL XL LVL3 (GOWN DISPOSABLE) ×1 IMPLANT
GOWN STRL REUS W/TWL LRG LVL3 (GOWN DISPOSABLE) ×4
GOWN STRL REUS W/TWL XL LVL3 (GOWN DISPOSABLE) ×2
KIT BASIN OR (CUSTOM PROCEDURE TRAY) ×3 IMPLANT
KIT TURNOVER KIT B (KITS) ×3 IMPLANT
NDL HYPO 25GX1X1/2 BEV (NEEDLE) ×1 IMPLANT
NEEDLE HYPO 25GX1X1/2 BEV (NEEDLE) ×3 IMPLANT
NS IRRIG 1000ML POUR BTL (IV SOLUTION) ×3 IMPLANT
PACK GENERAL/GYN (CUSTOM PROCEDURE TRAY) ×3 IMPLANT
PAD ARMBOARD 7.5X6 YLW CONV (MISCELLANEOUS) ×3 IMPLANT
PENCIL SMOKE EVACUATOR (MISCELLANEOUS) ×3 IMPLANT
SPECIMEN JAR SMALL (MISCELLANEOUS) ×3 IMPLANT
SUT ETHILON 2 0 FS 18 (SUTURE) ×2 IMPLANT
SUT MNCRL AB 4-0 PS2 18 (SUTURE) ×3 IMPLANT
SUT VIC AB 2-0 SH 27 (SUTURE) ×2
SUT VIC AB 2-0 SH 27X BRD (SUTURE) IMPLANT
SUT VIC AB 3-0 SH 27 (SUTURE) ×2
SUT VIC AB 3-0 SH 27XBRD (SUTURE) ×1 IMPLANT
SYR CONTROL 10ML LL (SYRINGE) ×3 IMPLANT
TAPE CLOTH SURG 4X10 WHT LF (GAUZE/BANDAGES/DRESSINGS) ×2 IMPLANT
TOWEL GREEN STERILE (TOWEL DISPOSABLE) ×3 IMPLANT
TOWEL GREEN STERILE FF (TOWEL DISPOSABLE) ×3 IMPLANT

## 2019-07-29 NOTE — Interval H&P Note (Signed)
History and Physical Interval Note: no change in H and P  07/29/2019 7:06 AM  Kevin Murillo  has presented today for surgery, with the diagnosis of chronic left lower back cyst.  The various methods of treatment have been discussed with the patient and family. After consideration of risks, benefits and other options for treatment, the patient has consented to  Procedure(s): EXCISION OF CHRONIC LEFT LOWER BACK CYST (Left) as a surgical intervention.  The patient's history has been reviewed, patient examined, no change in status, stable for surgery.  I have reviewed the patient's chart and labs.  Questions were answered to the patient's satisfaction.     Coralie Keens

## 2019-07-29 NOTE — Anesthesia Postprocedure Evaluation (Addendum)
Anesthesia Post Note  Patient: KAIRE ALANI  Procedure(s) Performed: EXCISION OF CHRONIC LEFT LOWER BACK CYST (Left Back)     Patient location during evaluation: PACU Anesthesia Type: General Level of consciousness: awake and alert Pain management: pain level controlled Vital Signs Assessment: post-procedure vital signs reviewed and stable Respiratory status: spontaneous breathing, nonlabored ventilation and respiratory function stable Cardiovascular status: blood pressure returned to baseline and stable Postop Assessment: no apparent nausea or vomiting Anesthetic complications: no    Last Vitals:  Vitals:   07/29/19 0815 07/29/19 0830  BP:  106/72  Pulse: (!) 46 (!) 57  Resp: 12 14  Temp:    SpO2: 100% 98%    Last Pain:  Vitals:   07/29/19 0830  TempSrc:   PainSc: 0-No pain                 Lidia Collum

## 2019-07-29 NOTE — Anesthesia Procedure Notes (Signed)
Procedure Name: LMA Insertion Date/Time: 07/29/2019 7:36 AM Performed by: Mariea Clonts, CRNA Pre-anesthesia Checklist: Patient identified, Emergency Drugs available, Suction available and Patient being monitored Patient Re-evaluated:Patient Re-evaluated prior to induction Oxygen Delivery Method: Circle System Utilized Preoxygenation: Pre-oxygenation with 100% oxygen Induction Type: IV induction Ventilation: Mask ventilation without difficulty LMA: LMA inserted LMA Size: 5.0 Number of attempts: 1 Airway Equipment and Method: Bite block Placement Confirmation: positive ETCO2 Tube secured with: Tape Dental Injury: Teeth and Oropharynx as per pre-operative assessment

## 2019-07-29 NOTE — Op Note (Signed)
EXCISION OF CHRONIC LEFT LOWER BACK CYST  Procedure Note  Kevin Murillo 07/29/2019   Pre-op Diagnosis: chronic left lower back cyst     Post-op Diagnosis: same  Procedure(s): EXCISION OF CHRONIC LEFT LOWER BACK CYST (3 CM )  Surgeon(s): Coralie Keens, MD  Anesthesia: Monitor Anesthesia Care  Staff:  Circulator: Candi Leash, RN; Long, Ronie Spies, RN Scrub Person: Sherrett, Madelynn Done  Estimated Blood Loss: Minimal               Specimens: Sent to pathology.  Consistent with a chronically infected sebaceous cyst  Procedure: The patient was brought to the operating room and identified the correct patient.  He was placed upon the operating room table and anesthesia was induced.  He was then placed in the right lateral cubitus position.  His left lower back was then prepped and draped in usual sterile fashion.  I anesthetized the skin around the chronic sebaceous cyst with Marcaine.  I then made elliptical incision with a scalpel and excised the cyst in its entirety.  It was approximately a 3 cm chronically infected sebaceous cyst.  I sent to pathology for evaluation.  I anesthetized the incision further with Marcaine.  I irrigated it with saline.  I then approximated the subcutaneous tissue with 2 separate 2-0 Vicryl sutures and then closed the skin with interrupted 2-0 nylon sutures.  Gauze and tape were then applied.  The patient tolerated the procedure well.  All counts were correct at the end of the procedure.  The patient was then placed back into the supine position, extubated in the operating room, and taken in a stable condition to the recovery room.          Coralie Keens   Date: 07/29/2019  Time: 8:02 AM

## 2019-07-29 NOTE — Discharge Instructions (Signed)
Ok to shower starting tomorrow  Ice pack, tylenol, ibuprofen also for pain  Ok to remove bandage and place a new one daily after showering  No vigorous activity for 2 weeks

## 2019-07-29 NOTE — Transfer of Care (Signed)
Immediate Anesthesia Transfer of Care Note  Patient: Kevin Murillo  Procedure(s) Performed: EXCISION OF CHRONIC LEFT LOWER BACK CYST (Left Back)  Patient Location: PACU  Anesthesia Type:General  Level of Consciousness: awake, alert  and oriented  Airway & Oxygen Therapy: Patient Spontanous Breathing and Patient connected to nasal cannula oxygen  Post-op Assessment: Report given to RN, Post -op Vital signs reviewed and stable and Patient moving all extremities X 4  Post vital signs: Reviewed and stable  Last Vitals:  Vitals Value Taken Time  BP 96/67 07/29/19 0810  Temp    Pulse 51 07/29/19 0813  Resp 12 07/29/19 0813  SpO2 100 % 07/29/19 0813  Vitals shown include unvalidated device data.  Last Pain:  Vitals:   07/29/19 0813  TempSrc:   PainSc: (P) 0-No pain      Patients Stated Pain Goal: 3 ([id number])  Complications: No apparent anesthesia complications

## 2019-07-30 ENCOUNTER — Encounter (HOSPITAL_COMMUNITY): Payer: Self-pay | Admitting: Surgery

## 2019-07-31 ENCOUNTER — Ambulatory Visit (INDEPENDENT_AMBULATORY_CARE_PROVIDER_SITE_OTHER): Payer: PPO | Admitting: Internal Medicine

## 2019-07-31 ENCOUNTER — Encounter: Payer: Self-pay | Admitting: Internal Medicine

## 2019-07-31 ENCOUNTER — Other Ambulatory Visit: Payer: Self-pay

## 2019-07-31 DIAGNOSIS — I420 Dilated cardiomyopathy: Secondary | ICD-10-CM | POA: Diagnosis not present

## 2019-07-31 DIAGNOSIS — Z86010 Personal history of colonic polyps: Secondary | ICD-10-CM | POA: Diagnosis not present

## 2019-07-31 NOTE — Assessment & Plan Note (Signed)
Colon at [facility]

## 2019-07-31 NOTE — H&P (View-Only) (Signed)
Kevin Murillo 73 y.o. 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 All questions answered. The patient agrees to proceed.   CC: Kevin Baton, MD  Subjective:   Chief Complaint: History of colon polyps  HPI Kevin Murillo is here, he has a history of adenomatous colon polyps, he had one adenoma on his last colonoscopy in 2014 and a 5-year recall was recommended.  He has a very slight anemia with a hemoglobin of 12.9 normal MCV and platelets.  He is not having any disturbance in his bowel function.  No signs of bleeding.  Follows with Kevin Murillo and has been followed in cardiology for dilated cardiomyopathy and an EF of 30 to 35% on last echocardiogram.  He has been practicing appropriate precautions during the COVID-19 pandemic.  Unfortunately his wife died earlier this year after a 6+ year nursing home care episode with progressive dementia. No Known Allergies Current Meds  Medication Sig  . aspirin 81 MG tablet Take 81 mg by mouth daily.  . carvedilol (COREG) 25 MG tablet TAKE 1 TABLET BY MOUTH TWICE A DAY (Patient taking differently: Take 25 mg by mouth 2 (two) times daily with a meal. )  . ENTRESTO 97-103 MG TAKE 1 TABLET BY MOUTH TWICE A DAY (Patient taking differently: Take 1 tablet by mouth 2 (two) times daily. )  . HYDROcodone-acetaminophen (NORCO/VICODIN) 5-325 MG tablet Take 1 tablet by mouth every 4 (four) hours as needed.  . isosorbide-hydrALAZINE (BIDIL) 20-37.5 MG tablet Take 1 tablet by mouth 3 (three) times daily.  . Multiple Vitamin (MULTIVITAMIN WITH MINERALS) TABS tablet Take 1 tablet by mouth daily.  . simvastatin (ZOCOR) 40 MG tablet Take 40 mg by mouth daily at 6 PM.   . spironolactone (ALDACTONE) 25 MG tablet TAKE 1 TABLET BY  MOUTH EVERY DAY (Patient taking differently: Take 25 mg by mouth daily. )   Past Medical History:  Diagnosis Date  . Chronic systolic CHF (congestive heart failure), NYHA class 1 ([place])   . History of echocardiogram    Echo 3/16: Mild LVH, EF 40-45%, anteroseptal akinesis, grade 1 diastolic dysfunction, moderate LAE // Echo 3/19: diff HK worse in basal and mid inf wall, mild LVH, EF 25-30, mild MR, mild LAE  . Hyperlipidemia   . Hypertension   . Neuropathy of hand   . Non-ischemic cardiomyopathy ([place])    a. Echo 6/11: 30-35% // b. LHC 7/11: EF 30-35% with diffuse hypokinesis, no angiographic CAD. SPEP and Fe studies unremarkable, TSH normal.//  c. Echo 1/12: EF 45% // d. Echo 6/14: EF 20-25%  //  e. Echo EF 25-30%, diffuse hypokinesis, prominent apical trabeculation // f. CPX 3/15: normal fxnal capacity  // g. CPX 3/16 low norma fxnal capacity  // h. Echo 3/16: mild LVH, EF 40-45%   . Obesity   . PVC (premature ventricular contraction)   . Rosacea   . Tubular adenoma of colon 12/2010   Past Surgical History:  Procedure Laterality Date  . ARM SURGERY  1967   RIGHT   . COLONOSCOPY    . CYST EXCISION Left 07/29/2019   Procedure: EXCISION OF CHRONIC LEFT LOWER BACK CYST;  Surgeon: Coralie Keens, MD;  Location: [place];  Service: General;  Laterality: Left;  . KNEE ARTHROSCOPY Left   .  RIGHT/LEFT HEART CATH AND CORONARY ANGIOGRAPHY N/A 03/09/2018   Procedure: RIGHT/LEFT HEART CATH AND CORONARY ANGIOGRAPHY;  Surgeon: Larey Dresser, MD;  Location: [place] [facility];  Service: Cardiovascular;  Laterality: N/A;   Social History   Social History Narrative   Married, 2 sons   Retired Pensions consultant, referee   1-2 caffeine beverages daily   As of  03/12/2013         family history includes CVA in his mother; Gout in his son; Heart attack in an other family member.   Review of Systems Ambulates w/ cane, stable mild DOE No chest pain As per HPI Having GU evaluation this month  "tests" Had cyst excised from back 8/31 Objective:   Physical Exam BP 104/70 (BP Location: Left Arm, Patient Position: Sitting, Cuff Size: Normal)   Pulse 68 Comment: irregular  Temp 98.4 F (36.9 C)   Ht 5' 7.5" (1.715 m) Comment: height measured without shoes  Wt 254 lb 8 oz (115.4 kg)   BMI 39.27 kg/m  NAD Eyes anicteric Lungs cta Cor NL

## 2019-07-31 NOTE — Progress Notes (Signed)
Kevin Murillo 73 y.o. 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 All questions answered. The patient agrees to proceed.   CC: Shon Baton, MD  Subjective:   Chief Complaint: History of colon polyps  HPI Kevin Murillo is here, he has a history of adenomatous colon polyps, he had one adenoma on his last colonoscopy in 2014 and a 5-year recall was recommended.  He has a very slight anemia with a hemoglobin of 12.9 normal MCV and platelets.  He is not having any disturbance in his bowel function.  No signs of bleeding.  Follows with Dr. Virgina Jock and has been followed in cardiology for dilated cardiomyopathy and an EF of 30 to 35% on last echocardiogram.  He has been practicing appropriate precautions during the COVID-19 pandemic.  Unfortunately his wife died earlier this year after a 6+ year nursing home care episode with progressive dementia. No Known Allergies Current Meds  Medication Sig  . aspirin 81 MG tablet Take 81 mg by mouth daily.  . carvedilol (COREG) 25 MG tablet TAKE 1 TABLET BY MOUTH TWICE A DAY (Patient taking differently: Take 25 mg by mouth 2 (two) times daily with a meal. )  . ENTRESTO 97-103 MG TAKE 1 TABLET BY MOUTH TWICE A DAY (Patient taking differently: Take 1 tablet by mouth 2 (two) times daily. )  . HYDROcodone-acetaminophen (NORCO/VICODIN) 5-325 MG tablet Take 1 tablet by mouth every 4 (four) hours as needed.  . isosorbide-hydrALAZINE (BIDIL) 20-37.5 MG tablet Take 1 tablet by mouth 3 (three) times daily.  . Multiple Vitamin (MULTIVITAMIN WITH MINERALS) TABS tablet Take 1 tablet by mouth daily.  . simvastatin (ZOCOR) 40 MG tablet Take 40 mg by mouth daily at 6 PM.   . spironolactone (ALDACTONE) 25 MG tablet TAKE 1 TABLET BY  MOUTH EVERY DAY (Patient taking differently: Take 25 mg by mouth daily. )   Past Medical History:  Diagnosis Date  . Chronic systolic CHF (congestive heart failure), NYHA class 1 ([place])   . History of echocardiogram    Echo 3/16: Mild LVH, EF 40-45%, anteroseptal akinesis, grade 1 diastolic dysfunction, moderate LAE // Echo 3/19: diff HK worse in basal and mid inf wall, mild LVH, EF 25-30, mild MR, mild LAE  . Hyperlipidemia   . Hypertension   . Neuropathy of hand   . Non-ischemic cardiomyopathy ([place])    a. Echo 6/11: 30-35% // b. LHC 7/11: EF 30-35% with diffuse hypokinesis, no angiographic CAD. SPEP and Fe studies unremarkable, TSH normal.//  c. Echo 1/12: EF 45% // d. Echo 6/14: EF 20-25%  //  e. Echo EF 25-30%, diffuse hypokinesis, prominent apical trabeculation // f. CPX 3/15: normal fxnal capacity  // g. CPX 3/16 low norma fxnal capacity  // h. Echo 3/16: mild LVH, EF 40-45%   . Obesity   . PVC (premature ventricular contraction)   . Rosacea   . Tubular adenoma of colon 12/2010   Past Surgical History:  Procedure Laterality Date  . ARM SURGERY  1967   RIGHT   . COLONOSCOPY    . CYST EXCISION Left 07/29/2019   Procedure: EXCISION OF CHRONIC LEFT LOWER BACK CYST;  Surgeon: Coralie Keens, MD;  Location: [place];  Service: General;  Laterality: Left;  . KNEE ARTHROSCOPY Left   .  RIGHT/LEFT HEART CATH AND CORONARY ANGIOGRAPHY N/A 03/09/2018   Procedure: RIGHT/LEFT HEART CATH AND CORONARY ANGIOGRAPHY;  Surgeon: Larey Dresser, MD;  Location: [place] [facility];  Service: Cardiovascular;  Laterality: N/A;   Social History   Social History Narrative   Married, 2 sons   Retired Pensions consultant, referee   1-2 caffeine beverages daily   As of  03/12/2013         family history includes CVA in his mother; Gout in his son; Heart attack in an other family member.   Review of Systems Ambulates w/ cane, stable mild DOE No chest pain As per HPI Having GU evaluation this month  "tests" Had cyst excised from back 8/31 Objective:   Physical Exam BP 104/70 (BP Location: Left Arm, Patient Position: Sitting, Cuff Size: Normal)   Pulse 68 Comment: irregular  Temp 98.4 F (36.9 C)   Ht 5' 7.5" (1.715 m) Comment: height measured without shoes  Wt 254 lb 8 oz (115.4 kg)   BMI 39.27 kg/m  NAD Eyes anicteric Lungs cta Cor NL

## 2019-07-31 NOTE — Assessment & Plan Note (Signed)
EF30-35% so colonoscopy at hospital

## 2019-07-31 NOTE — Patient Instructions (Signed)
You have been scheduled for a colonoscopy. Please follow written instructions given to you at your visit today.  Please pick up your prep supplies at the pharmacy within the next 1-3 days. If you use inhalers (even only as needed), please bring them with you on the day of your procedure.   I appreciate the opportunity to care for you. Carl Gessner, MD, FACG 

## 2019-08-14 DIAGNOSIS — N281 Cyst of kidney, acquired: Secondary | ICD-10-CM | POA: Diagnosis not present

## 2019-08-14 DIAGNOSIS — R972 Elevated prostate specific antigen [PSA]: Secondary | ICD-10-CM | POA: Diagnosis not present

## 2019-08-19 DIAGNOSIS — N281 Cyst of kidney, acquired: Secondary | ICD-10-CM | POA: Diagnosis not present

## 2019-08-19 DIAGNOSIS — N200 Calculus of kidney: Secondary | ICD-10-CM | POA: Diagnosis not present

## 2019-08-20 DIAGNOSIS — R972 Elevated prostate specific antigen [PSA]: Secondary | ICD-10-CM | POA: Diagnosis not present

## 2019-08-20 DIAGNOSIS — N200 Calculus of kidney: Secondary | ICD-10-CM | POA: Diagnosis not present

## 2019-08-20 DIAGNOSIS — N281 Cyst of kidney, acquired: Secondary | ICD-10-CM | POA: Diagnosis not present

## 2019-08-20 DIAGNOSIS — R8271 Bacteriuria: Secondary | ICD-10-CM | POA: Diagnosis not present

## 2019-08-24 ENCOUNTER — Other Ambulatory Visit (HOSPITAL_COMMUNITY)
Admission: RE | Admit: 2019-08-24 | Discharge: 2019-08-24 | Disposition: A | Discharge status: Home / Self Care | Payer: PPO | Source: Ambulatory Visit | Attending: Internal Medicine | Admitting: Internal Medicine

## 2019-08-24 DIAGNOSIS — Z01812 Encounter for preprocedural laboratory examination: Secondary | ICD-10-CM | POA: Diagnosis not present

## 2019-08-24 DIAGNOSIS — Z20828 Contact with and (suspected) exposure to other viral communicable diseases: Secondary | ICD-10-CM | POA: Diagnosis not present

## 2019-08-25 LAB — NOVEL CORONAVIRUS, NAA (HOSP ORDER, SEND-OUT TO REF LAB; TAT 18-24 HRS): SARS-CoV-2, NAA: NOT DETECTED

## 2019-08-26 ENCOUNTER — Encounter (HOSPITAL_COMMUNITY): Payer: Self-pay | Admitting: *Deleted

## 2019-08-28 ENCOUNTER — Ambulatory Visit (HOSPITAL_COMMUNITY): Payer: PPO | Admitting: Anesthesiology

## 2019-08-28 ENCOUNTER — Other Ambulatory Visit: Payer: Self-pay

## 2019-08-28 ENCOUNTER — Encounter (HOSPITAL_COMMUNITY): Payer: Self-pay

## 2019-08-28 ENCOUNTER — Encounter (HOSPITAL_COMMUNITY)
Admission: RE | Disposition: A | Discharge status: Home / Self Care | Payer: Self-pay | Source: Home / Self Care | Attending: Internal Medicine

## 2019-08-28 ENCOUNTER — Ambulatory Visit (HOSPITAL_COMMUNITY)
Admission: RE | Admit: 2019-08-28 | Discharge: 2019-08-28 | Disposition: A | Discharge status: Home / Self Care | Payer: PPO | Attending: Internal Medicine | Admitting: Internal Medicine

## 2019-08-28 DIAGNOSIS — E669 Obesity, unspecified: Secondary | ICD-10-CM | POA: Diagnosis not present

## 2019-08-28 DIAGNOSIS — I5022 Chronic systolic (congestive) heart failure: Secondary | ICD-10-CM | POA: Diagnosis not present

## 2019-08-28 DIAGNOSIS — E785 Hyperlipidemia, unspecified: Secondary | ICD-10-CM | POA: Diagnosis not present

## 2019-08-28 DIAGNOSIS — Z7982 Long term (current) use of aspirin: Secondary | ICD-10-CM | POA: Diagnosis not present

## 2019-08-28 DIAGNOSIS — Z6837 Body mass index (BMI) 37.0-37.9, adult: Secondary | ICD-10-CM | POA: Diagnosis not present

## 2019-08-28 DIAGNOSIS — D649 Anemia, unspecified: Secondary | ICD-10-CM | POA: Diagnosis not present

## 2019-08-28 DIAGNOSIS — Z1211 Encounter for screening for malignant neoplasm of colon: Secondary | ICD-10-CM | POA: Insufficient documentation

## 2019-08-28 DIAGNOSIS — Z87891 Personal history of nicotine dependence: Secondary | ICD-10-CM | POA: Insufficient documentation

## 2019-08-28 DIAGNOSIS — I420 Dilated cardiomyopathy: Secondary | ICD-10-CM | POA: Insufficient documentation

## 2019-08-28 DIAGNOSIS — Z86010 Personal history of colonic polyps: Secondary | ICD-10-CM | POA: Diagnosis not present

## 2019-08-28 DIAGNOSIS — I110 Hypertensive heart disease with heart failure: Secondary | ICD-10-CM | POA: Insufficient documentation

## 2019-08-28 DIAGNOSIS — Z85038 Personal history of other malignant neoplasm of large intestine: Secondary | ICD-10-CM | POA: Diagnosis not present

## 2019-08-28 DIAGNOSIS — I509 Heart failure, unspecified: Secondary | ICD-10-CM | POA: Diagnosis not present

## 2019-08-28 DIAGNOSIS — Z79899 Other long term (current) drug therapy: Secondary | ICD-10-CM | POA: Insufficient documentation

## 2019-08-28 HISTORY — PX: COLONOSCOPY WITH PROPOFOL: SHX5780

## 2019-08-28 SURGERY — COLONOSCOPY WITH PROPOFOL
Anesthesia: Monitor Anesthesia Care

## 2019-08-28 MED ORDER — LIDOCAINE 2% (20 MG/ML) 5 ML SYRINGE
INTRAMUSCULAR | Status: DC | PRN
  Administered 2019-08-28: 100 mg via INTRAVENOUS

## 2019-08-28 MED ORDER — SODIUM CHLORIDE 0.9 % IV SOLN: INTRAVENOUS | Status: DC

## 2019-08-28 MED ORDER — LACTATED RINGERS IV SOLN
INTRAVENOUS | Status: DC
  Administered 2019-08-28: 13:00:00 1000 mL via INTRAVENOUS

## 2019-08-28 MED ORDER — PROPOFOL 10 MG/ML IV BOLUS
INTRAVENOUS | Status: AC
  Filled 2019-08-28: qty 20

## 2019-08-28 MED ORDER — ONDANSETRON HCL 4 MG/2ML IJ SOLN
INTRAMUSCULAR | Status: AC
  Filled 2019-08-28: qty 2

## 2019-08-28 MED ORDER — PHENYLEPHRINE 40 MCG/ML (10ML) SYRINGE FOR IV PUSH (FOR BLOOD PRESSURE SUPPORT)
PREFILLED_SYRINGE | INTRAVENOUS | Status: DC | PRN
  Administered 2019-08-28 (×2): 80 ug via INTRAVENOUS

## 2019-08-28 MED ORDER — ONDANSETRON HCL 4 MG/2ML IJ SOLN
4.0000 mg | Freq: Once | INTRAMUSCULAR | Status: AC
  Administered 2019-08-28: 4 mg via INTRAVENOUS

## 2019-08-28 MED ORDER — PROPOFOL 10 MG/ML IV BOLUS
INTRAVENOUS | Status: DC | PRN
  Administered 2019-08-28 (×9): 20 mg via INTRAVENOUS

## 2019-08-28 SURGICAL SUPPLY — 21 items

## 2019-08-28 NOTE — Discharge Instructions (Addendum)
° °  No polyps or cancer seen.  You still have diverticulosis but I do not think that has been a problem and often never is.  You do not need another routine colonoscopy but if you have any problems do not hesitate to call me.  Also - please do not do any stool tests for blood at Dr. Keane Police office - you do not need those either.  I appreciate the opportunity to care for you. Gatha Mayer, MD, FACG  YOU HAD AN ENDOSCOPIC PROCEDURE TODAY: Refer to the procedure report and other information in the discharge instructions given to you for any specific questions about what was found during the examination. If this information does not answer your questions, please call Dr. Celesta Aver office at [phone] to clarify.   YOU SHOULD EXPECT: Some feelings of bloating in the abdomen. Passage of more gas than usual. Walking can help get rid of the air that was put into your GI tract during the procedure and reduce the bloating. If you had a lower endoscopy (such as a colonoscopy or flexible sigmoidoscopy) you may notice spotting of blood in your stool or on the toilet paper. Some abdominal soreness may be present for a day or two, also.  DIET: Your first meal following the procedure should be a light meal and then it is ok to progress to your normal diet. A half-sandwich or bowl of soup is an example of a good first meal. Heavy or fried foods are harder to digest and may make you feel nauseous or bloated. Drink plenty of fluids but you should avoid alcoholic beverages for 24 hours.   ACTIVITY: Your care partner should take you home directly after the procedure. You should plan to take it easy, moving slowly for the rest of the day. You can resume normal activity the day after the procedure however YOU SHOULD NOT DRIVE, use power tools, machinery or perform tasks that involve climbing or major physical exertion for 24 hours (because of the sedation medicines used during the test).   SYMPTOMS TO REPORT  IMMEDIATELY: A gastroenterologist can be reached at any hour. Please call [phone]  for any of the following symptoms:  Following lower endoscopy (colonoscopy, flexible sigmoidoscopy) Excessive amounts of blood in the stool  Significant tenderness, worsening of abdominal pains  Swelling of the abdomen that is new, acute  Fever of 100 or higher  Following upper endoscopy (EGD, EUS, ERCP, esophageal dilation) Vomiting of blood or coffee ground material  New, significant abdominal pain  New, significant chest pain or pain under the shoulder blades  Painful or persistently difficult swallowing  New shortness of breath  Black, tarry-looking or red, bloody stools  FOLLOW UP:  If any biopsies were taken you will be contacted by phone or by letter within the next 1-3 weeks. Call [phone]  if you have not heard about the biopsies in 3 weeks.  Please also call with any specific questions about appointments or follow up tests.

## 2019-08-28 NOTE — Progress Notes (Signed)
Pt state post-procedure nausea.  Verbal order given for Zofran.  Zofran administered.  Pt state nausea subsided.  Will continue to monitor.

## 2019-08-28 NOTE — Anesthesia Postprocedure Evaluation (Signed)
Anesthesia Post Note  Patient: Kevin Murillo  Procedure(s) Performed: COLONOSCOPY WITH PROPOFOL (N/A )     Patient location during evaluation: PACU Anesthesia Type: MAC Level of consciousness: awake and alert Pain management: pain level controlled Vital Signs Assessment: post-procedure vital signs reviewed and stable Respiratory status: spontaneous breathing, nonlabored ventilation and respiratory function stable Cardiovascular status: stable and blood pressure returned to baseline Anesthetic complications: no    Last Vitals:  Vitals:   08/28/19 1341 08/28/19 1351  BP: 96/61 119/89  Pulse:    Resp: 19 16  Temp:    SpO2: 100% 97%    Last Pain:  Vitals:   08/28/19 1351  TempSrc:   PainSc: 0-No pain                 Audry Pili

## 2019-08-28 NOTE — Anesthesia Preprocedure Evaluation (Addendum)
Anesthesia Evaluation  Patient identified by MRN, date of birth, ID band Patient awake    Reviewed: Allergy & Precautions, NPO status , Patient's Chart, lab work & pertinent test results, reviewed documented beta blocker date and time   History of Anesthesia Complications Negative for: history of anesthetic complications  Airway Mallampati: II  TM Distance: >3 FB Neck ROM: Full    Dental  (+) Dental Advisory Given, Missing,    Pulmonary former smoker,    Pulmonary exam normal        Cardiovascular hypertension, Pt. on medications and Pt. on home beta blockers +CHF  Normal cardiovascular exam   '20 TTE - EF 30-35%. Left ventricular diastolic Doppler parameters are consistent with pseudonormalization Left ventricular diffuse hypokinesis. LA size was moderately dilated. RA was mildly dilated.    Neuro/Psych negative neurological ROS  negative psych ROS   GI/Hepatic negative GI ROS, Neg liver ROS,   Endo/Other   Obesity   Renal/GU negative Renal ROS     Musculoskeletal negative musculoskeletal ROS (+)   Abdominal   Peds  Hematology negative hematology ROS (+)   Anesthesia Other Findings   Reproductive/Obstetrics                           Anesthesia Physical Anesthesia Plan  ASA: III  Anesthesia Plan: MAC   Post-op Pain Management:    Induction: Intravenous  PONV Risk Score and Plan: 1 and Propofol infusion and Treatment may vary due to age or medical condition  Airway Management Planned: Natural Airway and Simple Face Mask  Additional Equipment: None  Intra-op Plan:   Post-operative Plan:   Informed Consent: I have reviewed the patients History and Physical, chart, labs and discussed the procedure including the risks, benefits and alternatives for the proposed anesthesia with the patient or authorized representative who has indicated his/her understanding and acceptance.        Plan Discussed with: CRNA and Anesthesiologist  Anesthesia Plan Comments:        Anesthesia Quick Evaluation

## 2019-08-28 NOTE — Anesthesia Procedure Notes (Signed)
Date/Time: 08/28/2019 12:55 PM Performed by: Talbot Grumbling, CRNA Oxygen Delivery Method: Simple face mask

## 2019-08-28 NOTE — Transfer of Care (Signed)
Immediate Anesthesia Transfer of Care Note  Patient: Kevin Murillo  Procedure(s) Performed: COLONOSCOPY WITH PROPOFOL (N/A )  Patient Location: PACU  Anesthesia Type:MAC  Level of Consciousness: sedated  Airway & Oxygen Therapy: Patient Spontanous Breathing and Patient connected to face mask oxygen  Post-op Assessment: Report given to RN and Post -op Vital signs reviewed and stable  Post vital signs: Reviewed and stable  Last Vitals:  Vitals Value Taken Time  BP    Temp    Pulse    Resp    SpO2      Last Pain:  Vitals:   08/28/19 1231  TempSrc: Oral  PainSc: 0-No pain         Complications: No apparent anesthesia complications

## 2019-08-28 NOTE — Interval H&P Note (Signed)
History and Physical Interval Note:  08/28/2019 12:50 PM  Kevin Murillo  has presented today for surgery, with the diagnosis of Hx of polyps.  The various methods of treatment have been discussed with the patient and family. After consideration of risks, benefits and other options for treatment, the patient has consented to  Procedure(s): COLONOSCOPY WITH PROPOFOL (N/A) as a surgical intervention.  The patient's history has been reviewed, patient examined, no change in status, stable for surgery.  I have reviewed the patient's chart and labs.  Questions were answered to the patient's satisfaction.     Silvano Rusk

## 2019-08-28 NOTE — Op Note (Signed)
[facility] Patient Name: Kevin Murillo Procedure Date: 08/28/2019 MRN: [id number] Attending MD: Gatha Mayer , MD Date of Birth: [birth year] CSN: [id number] Age: 73 Admit Type: Inpatient Procedure:                Colonoscopy Indications:              Surveillance: Personal history of adenomatous                            polyps on last colonoscopy > 5 years ago Providers:                Gatha Mayer, MD, Josie Dixon, RN, Carlyn Reichert, RN, Ladona Ridgel, Technician Referring MD:              Medicines:                Propofol per Anesthesia, Monitored Anesthesia Care Complications:            No immediate complications. Estimated Blood Loss:     Estimated blood loss: none. Procedure:                Pre-Anesthesia Assessment:                           - Prior to the procedure, a History and Physical                            was performed, and patient medications and                            allergies were reviewed. The patient's tolerance of                            previous anesthesia was also reviewed. The risks                            and benefits of the procedure and the sedation                            options and risks were discussed with the patient.                            All questions were answered, and informed consent                            was obtained. Prior Anticoagulants: The patient has                            taken no previous anticoagulant or antiplatelet                            agents. ASA Grade Assessment: III - A patient with  severe systemic disease. After reviewing the risks                            and benefits, the patient was deemed in                            satisfactory condition to undergo the procedure.                           After obtaining informed consent, the colonoscope                            was passed under direct vision.  Throughout the                            procedure, the patient's blood pressure, pulse, and                            oxygen saturations were monitored continuously. The                            [id number]) Olympus colonoscope was                            introduced through the anus and advanced to the the                            cecum, identified by appendiceal orifice and                            ileocecal valve. The colonoscopy was performed                            without difficulty. The patient tolerated the                            procedure well. The quality of the bowel                            preparation was good. The bowel preparation used                            was Miralax via split dose instruction. The                            ileocecal valve, appendiceal orifice, and rectum                            were photographed. Scope In: 1:01:00 PM Scope Out: 1:17:47 PM Scope Withdrawal Time: 0 hours 10 minutes 58 seconds  Total Procedure Duration: 0 hours 16 minutes 47 seconds  Findings:      The perianal and digital rectal examinations were normal. Pertinent       negatives include normal prostate (size, shape, and consistency).  Multiple diverticula were found in the left colon.      The exam was otherwise without abnormality on direct and retroflexion       views. Impression:               - Diverticulosis in the left colon.                           - The examination was otherwise normal on direct                            and retroflexion views.                           - No specimens collected.                           - Personal history of colonic polyp diminutive                            adenoma 2014. Moderate Sedation:      Not Applicable - Patient had care per Anesthesia. Recommendation:           - Patient has a contact number available for                            emergencies. The signs and symptoms of potential                             delayed complications were discussed with the                            patient. Return to normal activities tomorrow.                            Written discharge instructions were provided to the                            patient.                           - Resume previous diet.                           - Continue present medications.                           - No repeat colonoscopy and no repeat hemoccults                            due to age and the absence of colonic polyps. Procedure Code(s):        --- Professional ---                           [id number], Colorectal cancer screening; colonoscopy on  individual at high risk Diagnosis Code(s):        --- Professional ---                           Z86.010, Personal history of colonic polyps                           K57.30, Diverticulosis of large intestine without                            perforation or abscess without bleeding CPT copyright 2019 [employer] Association. All rights reserved. The codes documented in this report are preliminary and upon coder review may  be revised to meet current compliance requirements. Gatha Mayer, MD 08/28/2019 1:32:13 PM This report has been signed electronically. Number of Addenda: 0

## 2019-09-21 ENCOUNTER — Other Ambulatory Visit (HOSPITAL_COMMUNITY): Payer: Self-pay | Admitting: Cardiology

## 2019-09-23 ENCOUNTER — Other Ambulatory Visit (HOSPITAL_COMMUNITY): Payer: Self-pay

## 2019-09-23 DIAGNOSIS — I428 Other cardiomyopathies: Secondary | ICD-10-CM

## 2019-09-23 MED ORDER — SPIRONOLACTONE 25 MG PO TABS: 25.0000 mg | ORAL_TABLET | Freq: Every day | ORAL | 6 refills | Status: DC

## 2019-09-23 MED ORDER — BIDIL 20-37.5 MG PO TABS: 1.0000 | ORAL_TABLET | Freq: Three times a day (TID) | ORAL | 3 refills | Status: DC

## 2019-10-15 DIAGNOSIS — M1612 Unilateral primary osteoarthritis, left hip: Secondary | ICD-10-CM | POA: Diagnosis not present

## 2019-10-15 DIAGNOSIS — M160 Bilateral primary osteoarthritis of hip: Secondary | ICD-10-CM | POA: Diagnosis not present

## 2019-10-15 DIAGNOSIS — M25551 Pain in right hip: Secondary | ICD-10-CM | POA: Diagnosis not present

## 2019-10-15 DIAGNOSIS — M1611 Unilateral primary osteoarthritis, right hip: Secondary | ICD-10-CM | POA: Diagnosis not present

## 2019-11-01 ENCOUNTER — Telehealth (HOSPITAL_COMMUNITY): Payer: Self-pay | Admitting: Cardiology

## 2019-11-01 NOTE — Telephone Encounter (Signed)
Medical clearance completed by Dr Aundra Dubin Patient is approved to proceed with procedure (right total hip arthroplasty under spinal anesthesia)   from a cardiac standpoint. Medication prep- n/a  Faxed to [facility] [phone] Attn Dr.Swintek/Ashley Ashok Croon

## 2019-11-06 ENCOUNTER — Ambulatory Visit: Payer: Self-pay | Admitting: Orthopedic Surgery

## 2019-11-18 NOTE — Patient Instructions (Addendum)
DUE TO COVID-19 ONLY ONE VISITOR IS ALLOWED TO COME WITH YOU AND STAY IN THE WAITING ROOM ONLY DURING PRE OP AND PROCEDURE DAY OF SURGERY. THE 1 VISITOR MAY VISIT WITH YOU AFTER SURGERY IN YOUR PRIVATE ROOM DURING VISITING HOURS ONLY!  YOU NEED TO HAVE A COVID 19 TEST ON__12/26 @_9 :55_, THIS TEST MUST BE DONE BEFORE SURGERY, COME TO: [place], [id number].  [facility]). ONCE YOUR COVID TEST IS COMPLETED, PLEASE BEGIN THE QUARANTINE INSTRUCTIONS AS OUTLINED IN YOUR HANDOUT.                Kevin Murillo    Your procedure is scheduled on: 11/27/19   Report to [facility] Main  Entrance   Report to admitting at  8:35 AM     Call this number if you have problems the morning of surgery [phone]   . BRUSH YOUR TEETH MORNING OF SURGERY AND RINSE YOUR MOUTH OUT, NO CHEWING GUM CANDY OR MINTS.    Do not eat food After Midnight.   YOU MAY HAVE CLEAR LIQUIDS FROM MIDNIGHT UNTIL 8AM.   At 8AM Please finish the prescribed Pre-Surgery Ensure drink.   Nothing by mouth after you finish the Ensure drink !   Take these medicines the morning of surgery with A SIP OF WATER: Coreg (also known as carvedilol), Ofilia Rayon Andrea (also known as sacubitril/valsartan)                                 You may not have any metal on your body including               piercings  Do not wear jewelry, lotions, powders or  deodorant                      Men may shave face and neck.   Do not bring valuables to the hospital. [place].  Contacts, dentures or bridgework may not be worn into surgery.       Patients discharged the day of surgery will not be allowed to drive home. IF YOU ARE HAVING SURGERY AND GOING HOME THE SAME DAY, YOU MUST HAVE AN ADULT TO DRIVE YOU HOME AND BE WITH YOU FOR 24 HOURS . YOU MAY GO HOME BY TAXI OR UBER OR ORTHERWISE, BUT AN ADULT MUST ACCOMPANY YOU HOME AND STAY WITH YOU FOR 24  HOURS.  Name and phone number of your driver:  Special Instructions: N/A              Please read over the following fact sheets you were given: _____________________________________________________________________             [facility] - Preparing for Surgery  Before surgery, you can play an important role.   Because skin is not sterile, your skin needs to be as free of germs as possible.   You can reduce the number of germs on your skin by washing with CHG (chlorahexidine gluconate) soap before surgery.   CHG is an antiseptic cleaner which kills germs and bonds with the skin to continue killing germs even after washing. Please DO NOT use if you have an allergy to CHG or antibacterial soaps.  If your skin becomes reddened/irritated stop using the CHG and inform your nurse when  you arrive at Short Stay.  You may shave your face/neck.  Please follow these instructions carefully:  1.  Shower with CHG Soap the night before surgery and the  morning of Surgery.  2.  If you choose to wash your hair, wash your hair first as usual with your  normal  shampoo.  3.  After you shampoo, rinse your hair and body thoroughly to remove the  shampoo.                                        4.  Use CHG as you would any other liquid soap.  You can apply chg directly  to the skin and wash                       Gently with a scrungie or clean washcloth.  5.  Apply the CHG Soap to your body ONLY FROM THE NECK DOWN.   Do not use on face/ open                           Wound or open sores. Avoid contact with eyes, ears mouth and genitals (private parts).                       Wash face,  Genitals (private parts) with your normal soap.             6.  Wash thoroughly, paying special attention to the area where your surgery  will be performed.  7.  Thoroughly rinse your body with warm water from the neck down.  8.  DO NOT shower/wash with your normal soap after using and rinsing off  the CHG Soap.              9.  Pat yourself dry with a clean towel.            10.  Wear clean pajamas.            11.  Place clean sheets on your bed the night of your first shower and do not  sleep with pets. Day of Surgery : Do not apply any lotions/deodorants the morning of surgery.  Please wear clean clothes to the hospital/surgery center.  FAILURE TO FOLLOW THESE INSTRUCTIONS MAY RESULT IN THE CANCELLATION OF YOUR SURGERY PATIENT SIGNATURE_________________________________  NURSE SIGNATURE__________________________________  ________________________________________________________________________   Adam Phenix  An incentive spirometer is a tool that can help keep your lungs clear and active. This tool measures how well you are filling your lungs with each breath. Taking long deep breaths may help reverse or decrease the chance of developing breathing (pulmonary) problems (especially infection) following:  A long period of time when you are unable to move or be active. BEFORE THE PROCEDURE   If the spirometer includes an indicator to show your best effort, your nurse or respiratory therapist will set it to a desired goal.  If possible, sit up straight or lean slightly forward. Try not to slouch.  Hold the incentive spirometer in an upright position. INSTRUCTIONS FOR USE  1. Sit on the edge of your bed if possible, or sit up as far as you can in bed or on a chair. 2. Hold the incentive spirometer in an upright position. 3. Breathe out normally. 4. Place the mouthpiece in your mouth  and seal your lips tightly around it. 5. Breathe in slowly and as deeply as possible, raising the piston or the ball toward the top of the column. 6. Hold your breath for 3-5 seconds or for as long as possible. Allow the piston or ball to fall to the bottom of the column. 7. Remove the mouthpiece from your mouth and breathe out normally. 8. Rest for a few seconds and repeat Steps 1 through 7 at least 10 times every 1-2  hours when you are awake. Take your time and take a few normal breaths between deep breaths. 9. The spirometer may include an indicator to show your best effort. Use the indicator as a goal to work toward during each repetition. 10. After each set of 10 deep breaths, practice coughing to be sure your lungs are clear. If you have an incision (the cut made at the time of surgery), support your incision when coughing by placing a pillow or rolled up towels firmly against it. Once you are able to get out of bed, walk around indoors and cough well. You may stop using the incentive spirometer when instructed by your caregiver.  RISKS AND COMPLICATIONS  Take your time so you do not get dizzy or light-headed.  If you are in pain, you may need to take or ask for pain medication before doing incentive spirometry. It is harder to take a deep breath if you are having pain. AFTER USE  Rest and breathe slowly and easily.  It can be helpful to keep track of a log of your progress. Your caregiver can provide you with a simple table to help with this. If you are using the spirometer at home, follow these instructions: [place] IF:   You are having difficultly using the spirometer.  You have trouble using the spirometer as often as instructed.  Your pain medication is not giving enough relief while using the spirometer.  You develop fever of 100.5 F (38.1 C) or higher. SEEK IMMEDIATE MEDICAL CARE IF:   You cough up bloody sputum that had not been present before.  You develop fever of 102 F (38.9 C) or greater.  You develop worsening pain at or near the incision site. MAKE SURE YOU:   Understand these instructions.  Will watch your condition.  Will get help right away if you are not doing well or get worse. Document Released: 03/27/2007 Document Revised: 02/06/2012 Document Reviewed: 05/28/2007 ExitCare Patient Information 2014 ExitCare,  [place].   ________________________________________________________________________  WHAT IS A BLOOD TRANSFUSION? Blood Transfusion Information  A transfusion is the replacement of blood or some of its parts. Blood is made up of multiple cells which provide different functions.  Red blood cells carry oxygen and are used for blood loss replacement.  White blood cells fight against infection.  Platelets control bleeding.  Plasma helps clot blood.  Other blood products are available for specialized needs, such as hemophilia or other clotting disorders. BEFORE THE TRANSFUSION  Who gives blood for transfusions?   Healthy volunteers who are fully evaluated to make sure their blood is safe. This is blood bank blood. Transfusion therapy is the safest it has ever been in the practice of medicine. Before blood is taken from a donor, a complete history is taken to make sure that person has no history of diseases nor engages in risky social behavior (examples are intravenous drug use or sexual activity with multiple partners). The donor's travel history is screened to minimize risk of transmitting  infections, such as malaria. The donated blood is tested for signs of infectious diseases, such as HIV and hepatitis. The blood is then tested to be sure it is compatible with you in order to minimize the chance of a transfusion reaction. If you or a relative donates blood, this is often done in anticipation of surgery and is not appropriate for emergency situations. It takes many days to process the donated blood. RISKS AND COMPLICATIONS Although transfusion therapy is very safe and saves many lives, the main dangers of transfusion include:   Getting an infectious disease.  Developing a transfusion reaction. This is an allergic reaction to something in the blood you were given. Every precaution is taken to prevent this. The decision to have a blood transfusion has been considered carefully by your caregiver  before blood is given. Blood is not given unless the benefits outweigh the risks. AFTER THE TRANSFUSION  Right after receiving a blood transfusion, you will usually feel much better and more energetic. This is especially true if your red blood cells have gotten low (anemic). The transfusion raises the level of the red blood cells which carry oxygen, and this usually causes an energy increase.  The nurse administering the transfusion will monitor you carefully for complications. HOME CARE INSTRUCTIONS  No special instructions are needed after a transfusion. You may find your energy is better. Speak with your caregiver about any limitations on activity for underlying diseases you may have. SEEK MEDICAL CARE IF:   Your condition is not improving after your transfusion.  You develop redness or irritation at the intravenous (IV) site. SEEK IMMEDIATE MEDICAL CARE IF:  Any of the following symptoms occur over the next 12 hours:  Shaking chills.  You have a temperature by mouth above 102 F (38.9 C), not controlled by medicine.  Chest, back, or muscle pain.  People around you feel you are not acting correctly or are confused.  Shortness of breath or difficulty breathing.  Dizziness and fainting.  You get a rash or develop hives.  You have a decrease in urine output.  Your urine turns a dark color or changes to pink, red, or brown. Any of the following symptoms occur over the next 10 days:  You have a temperature by mouth above 102 F (38.9 C), not controlled by medicine.  Shortness of breath.  Weakness after normal activity.  The white part of the eye turns yellow (jaundice).  You have a decrease in the amount of urine or are urinating less often.  Your urine turns a dark color or changes to pink, red, or brown. Document Released: 11/11/2000 Document Revised: 02/06/2012 Document Reviewed: 06/30/2008 [facility] Patient Information 2014 [place],  [place].  _______________________________________________________________________

## 2019-11-19 ENCOUNTER — Encounter (HOSPITAL_COMMUNITY)
Admission: RE | Admit: 2019-11-19 | Discharge: 2019-11-19 | Disposition: A | Discharge status: Home / Self Care | Payer: PPO | Source: Ambulatory Visit | Attending: Orthopedic Surgery | Admitting: Orthopedic Surgery

## 2019-11-19 ENCOUNTER — Other Ambulatory Visit: Payer: Self-pay

## 2019-11-19 ENCOUNTER — Encounter (HOSPITAL_COMMUNITY): Payer: Self-pay

## 2019-11-19 DIAGNOSIS — Z01812 Encounter for preprocedural laboratory examination: Secondary | ICD-10-CM | POA: Diagnosis not present

## 2019-11-19 NOTE — Progress Notes (Signed)
PCP -  Shon Baton, MD Cardiologist - Nelva Bush, MD  Chest x-ray -  EKG - 01/17/2019 Stress Test -  ECHO -  Cardiac Cath -  Cardiac MRI 2019  Sleep Study - n/a CPAP - n/a  Fasting Blood Sugar - n/a Checks Blood Sugar _____ times a day  Blood Thinner Instructions: Aspirin Instructions: will continue taking until surgery Last Dose:  Anesthesia review:   Patient denies shortness of breath, fever, cough and chest pain at PAT appointment   Patient verbalized understanding of instructions that were given to them at the PAT appointment. Patient was also instructed that they will need to review over the PAT instructions again at home before surgery.

## 2019-11-20 ENCOUNTER — Encounter (HOSPITAL_COMMUNITY)
Admission: RE | Admit: 2019-11-20 | Discharge: 2019-11-20 | Disposition: A | Discharge status: Home / Self Care | Payer: PPO | Source: Ambulatory Visit | Attending: Orthopedic Surgery | Admitting: Orthopedic Surgery

## 2019-11-20 ENCOUNTER — Other Ambulatory Visit: Payer: Self-pay | Admitting: Physician Assistant

## 2019-11-20 DIAGNOSIS — Z01812 Encounter for preprocedural laboratory examination: Secondary | ICD-10-CM | POA: Diagnosis not present

## 2019-11-20 DIAGNOSIS — I428 Other cardiomyopathies: Secondary | ICD-10-CM

## 2019-11-20 LAB — COMPREHENSIVE METABOLIC PANEL
ALT: 16 U/L (ref 0–44)
AST: 18 U/L (ref 15–41)
Albumin: 4.4 g/dL (ref 3.5–5.0)
Alkaline Phosphatase: 55 U/L (ref 38–126)
Anion gap: 9 (ref 5–15)
BUN: 26 mg/dL — ABNORMAL HIGH (ref 8–23)
CO2: 25 mmol/L (ref 22–32)
Calcium: 9.6 mg/dL (ref 8.9–10.3)
Chloride: 104 mmol/L (ref 98–111)
Creatinine, Ser: 1.07 mg/dL (ref 0.61–1.24)
GFR calc Af Amer: >60 mL/min (ref >60)
GFR calc non Af Amer: >60 mL/min (ref >60)
Glucose, Bld: 89 mg/dL (ref 70–99)
Potassium: 4 mmol/L (ref 3.5–5.1)
Sodium: 138 mmol/L (ref 135–145)
Total Bilirubin: 1.2 mg/dL (ref 0.3–1.2)
Total Protein: 7.9 g/dL (ref 6.5–8.1)

## 2019-11-20 LAB — CBC
HCT: 43.4 % (ref 39.0–52.0)
Hemoglobin: 13.4 g/dL (ref 13.0–17.0)
MCH: 29.5 pg (ref 26.0–34.0)
MCHC: 30.9 g/dL (ref 30.0–36.0)
MCV: 95.4 fL (ref 80.0–100.0)
Platelets: 242 10*3/uL (ref 150–400)
RBC: 4.55 MIL/uL (ref 4.22–5.81)
RDW: 13.7 % (ref 11.5–15.5)
WBC: 6 10*3/uL (ref 4.0–10.5)
nRBC: 0 % (ref 0.0–0.2)

## 2019-11-20 LAB — URINALYSIS, ROUTINE W REFLEX MICROSCOPIC
Bilirubin Urine: NEGATIVE
Glucose, UA: NEGATIVE mg/dL
Ketones, ur: NEGATIVE mg/dL
Nitrite: NEGATIVE
Protein, ur: 30 mg/dL — AB
Specific Gravity, Urine: 1.018 (ref 1.005–1.030)
WBC, UA: >50 WBC/hpf — ABNORMAL HIGH (ref 0–5)
pH: 5 (ref 5.0–8.0)

## 2019-11-20 LAB — SURGICAL PCR SCREEN
MRSA, PCR: NEGATIVE
Staphylococcus aureus: NEGATIVE

## 2019-11-20 LAB — PROTIME-INR
INR: 1 (ref 0.8–1.2)
Prothrombin Time: 13.3 seconds (ref 11.4–15.2)

## 2019-11-20 LAB — ABO/RH: ABO/RH(D): O POS

## 2019-11-23 ENCOUNTER — Other Ambulatory Visit (HOSPITAL_COMMUNITY)
Admission: RE | Admit: 2019-11-23 | Discharge: 2019-11-23 | Disposition: A | Discharge status: Home / Self Care | Payer: PPO | Source: Ambulatory Visit | Attending: Orthopedic Surgery | Admitting: Orthopedic Surgery

## 2019-11-23 DIAGNOSIS — Z01812 Encounter for preprocedural laboratory examination: Secondary | ICD-10-CM | POA: Diagnosis not present

## 2019-11-23 DIAGNOSIS — Z20828 Contact with and (suspected) exposure to other viral communicable diseases: Secondary | ICD-10-CM | POA: Insufficient documentation

## 2019-11-24 LAB — NOVEL CORONAVIRUS, NAA (HOSP ORDER, SEND-OUT TO REF LAB; TAT 18-24 HRS): SARS-CoV-2, NAA: NOT DETECTED

## 2019-11-25 ENCOUNTER — Ambulatory Visit: Payer: Self-pay | Admitting: Orthopedic Surgery

## 2019-11-25 NOTE — H&P (Signed)
TOTAL HIP ADMISSION H&P  Patient is admitted for right total hip arthroplasty.  Subjective:  Chief Complaint: right hip pain  HPI: Kevin Murillo, 73 y.o. male, has a history of pain and functional disability in the right hip(s) due to arthritis and patient has failed non-surgical conservative treatments for greater than 12 weeks to include NSAID's and/or analgesics, flexibility and strengthening excercises, use of assistive devices, weight reduction as appropriate and activity modification.  Onset of symptoms was gradual starting 5 years ago with rapidlly worsening course since that time.The patient noted no past surgery on the right hip(s).  Patient currently rates pain in the right hip at 10 out of 10 with activity. Patient has night pain, worsening of pain with activity and weight bearing, trendelenberg gait, pain that interfers with activities of daily living, pain with passive range of motion and crepitus. Patient has evidence of subchondral cysts, subchondral sclerosis, periarticular osteophytes and joint space narrowing by imaging studies. This condition presents safety issues increasing the risk of falls.   There is no current active infection.  Patient Active Problem List   Diagnosis Date Noted  . Dilated cardiomyopathy ([place]) 05/07/2013  . Personal history of colonic adenomas 11/04/2012  . HYPERLIPIDEMIA-MIXED 06/16/2010  . Essential hypertension 05/27/2010  . LEFT HEART FAILURE 05/27/2010   Past Medical History:  Diagnosis Date  . Chronic systolic CHF (congestive heart failure), NYHA class 1 ([place])   . History of echocardiogram    Echo 3/16: Mild LVH, EF 40-45%, anteroseptal akinesis, grade 1 diastolic dysfunction, moderate LAE // Echo 3/19: diff HK worse in basal and mid inf wall, mild LVH, EF 25-30, mild MR, mild LAE  . Hyperlipidemia   . Hypertension   . Neuropathy of hand   . Non-ischemic cardiomyopathy ([place])    a. Echo 6/11: 30-35% // b. LHC 7/11: EF 30-35% with  diffuse hypokinesis, no angiographic CAD. SPEP and Fe studies unremarkable, TSH normal.//  c. Echo 1/12: EF 45% // d. Echo 6/14: EF 20-25%  //  e. Echo EF 25-30%, diffuse hypokinesis, prominent apical trabeculation // f. CPX 3/15: normal fxnal capacity  // g. CPX 3/16 low norma fxnal capacity  // h. Echo 3/16: mild LVH, EF 40-45%   . Obesity   . PVC (premature ventricular contraction)   . Rosacea   . Tubular adenoma of colon 12/2010    Past Surgical History:  Procedure Laterality Date  . ARM SURGERY  1967   RIGHT   . COLONOSCOPY    . COLONOSCOPY WITH PROPOFOL N/A 08/28/2019   Procedure: COLONOSCOPY WITH PROPOFOL;  Surgeon: Gatha Mayer, MD;  Location: [facility];  Service: Endoscopy;  Laterality: N/A;  . CYST EXCISION Left 07/29/2019   Procedure: EXCISION OF CHRONIC LEFT LOWER BACK CYST;  Surgeon: Coralie Keens, MD;  Location: [facility];  Service: General;  Laterality: Left;  . KNEE ARTHROSCOPY Left   . RIGHT/LEFT HEART CATH AND CORONARY ANGIOGRAPHY N/A 03/09/2018   Procedure: RIGHT/LEFT HEART CATH AND CORONARY ANGIOGRAPHY;  Surgeon: Larey Dresser, MD;  Location: [facility];  Service: Cardiovascular;  Laterality: N/A;    Current Outpatient Medications  Medication Sig Dispense Refill Last Dose  . aspirin 81 MG tablet Take 81 mg by mouth daily.     . carvedilol (COREG) 25 MG tablet TAKE 1 TABLET BY MOUTH TWICE A DAY (Patient taking differently: Take 25 mg by mouth 2 (two) times daily with a meal. ) 180 tablet 1   . ENTRESTO 97-103 MG TAKE  1 TABLET BY MOUTH TWICE A DAY (Patient taking differently: Take 1 tablet by mouth 2 (two) times daily. ) 60 tablet 6   . isosorbide-hydrALAZINE (BIDIL) 20-37.5 MG tablet Take 1 tablet by mouth 3 (three) times daily. 30 tablet 3   . Multiple Vitamin (MULTIVITAMIN WITH MINERALS) TABS tablet Take 1 tablet by mouth daily.     . simvastatin (ZOCOR) 40 MG tablet Take 40 mg by mouth daily.      Marland Kitchen spironolactone (ALDACTONE) 25 MG tablet Take 1 tablet  (25 mg total) by mouth daily. 30 tablet 6    No current facility-administered medications for this visit.   No Known Allergies  Social History   Tobacco Use  . Smoking status: Former Smoker    Types: Cigarettes    Quit date: 03/12/2002    Years since quitting: 17.7  . Smokeless tobacco: Never Used  Substance Use Topics  . Alcohol use: Yes    Comment: Socially    Family History  Problem Relation Age of Onset  . CVA Mother   . Heart attack Other   . Gout Son   . Colon cancer Neg Hx      Review of Systems  Constitutional: Negative.   HENT: Negative.   Eyes: Negative.   Respiratory: Negative.   Cardiovascular: Negative.   Gastrointestinal: Negative.   Endocrine: Negative.   Genitourinary: Positive for difficulty urinating, enuresis and frequency.  Musculoskeletal: Positive for arthralgias, back pain and gait problem.  Skin: Negative.   Allergic/Immunologic: Negative.   Hematological: Negative.   Psychiatric/Behavioral: Negative.     Objective:  Physical Exam  Vital signs in last 24 hours: @VSRANGES @  Labs:   Estimated body mass index is 36.98 kg/m as calculated from the following:   Height as of 11/20/19: 5\' 9"  (1.753 m).   Weight as of 11/20/19: 113.6 kg.   Imaging Review Plain radiographs demonstrate severe degenerative joint disease of the right hip(s). The bone quality appears to be adequate for age and reported activity level.      Assessment/Plan:  End stage arthritis, right hip(s)  The patient history, physical examination, clinical judgement of the provider and imaging studies are consistent with end stage degenerative joint disease of the right hip(s) and total hip arthroplasty is deemed medically necessary. The treatment options including medical management, injection therapy, arthroscopy and arthroplasty were discussed at length. The risks and benefits of total hip arthroplasty were presented and reviewed. The risks due to aseptic loosening,  infection, stiffness, dislocation/subluxation,  thromboembolic complications and other imponderables were discussed.  The patient acknowledged the explanation, agreed to proceed with the plan and consent was signed. Patient is being admitted for inpatient treatment for surgery, pain control, PT, OT, prophylactic antibiotics, VTE prophylaxis, progressive ambulation and ADL's and discharge planning.The patient is planning to be discharged home with HEP  Plan for overnight OBS due to cardiac issues.

## 2019-11-25 NOTE — Progress Notes (Signed)
Anesthesia Chart Review   Case: [id number] Date/Time: 11/27/19 1051   Procedure: TOTAL HIP ARTHROPLASTY ANTERIOR APPROACH (Right Hip)   Anesthesia type: Spinal   Pre-op diagnosis: Degenerative joint disease right hip   Location: [facility] ROOM 07 / [facility]   Surgeons: Rod Can, MD      DISCUSSION:73 y.o. former smoker (quit 03/12/02) with h/o HTN, HLD, nonischemic cardiomyopathy, right hip djd scheduled for above procedure 11/27/2019 with Dr. Rod Can.   Cleared by cardiologist, Dr. Loralie Champagne, clearance on chart.  Per Dr. Aundra Dubin, "[facility] for surgery as long as no new symptoms since l last saw him earlier this year."  Last seen by Dr. Aundra Dubin 01/17/2019, Per OV note, "Nonischemic cardiomyopathy: Possibly due to viral myocarditis versus HTN.  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 Last echo in 4/19 showed EF 25-30%, stable compared to the past.  No CAD on 4/19 cath, but CI marginal at 2.01 with mildly elevated PCWP. NYHA class II symptoms.  Not volume overloaded on exam.  He did not get an ICD in the past with minimal symptoms and nonischemic etiology. He is not interested in an ICD at this point, would not push him given no CAD unless he is proven to have ventricular arrhythmias." Stable since that visit.   Anticipate pt can proceed with planned procedure barring acute status change.   VS: BP 122/66   Pulse 64   Temp 36.8 C (Oral)   Resp 18   Ht 5\' 9"  (1.753 m)   Wt 113.6 kg   SpO2 98%   BMI 36.98 kg/m   PROVIDERS: Shon Baton, MD is PCP   End, Harrell Gave, MD is Cardiologist  LABS: Labs reviewed: Acceptable for surgery. (all labs ordered are listed, but only abnormal results are displayed)  Labs Reviewed  COMPREHENSIVE METABOLIC PANEL - Abnormal; Notable for the following components:      Result Value   BUN 26 (*)    All other components within normal limits  URINALYSIS, ROUTINE W REFLEX MICROSCOPIC - Abnormal;  Notable for the following components:   APPearance CLOUDY (*)    Hgb urine dipstick SMALL (*)    Protein, ur 30 (*)    Leukocytes,Ua LARGE (*)    WBC, UA >50 (*)    Bacteria, UA MANY (*)    All other components within normal limits  SURGICAL PCR SCREEN  CBC  PROTIME-INR  TYPE AND SCREEN  ABO/RH     IMAGES:   EKG: 01/17/2019 Rate 54 bpm  Sinus bradycardia with marked sinus arrhythmia with occasional Premature ventricular complexes Lateral infarct , age undetermined ST & T wave abnormality, consider inferior ischemia Abnormal ECG Since last tracing rate slower  CV: Echo 01/17/2019 IMPRESSIONS   1. The left ventricle has moderate-severely reduced systolic function, with an ejection fraction of 30-35%. The cavity size was normal. Left ventricular diastolic Doppler parameters are consistent with pseudonormalization Left ventricular diffuse  hypokinesis.  2. The right ventricle has normal systolic function. The cavity was normal. There is no increase in right ventricular wall thickness.  3. Left atrial size was moderately dilated.  4. Right atrial size was mildly dilated.  5. The mitral valve is normal in structure. No evidence of mitral valve stenosis.  6. The tricuspid valve is normal in structure.  7. The aortic valve is tricuspid no stenosis of the aortic valve.  8. The aortic root and ascending aorta are normal in size and structure.  9. IVC normal size. No complete TR doppler jet so unable to estimate PA systolic pressure.  Cardiac Cath 03/09/2018 1. Mildly elevated PCWP.  2. Low cardiac output.  3. Pulmonary venous hypertension (mild).  4. No significant CAD (Nonischemic cardiomyopathy).    He will need to follow with me in CHF clinic for medication titration.  Past Medical History:  Diagnosis Date  . Chronic systolic CHF (congestive heart failure), NYHA class 1 ([place])   . History of echocardiogram    Echo 3/16: Mild LVH, EF 40-45%, anteroseptal akinesis, grade 1  diastolic dysfunction, moderate LAE // Echo 3/19: diff HK worse in basal and mid inf wall, mild LVH, EF 25-30, mild MR, mild LAE  . Hyperlipidemia   . Hypertension   . Neuropathy of hand   . Non-ischemic cardiomyopathy ([place])    a. Echo 6/11: 30-35% // b. LHC 7/11: EF 30-35% with diffuse hypokinesis, no angiographic CAD. SPEP and Fe studies unremarkable, TSH normal.//  c. Echo 1/12: EF 45% // d. Echo 6/14: EF 20-25%  //  e. Echo EF 25-30%, diffuse hypokinesis, prominent apical trabeculation // f. CPX 3/15: normal fxnal capacity  // g. CPX 3/16 low norma fxnal capacity  // h. Echo 3/16: mild LVH, EF 40-45%   . Obesity   . PVC (premature ventricular contraction)   . Rosacea   . Tubular adenoma of colon 12/2010    Past Surgical History:  Procedure Laterality Date  . ARM SURGERY  1967   RIGHT   . COLONOSCOPY    . COLONOSCOPY WITH PROPOFOL N/A 08/28/2019   Procedure: COLONOSCOPY WITH PROPOFOL;  Surgeon: Gatha Mayer, MD;  [facility];  Service: Endoscopy;  Laterality: N/A;  . CYST EXCISION Left 07/29/2019   Procedure: EXCISION OF CHRONIC LEFT LOWER BACK CYST;  Surgeon: Coralie Keens, MD;  Location: [place];  Service: General;  Laterality: Left;  . KNEE ARTHROSCOPY Left   . RIGHT/LEFT HEART CATH AND CORONARY ANGIOGRAPHY N/A 03/09/2018   Procedure: RIGHT/LEFT HEART CATH AND CORONARY ANGIOGRAPHY;  Surgeon: Larey Dresser, MD;  Location: [place] [facility];  Service: Cardiovascular;  Laterality: N/A;    MEDICATIONS: . aspirin 81 MG tablet  . carvedilol (COREG) 25 MG tablet  . ENTRESTO 97-103 MG  . isosorbide-hydrALAZINE (BIDIL) 20-37.5 MG tablet  . Multiple Vitamin (MULTIVITAMIN WITH MINERALS) TABS tablet  . simvastatin (ZOCOR) 40 MG tablet  . spironolactone (ALDACTONE) 25 MG tablet   No current facility-administered medications for this encounter.    Maia Plan [facility] Pre-Surgical Testing [phone] 11/25/19  1:05 PM

## 2019-11-25 NOTE — Anesthesia Preprocedure Evaluation (Addendum)
Anesthesia Evaluation  Patient identified by MRN, date of birth, ID band Patient awake    Reviewed: Allergy & Precautions, NPO status , Patient's Chart, lab work & pertinent test results  Airway Mallampati: II  TM Distance: >3 FB Neck ROM: Full    Dental  (+) Dental Advisory Given,    Pulmonary neg pulmonary ROS, former smoker,    Pulmonary exam normal breath sounds clear to auscultation       Cardiovascular Exercise Tolerance: Poor hypertension, Pt. on medications and Pt. on home beta blockers +CHF  Normal cardiovascular exam Rhythm:Regular Rate:Normal  Echo 01/17/19  Left Ventricle: The left ventricle has moderate-severely reduced systolic function, with an ejection fraction of 30-35%. The cavity size was normal. There is no increase in left ventricular wall thickness. Left ventricular diastolic Doppler parameters  are consistent with pseudonormalization Left ventricular diffuse hypokinesis. Right Ventricle: The right ventricle has normal systolic function. The cavity was normal.   Neuro/Psych  Neuromuscular disease    GI/Hepatic negative GI ROS, Neg liver ROS,   Endo/Other  negative endocrine ROS  Renal/GU K+ 4.0 Cr 1.07     Musculoskeletal   Abdominal (+) + obese,   Peds  Hematology Cr 12.4   Anesthesia Other Findings   Reproductive/Obstetrics                          Anesthesia Physical Anesthesia Plan  ASA: IV  Anesthesia Plan: Spinal   Post-op Pain Management:    Induction:   PONV Risk Score and Plan: Treatment may vary due to age or medical condition and Ondansetron  Airway Management Planned: Nasal Cannula and Natural Airway  Additional Equipment: None  Intra-op Plan:   Post-operative Plan:   Informed Consent: I have reviewed the patients History and Physical, chart, labs and discussed the procedure including the risks, benefits and alternatives for the proposed  anesthesia with the patient or authorized representative who has indicated his/her understanding and acceptance.     Dental advisory given  Plan Discussed with: CRNA  Anesthesia Plan Comments: (See PAT note 11/20/2019, Konrad Felix, PA-C)      Anesthesia Quick Evaluation

## 2019-11-25 NOTE — H&P (View-Only) (Signed)
TOTAL HIP ADMISSION H&P  Patient is admitted for right total hip arthroplasty.  Subjective:  Chief Complaint: right hip pain  HPI: Kevin Murillo, 73 y.o. male, has a history of pain and functional disability in the right hip(s) due to arthritis and patient has failed non-surgical conservative treatments for greater than 12 weeks to include NSAID's and/or analgesics, flexibility and strengthening excercises, use of assistive devices, weight reduction as appropriate and activity modification.  Onset of symptoms was gradual starting 5 years ago with rapidlly worsening course since that time.The patient noted no past surgery on the right hip(s).  Patient currently rates pain in the right hip at 10 out of 10 with activity. Patient has night pain, worsening of pain with activity and weight bearing, trendelenberg gait, pain that interfers with activities of daily living, pain with passive range of motion and crepitus. Patient has evidence of subchondral cysts, subchondral sclerosis, periarticular osteophytes and joint space narrowing by imaging studies. This condition presents safety issues increasing the risk of falls.   There is no current active infection.  Patient Active Problem List   Diagnosis Date Noted  . Dilated cardiomyopathy ([place]) 05/07/2013  . Personal history of colonic adenomas 11/04/2012  . HYPERLIPIDEMIA-MIXED 06/16/2010  . Essential hypertension 05/27/2010  . LEFT HEART FAILURE 05/27/2010   Past Medical History:  Diagnosis Date  . Chronic systolic CHF (congestive heart failure), NYHA class 1 ([place])   . History of echocardiogram    Echo 3/16: Mild LVH, EF 40-45%, anteroseptal akinesis, grade 1 diastolic dysfunction, moderate LAE // Echo 3/19: diff HK worse in basal and mid inf wall, mild LVH, EF 25-30, mild MR, mild LAE  . Hyperlipidemia   . Hypertension   . Neuropathy of hand   . Non-ischemic cardiomyopathy ([place])    a. Echo 6/11: 30-35% // b. LHC 7/11: EF 30-35% with  diffuse hypokinesis, no angiographic CAD. SPEP and Fe studies unremarkable, TSH normal.//  c. Echo 1/12: EF 45% // d. Echo 6/14: EF 20-25%  //  e. Echo EF 25-30%, diffuse hypokinesis, prominent apical trabeculation // f. CPX 3/15: normal fxnal capacity  // g. CPX 3/16 low norma fxnal capacity  // h. Echo 3/16: mild LVH, EF 40-45%   . Obesity   . PVC (premature ventricular contraction)   . Rosacea   . Tubular adenoma of colon 12/2010    Past Surgical History:  Procedure Laterality Date  . ARM SURGERY  1967   RIGHT   . COLONOSCOPY    . COLONOSCOPY WITH PROPOFOL N/A 08/28/2019   Procedure: COLONOSCOPY WITH PROPOFOL;  Surgeon: Gatha Mayer, MD;  [facility];  Service: Endoscopy;  Laterality: N/A;  . CYST EXCISION Left 07/29/2019   Procedure: EXCISION OF CHRONIC LEFT LOWER BACK CYST;  Surgeon: Coralie Keens, MD;  Location: [place];  Service: General;  Laterality: Left;  . KNEE ARTHROSCOPY Left   . RIGHT/LEFT HEART CATH AND CORONARY ANGIOGRAPHY N/A 03/09/2018   Procedure: RIGHT/LEFT HEART CATH AND CORONARY ANGIOGRAPHY;  Surgeon: Larey Dresser, MD;  Location: [place] [facility];  Service: Cardiovascular;  Laterality: N/A;    Current Outpatient Medications  Medication Sig Dispense Refill Last Dose  . aspirin 81 MG tablet Take 81 mg by mouth daily.     . carvedilol (COREG) 25 MG tablet TAKE 1 TABLET BY MOUTH TWICE A DAY (Patient taking differently: Take 25 mg by mouth 2 (two) times daily with a meal. ) 180 tablet 1   . ENTRESTO 97-103 MG TAKE  1 TABLET BY MOUTH TWICE A DAY (Patient taking differently: Take 1 tablet by mouth 2 (two) times daily. ) 60 tablet 6   . isosorbide-hydrALAZINE (BIDIL) 20-37.5 MG tablet Take 1 tablet by mouth 3 (three) times daily. 30 tablet 3   . Multiple Vitamin (MULTIVITAMIN WITH MINERALS) TABS tablet Take 1 tablet by mouth daily.     . simvastatin (ZOCOR) 40 MG tablet Take 40 mg by mouth daily.      Marland Kitchen spironolactone (ALDACTONE) 25 MG tablet Take 1 tablet  (25 mg total) by mouth daily. 30 tablet 6    No current facility-administered medications for this visit.   No Known Allergies  Social History   Tobacco Use  . Smoking status: Former Smoker    Types: Cigarettes    Quit date: 03/12/2002    Years since quitting: 17.7  . Smokeless tobacco: Never Used  Substance Use Topics  . Alcohol use: Yes    Comment: Socially    Family History  Problem Relation Age of Onset  . CVA Mother   . Heart attack Other   . Gout Son   . Colon cancer Neg Hx      Review of Systems  Constitutional: Negative.   HENT: Negative.   Eyes: Negative.   Respiratory: Negative.   Cardiovascular: Negative.   Gastrointestinal: Negative.   Endocrine: Negative.   Genitourinary: Positive for difficulty urinating, enuresis and frequency.  Musculoskeletal: Positive for arthralgias, back pain and gait problem.  Skin: Negative.   Allergic/Immunologic: Negative.   Hematological: Negative.   Psychiatric/Behavioral: Negative.     Objective:  Physical Exam  Vital signs in last 24 hours: @VSRANGES @  Labs:   Estimated body mass index is 36.98 kg/m as calculated from the following:   Height as of 11/20/19: 5\' 9"  (1.753 m).   Weight as of 11/20/19: 113.6 kg.   Imaging Review Plain radiographs demonstrate severe degenerative joint disease of the right hip(s). The bone quality appears to be adequate for age and reported activity level.      Assessment/Plan:  End stage arthritis, right hip(s)  The patient history, physical examination, clinical judgement of the provider and imaging studies are consistent with end stage degenerative joint disease of the right hip(s) and total hip arthroplasty is deemed medically necessary. The treatment options including medical management, injection therapy, arthroscopy and arthroplasty were discussed at length. The risks and benefits of total hip arthroplasty were presented and reviewed. The risks due to aseptic loosening,  infection, stiffness, dislocation/subluxation,  thromboembolic complications and other imponderables were discussed.  The patient acknowledged the explanation, agreed to proceed with the plan and consent was signed. Patient is being admitted for inpatient treatment for surgery, pain control, PT, OT, prophylactic antibiotics, VTE prophylaxis, progressive ambulation and ADL's and discharge planning.The patient is planning to be discharged home with HEP  Plan for overnight OBS due to cardiac issues.

## 2019-11-27 ENCOUNTER — Encounter (HOSPITAL_COMMUNITY)
Admission: RE | Disposition: A | Discharge status: Home / Self Care | Payer: Self-pay | Source: Home / Self Care | Attending: Orthopedic Surgery

## 2019-11-27 ENCOUNTER — Ambulatory Visit (HOSPITAL_COMMUNITY): Payer: PPO

## 2019-11-27 ENCOUNTER — Other Ambulatory Visit: Payer: Self-pay

## 2019-11-27 ENCOUNTER — Observation Stay (HOSPITAL_COMMUNITY)
Admission: RE | Admit: 2019-11-27 | Discharge: 2019-11-28 | Disposition: A | Discharge status: Home / Self Care | Payer: PPO | Attending: Orthopedic Surgery | Admitting: Orthopedic Surgery

## 2019-11-27 ENCOUNTER — Ambulatory Visit (HOSPITAL_COMMUNITY): Payer: PPO | Admitting: Physician Assistant

## 2019-11-27 ENCOUNTER — Encounter (HOSPITAL_COMMUNITY): Payer: Self-pay | Admitting: Orthopedic Surgery

## 2019-11-27 ENCOUNTER — Ambulatory Visit (HOSPITAL_COMMUNITY): Payer: PPO | Admitting: Anesthesiology

## 2019-11-27 DIAGNOSIS — M25751 Osteophyte, right hip: Secondary | ICD-10-CM | POA: Diagnosis not present

## 2019-11-27 DIAGNOSIS — Z87891 Personal history of nicotine dependence: Secondary | ICD-10-CM | POA: Insufficient documentation

## 2019-11-27 DIAGNOSIS — Z8249 Family history of ischemic heart disease and other diseases of the circulatory system: Secondary | ICD-10-CM | POA: Insufficient documentation

## 2019-11-27 DIAGNOSIS — Z96641 Presence of right artificial hip joint: Secondary | ICD-10-CM | POA: Diagnosis not present

## 2019-11-27 DIAGNOSIS — I5022 Chronic systolic (congestive) heart failure: Secondary | ICD-10-CM | POA: Insufficient documentation

## 2019-11-27 DIAGNOSIS — M1611 Unilateral primary osteoarthritis, right hip: Principal | ICD-10-CM | POA: Insufficient documentation

## 2019-11-27 DIAGNOSIS — E782 Mixed hyperlipidemia: Secondary | ICD-10-CM | POA: Diagnosis not present

## 2019-11-27 DIAGNOSIS — Z7982 Long term (current) use of aspirin: Secondary | ICD-10-CM | POA: Insufficient documentation

## 2019-11-27 DIAGNOSIS — Z6836 Body mass index (BMI) 36.0-36.9, adult: Secondary | ICD-10-CM | POA: Diagnosis not present

## 2019-11-27 DIAGNOSIS — Z09 Encounter for follow-up examination after completed treatment for conditions other than malignant neoplasm: Secondary | ICD-10-CM

## 2019-11-27 DIAGNOSIS — Z79899 Other long term (current) drug therapy: Secondary | ICD-10-CM | POA: Insufficient documentation

## 2019-11-27 DIAGNOSIS — I420 Dilated cardiomyopathy: Secondary | ICD-10-CM | POA: Insufficient documentation

## 2019-11-27 DIAGNOSIS — Z471 Aftercare following joint replacement surgery: Secondary | ICD-10-CM | POA: Diagnosis not present

## 2019-11-27 DIAGNOSIS — E669 Obesity, unspecified: Secondary | ICD-10-CM | POA: Diagnosis not present

## 2019-11-27 DIAGNOSIS — I110 Hypertensive heart disease with heart failure: Secondary | ICD-10-CM | POA: Insufficient documentation

## 2019-11-27 DIAGNOSIS — Z419 Encounter for procedure for purposes other than remedying health state, unspecified: Secondary | ICD-10-CM

## 2019-11-27 HISTORY — PX: TOTAL HIP ARTHROPLASTY: SHX124

## 2019-11-27 LAB — TYPE AND SCREEN
ABO/RH(D): O POS
Antibody Screen: NEGATIVE

## 2019-11-27 SURGERY — ARTHROPLASTY, HIP, TOTAL, ANTERIOR APPROACH
Anesthesia: Spinal | Site: Hip | Laterality: Right

## 2019-11-27 MED ORDER — CHLORHEXIDINE GLUCONATE 4 % EX LIQD: 60.0000 mL | Freq: Once | CUTANEOUS | Status: DC

## 2019-11-27 MED ORDER — SODIUM CHLORIDE (PF) 0.9 % IJ SOLN
INTRAMUSCULAR | Status: AC
  Filled 2019-11-27: qty 50

## 2019-11-27 MED ORDER — PROPOFOL 500 MG/50ML IV EMUL
INTRAVENOUS | Status: DC | PRN
  Administered 2019-11-27: 50 mg via INTRAVENOUS

## 2019-11-27 MED ORDER — CEFAZOLIN SODIUM-DEXTROSE 2-4 GM/100ML-% IV SOLN
2.0000 g | INTRAVENOUS | Status: AC
  Administered 2019-11-27: 2 g via INTRAVENOUS
  Filled 2019-11-27: qty 100

## 2019-11-27 MED ORDER — GLYCOPYRROLATE 0.2 MG/ML IJ SOLN
INTRAMUSCULAR | Status: AC
  Filled 2019-11-27: qty 1

## 2019-11-27 MED ORDER — PROPOFOL 500 MG/50ML IV EMUL
INTRAVENOUS | Status: AC
  Filled 2019-11-27: qty 100

## 2019-11-27 MED ORDER — ACETAMINOPHEN 325 MG PO TABS: 325.0000 mg | ORAL_TABLET | Freq: Four times a day (QID) | ORAL | Status: DC | PRN

## 2019-11-27 MED ORDER — METHOCARBAMOL 500 MG PO TABS
500.0000 mg | ORAL_TABLET | Freq: Four times a day (QID) | ORAL | Status: DC | PRN
  Administered 2019-11-27: 500 mg via ORAL
  Filled 2019-11-27: qty 1

## 2019-11-27 MED ORDER — PHENYLEPHRINE 40 MCG/ML (10ML) SYRINGE FOR IV PUSH (FOR BLOOD PRESSURE SUPPORT)
PREFILLED_SYRINGE | INTRAVENOUS | Status: DC | PRN
  Administered 2019-11-27 (×2): 80 ug via INTRAVENOUS

## 2019-11-27 MED ORDER — CELECOXIB 200 MG PO CAPS
200.0000 mg | ORAL_CAPSULE | Freq: Two times a day (BID) | ORAL | Status: DC
  Administered 2019-11-27 – 2019-11-28 (×2): 200 mg via ORAL
  Filled 2019-11-27 (×2): qty 1

## 2019-11-27 MED ORDER — DEXAMETHASONE SODIUM PHOSPHATE 10 MG/ML IJ SOLN
INTRAMUSCULAR | Status: AC
  Filled 2019-11-27: qty 2

## 2019-11-27 MED ORDER — BUPIVACAINE HCL (PF) 0.25 % IJ SOLN
INTRAMUSCULAR | Status: DC | PRN
  Administered 2019-11-27: 30 mL

## 2019-11-27 MED ORDER — FENTANYL CITRATE (PF) 100 MCG/2ML IJ SOLN: 25.0000 ug | INTRAMUSCULAR | Status: DC | PRN

## 2019-11-27 MED ORDER — ASPIRIN 81 MG PO CHEW
81.0000 mg | CHEWABLE_TABLET | Freq: Two times a day (BID) | ORAL | Status: DC
  Administered 2019-11-27 – 2019-11-28 (×2): 81 mg via ORAL
  Filled 2019-11-27 (×2): qty 1

## 2019-11-27 MED ORDER — DEXAMETHASONE SODIUM PHOSPHATE 10 MG/ML IJ SOLN
10.0000 mg | Freq: Once | INTRAMUSCULAR | Status: AC
  Administered 2019-11-28: 10 mg via INTRAVENOUS
  Filled 2019-11-27: qty 1

## 2019-11-27 MED ORDER — SODIUM CHLORIDE 0.9 % IV SOLN: INTRAVENOUS | Status: DC

## 2019-11-27 MED ORDER — ACETAMINOPHEN 10 MG/ML IV SOLN: 1000.0000 mg | Freq: Once | INTRAVENOUS | Status: DC | PRN

## 2019-11-27 MED ORDER — ISOSORB DINITRATE-HYDRALAZINE 20-37.5 MG PO TABS
1.0000 | ORAL_TABLET | Freq: Three times a day (TID) | ORAL | Status: DC
  Administered 2019-11-27 (×2): 1 via ORAL
  Filled 2019-11-27 (×5): qty 1

## 2019-11-27 MED ORDER — PHENOL 1.4 % MT LIQD
1.0000 | OROMUCOSAL | Status: DC | PRN
  Filled 2019-11-27: qty 177

## 2019-11-27 MED ORDER — POVIDONE-IODINE 10 % EX SWAB: 2.0000 "application " | Freq: Once | CUTANEOUS | Status: DC

## 2019-11-27 MED ORDER — SODIUM CHLORIDE (PF) 0.9 % IJ SOLN
INTRAMUSCULAR | Status: DC | PRN
  Administered 2019-11-27: 30 mL

## 2019-11-27 MED ORDER — ISOPROPYL ALCOHOL 70 % SOLN
Status: DC | PRN
  Administered 2019-11-27: 1 via TOPICAL

## 2019-11-27 MED ORDER — DEXAMETHASONE SODIUM PHOSPHATE 10 MG/ML IJ SOLN
INTRAMUSCULAR | Status: DC | PRN
  Administered 2019-11-27: 10 mg via INTRAVENOUS

## 2019-11-27 MED ORDER — KETOROLAC TROMETHAMINE 30 MG/ML IJ SOLN
INTRAMUSCULAR | Status: AC
  Filled 2019-11-27: qty 1

## 2019-11-27 MED ORDER — GLYCOPYRROLATE 0.2 MG/ML IJ SOLN
INTRAMUSCULAR | Status: DC | PRN
  Administered 2019-11-27: .2 mg via INTRAVENOUS

## 2019-11-27 MED ORDER — METHOCARBAMOL 500 MG IVPB - SIMPLE MED
500.0000 mg | Freq: Four times a day (QID) | INTRAVENOUS | Status: DC | PRN
  Filled 2019-11-27: qty 50

## 2019-11-27 MED ORDER — CEFAZOLIN SODIUM-DEXTROSE 2-4 GM/100ML-% IV SOLN
2.0000 g | Freq: Four times a day (QID) | INTRAVENOUS | Status: AC
  Administered 2019-11-27 (×2): 2 g via INTRAVENOUS
  Filled 2019-11-27 (×2): qty 100

## 2019-11-27 MED ORDER — METOCLOPRAMIDE HCL 5 MG PO TABS: 5.0000 mg | ORAL_TABLET | Freq: Three times a day (TID) | ORAL | Status: DC | PRN

## 2019-11-27 MED ORDER — PROPOFOL 500 MG/50ML IV EMUL
INTRAVENOUS | Status: DC | PRN
  Administered 2019-11-27: 100 ug/kg/min via INTRAVENOUS

## 2019-11-27 MED ORDER — ONDANSETRON HCL 4 MG/2ML IJ SOLN: 4.0000 mg | Freq: Four times a day (QID) | INTRAMUSCULAR | Status: DC | PRN

## 2019-11-27 MED ORDER — POLYETHYLENE GLYCOL 3350 17 G PO PACK: 17.0000 g | PACK | Freq: Every day | ORAL | Status: DC | PRN

## 2019-11-27 MED ORDER — MIDAZOLAM HCL 2 MG/2ML IJ SOLN
INTRAMUSCULAR | Status: DC | PRN
  Administered 2019-11-27: 2 mg via INTRAVENOUS

## 2019-11-27 MED ORDER — LACTATED RINGERS IV SOLN: INTRAVENOUS | Status: DC

## 2019-11-27 MED ORDER — ADULT MULTIVITAMIN W/MINERALS CH
1.0000 | ORAL_TABLET | Freq: Every day | ORAL | Status: DC
  Administered 2019-11-27 – 2019-11-28 (×2): 1 via ORAL
  Filled 2019-11-27 (×2): qty 1

## 2019-11-27 MED ORDER — DIPHENHYDRAMINE HCL 12.5 MG/5ML PO ELIX: 12.5000 mg | ORAL_SOLUTION | ORAL | Status: DC | PRN

## 2019-11-27 MED ORDER — SIMVASTATIN 20 MG PO TABS
40.0000 mg | ORAL_TABLET | Freq: Every day | ORAL | Status: DC
  Administered 2019-11-27 – 2019-11-28 (×2): 40 mg via ORAL
  Filled 2019-11-27 (×2): qty 2

## 2019-11-27 MED ORDER — WATER FOR IRRIGATION, STERILE IR SOLN
Status: DC | PRN
  Administered 2019-11-27: 2000 mL

## 2019-11-27 MED ORDER — BUPIVACAINE HCL 0.25 % IJ SOLN
INTRAMUSCULAR | Status: AC
  Filled 2019-11-27: qty 1

## 2019-11-27 MED ORDER — ONDANSETRON HCL 4 MG PO TABS
4.0000 mg | ORAL_TABLET | Freq: Four times a day (QID) | ORAL | Status: DC | PRN
  Administered 2019-11-28: 4 mg via ORAL
  Filled 2019-11-27: qty 1

## 2019-11-27 MED ORDER — BUPIVACAINE IN DEXTROSE 0.75-8.25 % IT SOLN
INTRATHECAL | Status: DC | PRN
  Administered 2019-11-27: 1.7 mL via INTRATHECAL

## 2019-11-27 MED ORDER — HYDROCODONE-ACETAMINOPHEN 5-325 MG PO TABS
1.0000 | ORAL_TABLET | ORAL | Status: DC | PRN
  Administered 2019-11-27 – 2019-11-28 (×3): 1 via ORAL
  Filled 2019-11-27 (×3): qty 1

## 2019-11-27 MED ORDER — HYDROCODONE-ACETAMINOPHEN 7.5-325 MG PO TABS: 1.0000 | ORAL_TABLET | ORAL | Status: DC | PRN

## 2019-11-27 MED ORDER — KETOROLAC TROMETHAMINE 30 MG/ML IJ SOLN
INTRAMUSCULAR | Status: DC | PRN
  Administered 2019-11-27: 30 mg

## 2019-11-27 MED ORDER — CARVEDILOL 25 MG PO TABS
25.0000 mg | ORAL_TABLET | Freq: Two times a day (BID) | ORAL | Status: DC
  Administered 2019-11-27 – 2019-11-28 (×2): 25 mg via ORAL
  Filled 2019-11-27 (×2): qty 1

## 2019-11-27 MED ORDER — EPHEDRINE SULFATE-NACL 50-0.9 MG/10ML-% IV SOSY
PREFILLED_SYRINGE | INTRAVENOUS | Status: DC | PRN
  Administered 2019-11-27 (×6): 10 mg via INTRAVENOUS

## 2019-11-27 MED ORDER — DOCUSATE SODIUM 100 MG PO CAPS
100.0000 mg | ORAL_CAPSULE | Freq: Two times a day (BID) | ORAL | Status: DC
  Administered 2019-11-27 – 2019-11-28 (×2): 100 mg via ORAL
  Filled 2019-11-27 (×2): qty 1

## 2019-11-27 MED ORDER — SODIUM CHLORIDE 0.9 % IR SOLN
Status: DC | PRN
  Administered 2019-11-27: 1000 mL

## 2019-11-27 MED ORDER — TRANEXAMIC ACID-NACL 1000-0.7 MG/100ML-% IV SOLN
1000.0000 mg | INTRAVENOUS | Status: AC
  Administered 2019-11-27: 1000 mg via INTRAVENOUS
  Filled 2019-11-27: qty 100

## 2019-11-27 MED ORDER — MIDAZOLAM HCL 2 MG/2ML IJ SOLN
INTRAMUSCULAR | Status: AC
  Filled 2019-11-27: qty 2

## 2019-11-27 MED ORDER — LIDOCAINE 2% (20 MG/ML) 5 ML SYRINGE
INTRAMUSCULAR | Status: AC
  Filled 2019-11-27: qty 5

## 2019-11-27 MED ORDER — MORPHINE SULFATE (PF) 2 MG/ML IV SOLN: 0.5000 mg | INTRAVENOUS | Status: DC | PRN

## 2019-11-27 MED ORDER — EPHEDRINE 5 MG/ML INJ
INTRAVENOUS | Status: AC
  Filled 2019-11-27: qty 10

## 2019-11-27 MED ORDER — METOCLOPRAMIDE HCL 5 MG/ML IJ SOLN: 5.0000 mg | Freq: Three times a day (TID) | INTRAMUSCULAR | Status: DC | PRN

## 2019-11-27 MED ORDER — PHENYLEPHRINE 40 MCG/ML (10ML) SYRINGE FOR IV PUSH (FOR BLOOD PRESSURE SUPPORT)
PREFILLED_SYRINGE | INTRAVENOUS | Status: AC
  Filled 2019-11-27: qty 20

## 2019-11-27 MED ORDER — ALUM & MAG HYDROXIDE-SIMETH 200-200-20 MG/5ML PO SUSP: 30.0000 mL | ORAL | Status: DC | PRN

## 2019-11-27 MED ORDER — SENNA 8.6 MG PO TABS
1.0000 | ORAL_TABLET | Freq: Two times a day (BID) | ORAL | Status: DC
  Administered 2019-11-27 – 2019-11-28 (×2): 8.6 mg via ORAL
  Filled 2019-11-27 (×2): qty 1

## 2019-11-27 MED ORDER — ONDANSETRON HCL 4 MG/2ML IJ SOLN
INTRAMUSCULAR | Status: DC | PRN
  Administered 2019-11-27: 4 mg via INTRAVENOUS

## 2019-11-27 MED ORDER — POVIDONE-IODINE 10 % EX SWAB
2.0000 "application " | Freq: Once | CUTANEOUS | Status: AC
  Administered 2019-11-27: 2 via TOPICAL

## 2019-11-27 MED ORDER — MENTHOL 3 MG MT LOZG: 1.0000 | LOZENGE | OROMUCOSAL | Status: DC | PRN

## 2019-11-27 MED ORDER — LIDOCAINE 2% (20 MG/ML) 5 ML SYRINGE
INTRAMUSCULAR | Status: AC
  Filled 2019-11-27: qty 10

## 2019-11-27 MED ORDER — ONDANSETRON HCL 4 MG/2ML IJ SOLN: 4.0000 mg | Freq: Once | INTRAMUSCULAR | Status: DC | PRN

## 2019-11-27 SURGICAL SUPPLY — 56 items
BAG DECANTER FOR FLEXI CONT (MISCELLANEOUS) IMPLANT
BAG ZIPLOCK 12X15 (MISCELLANEOUS) IMPLANT
BLADE SURG SZ10 CARB STEEL (BLADE) IMPLANT
CHLORAPREP W/TINT 26 (MISCELLANEOUS) ×3 IMPLANT
COVER PERINEAL POST (MISCELLANEOUS) ×3 IMPLANT
COVER SURGICAL LIGHT HANDLE (MISCELLANEOUS) ×3 IMPLANT
COVER WAND RF STERILE (DRAPES) IMPLANT
CUP SECTOR GRIPTON 58MM (Orthopedic Implant) ×3 IMPLANT
DECANTER SPIKE VIAL GLASS SM (MISCELLANEOUS) ×3 IMPLANT
DERMABOND ADVANCED (GAUZE/BANDAGES/DRESSINGS) ×2
DERMABOND ADVANCED .7 DNX12 (GAUZE/BANDAGES/DRESSINGS) ×1 IMPLANT
DRAPE IMP U-DRAPE 54X76 (DRAPES) ×3 IMPLANT
DRAPE SHEET LG 3/4 BI-LAMINATE (DRAPES) ×9 IMPLANT
DRAPE STERI IOBAN 125X83 (DRAPES) IMPLANT
DRAPE U-SHAPE 47X51 STRL (DRAPES) ×6 IMPLANT
DRSG AQUACEL AG ADV 3.5X10 (GAUZE/BANDAGES/DRESSINGS) ×3 IMPLANT
ELECT REM PT RETURN 15FT ADLT (MISCELLANEOUS) ×3 IMPLANT
GAUZE SPONGE 4X4 12PLY STRL (GAUZE/BANDAGES/DRESSINGS) ×3 IMPLANT
GLOVE BIO SURGEON STRL SZ8.5 (GLOVE) ×6 IMPLANT
GLOVE BIOGEL PI IND STRL 8.5 (GLOVE) ×1 IMPLANT
GLOVE BIOGEL PI INDICATOR 8.5 (GLOVE) ×2
GOWN SPEC L3 XXLG W/TWL (GOWN DISPOSABLE) ×3 IMPLANT
HANDPIECE INTERPULSE COAX TIP (DISPOSABLE) ×2
HEAD CERAMIC 36 PLUS5 (Hips) ×3 IMPLANT
HOLDER FOLEY CATH W/STRAP (MISCELLANEOUS) ×3 IMPLANT
HOOD PEEL AWAY FLYTE STAYCOOL (MISCELLANEOUS) ×12 IMPLANT
JET LAVAGE IRRISEPT WOUND (IRRIGATION / IRRIGATOR) ×3
KIT TURNOVER KIT A (KITS) IMPLANT
LAVAGE JET IRRISEPT WOUND (IRRIGATION / IRRIGATOR) ×1 IMPLANT
LINER NEUTRAL 52X36X58N (Liner) ×3 IMPLANT
MANIFOLD NEPTUNE II (INSTRUMENTS) ×3 IMPLANT
MARKER SKIN DUAL TIP RULER LAB (MISCELLANEOUS) ×3 IMPLANT
NDL SAFETY ECLIPSE 18X1.5 (NEEDLE) ×1 IMPLANT
NEEDLE HYPO 18GX1.5 SHARP (NEEDLE) ×2
NEEDLE SPNL 18GX3.5 QUINCKE PK (NEEDLE) ×3 IMPLANT
PACK ANTERIOR HIP CUSTOM (KITS) ×3 IMPLANT
PENCIL SMOKE EVACUATOR (MISCELLANEOUS) IMPLANT
SAW OSC TIP CART 19.5X105X1.3 (SAW) ×3 IMPLANT
SEALER BIPOLAR AQUA 6.0 (INSTRUMENTS) ×3 IMPLANT
SET HNDPC FAN SPRY TIP SCT (DISPOSABLE) ×1 IMPLANT
SLEEVE SUCTION CATH 165 (SLEEVE) ×3 IMPLANT
STEM TRI LOC BPS GRIPTON SZ 5 (Hips) ×1 IMPLANT
SUT ETHIBOND NAB CT1 #1 30IN (SUTURE) ×6 IMPLANT
SUT MNCRL AB 3-0 PS2 18 (SUTURE) ×3 IMPLANT
SUT MNCRL AB 4-0 PS2 18 (SUTURE) ×3 IMPLANT
SUT MON AB 2-0 CT1 36 (SUTURE) ×6 IMPLANT
SUT STRATAFIX PDO 1 14 VIOLET (SUTURE) ×2
SUT STRATFX PDO 1 14 VIOLET (SUTURE) ×1
SUT VIC AB 2-0 CT1 27 (SUTURE) ×2
SUT VIC AB 2-0 CT1 TAPERPNT 27 (SUTURE) ×1 IMPLANT
SUTURE STRATFX PDO 1 14 VIOLET (SUTURE) ×1 IMPLANT
SYR 3ML LL SCALE MARK (SYRINGE) ×3 IMPLANT
TRAY FOLEY MTR SLVR 16FR STAT (SET/KITS/TRAYS/PACK) IMPLANT
TRI LOC BPS W GRIPTON SZ 5 (Hips) ×3 IMPLANT
WATER STERILE IRR 1000ML POUR (IV SOLUTION) ×3 IMPLANT
YANKAUER SUCT BULB TIP 10FT TU (MISCELLANEOUS) ×3 IMPLANT

## 2019-11-27 NOTE — Op Note (Signed)
OPERATIVE REPORT  SURGEON: Rod Can, MD   ASSISTANT: Nehemiah Massed, PA-C.  PREOPERATIVE DIAGNOSIS: Right hip arthritis.   POSTOPERATIVE DIAGNOSIS: Right hip arthritis.   PROCEDURE: Right total hip arthroplasty, anterior approach.   IMPLANTS: DePuy Tri Lock stem, size 5, hi offset. DePuy Pinnacle Cup, size 58 mm. DePuy Altrx liner, size 36 by 58 mm, neutral. DePuy Biolox ceramic head ball, size 36 + 5 mm.  ANESTHESIA:  MAC and Spinal  ESTIMATED BLOOD LOSS:-350 mL    ANTIBIOTICS: 2 g Ancef.  DRAINS: None.  COMPLICATIONS: None.   CONDITION: PACU - hemodynamically stable.   BRIEF CLINICAL NOTE: Kevin Murillo is a 73 y.o. male with a long-standing history of Right hip arthritis. After failing conservative management, the patient was indicated for total hip arthroplasty. The risks, benefits, and alternatives to the procedure were explained, and the patient elected to proceed.  PROCEDURE IN DETAIL: Surgical site was marked by myself in the pre-op holding area. Once inside the operating room, spinal anesthesia was obtained, and a foley catheter was inserted. The patient was then positioned on the Hana table.  All bony prominences were well padded.  The hip was prepped and draped in the normal sterile surgical fashion.  A time-out was called verifying side and site of surgery. The patient received IV antibiotics within 60 minutes of beginning the procedure.   The direct anterior approach to the hip was performed through the Hueter interval.  Lateral femoral circumflex vessels were treated with the Auqumantys. The anterior capsule was exposed and an inverted T capsulotomy was made. The femoral neck cut was made to the level of the templated cut.  A corkscrew was placed into the head and the head was removed.  The femoral head was found to have eburnated bone. The head was passed to the back table and was measured.   Acetabular exposure was achieved, and the pulvinar and  labrum were excised. Sequential reaming of the acetabulum was then performed up to a size 57 mm reamer. A 58 mm cup was then opened and impacted into place at approximately 40 degrees of abduction and 20 degrees of anteversion. The final polyethylene liner was impacted into place and acetabular osteophytes were removed.    I then gained femoral exposure taking care to protect the abductors and greater trochanter.  This was performed using standard external rotation, extension, and adduction.  The capsule was peeled off the inner aspect of the greater trochanter, taking care to preserve the short external rotators. A cookie cutter was used to enter the femoral canal, and then the femoral canal finder was placed.  Sequential broaching was performed up to a size 5.  Calcar planer was used on the femoral neck remnant.  I placed a hi offset neck and a trial head ball.  The hip was reduced.  Leg lengths and offset were checked fluoroscopically.  The hip was dislocated and trial components were removed.  The final implants were placed, and the hip was reduced.  Fluoroscopy was used to confirm component position and leg lengths.  At 90 degrees of external rotation and full extension, the hip was stable to an anterior directed force.   The wound was copiously irrigated with Irrisept solution and normal saline using pule lavage.  Marcaine solution was injected into the periarticular soft tissue.  The wound was closed in layers using #1 Stratafix for the fascia, 2-0 Vicryl for the subcutaneous fat, 2-0 Monocryl for the deep dermal layer, 3-0 running Monocryl subcuticular  stitch, and Dermabond for the skin.  Once the glue was fully dried, an Aquacell Ag dressing was applied.  The patient was transported to the recovery room in stable condition.  Sponge, needle, and instrument counts were correct at the end of the case x2.  The patient tolerated the procedure well and there were no known complications.  Please note that a  surgical assistant was a medical necessity for this procedure to perform it in a safe and expeditious manner. Assistant was necessary to provide appropriate retraction of vital neurovascular structures, to prevent femoral fracture, and to allow for anatomic placement of the prosthesis.

## 2019-11-27 NOTE — Interval H&P Note (Signed)
History and Physical Interval Note:  11/27/2019 9:48 AM  Kevin Murillo  has presented today for surgery, with the diagnosis of Degenerative joint disease right hip.  The various methods of treatment have been discussed with the patient and family. After consideration of risks, benefits and other options for treatment, the patient has consented to  Procedure(s): TOTAL HIP ARTHROPLASTY ANTERIOR APPROACH (Right) as a surgical intervention.  The patient's history has been reviewed, patient examined, no change in status, stable for surgery.  I have reviewed the patient's chart and labs.  Questions were answered to the patient's satisfaction.     Kevin Murillo [name]

## 2019-11-27 NOTE — Evaluation (Signed)
Physical Therapy Evaluation Patient Details Name: Kevin Murillo MRN: [id number] DOB: 07-05-1946 Today's Date: 11/27/2019   History of Present Illness  Patient is 73 y.o. male s/p Rt THA anterior approach on 11/27/19 with PMH of HTN, HLD, CHF.  Clinical Impression  Kevin Murillo is a 73 y.o. male POD 0 s/p Rt THA anterior approach. Patient reports modified independence with SPC and RW for mobility at baseline. Patient is now limited by functional impairments (see PT problem list below) and requires min assist for transfers and gait with RW. Patient was able to ambulate ~80 feet with RW and min assist for safe walker management. Patient instructed in exercise to facilitate ROM and circulation. Patient will benefit from continued skilled PT interventions to address impairments and progress towards PLOF. Acute PT will follow to progress mobility and stair training in preparation for safe discharge home.    Follow Up Recommendations Follow surgeon's recommendation for DC plan and follow-up therapies    Equipment Recommendations  None recommended by PT(educated pt and his son on how to obtain [facility] or shower seat)    Recommendations for Other Services       Precautions / Restrictions Precautions Precautions: Fall Restrictions Weight Bearing Restrictions: No      Mobility  Bed Mobility Overal bed mobility: Needs Assistance Bed Mobility: Supine to Sit     Supine to sit: Min assist;HOB elevated     General bed mobility comments: cues to use bed rail, assist to raise trunk upright, no assist required for LE mobility  Transfers Overall transfer level: Needs assistance Equipment used: Rolling walker (2 wheeled) Transfers: Sit to/from Stand Sit to Stand: Min assist;From elevated surface         General transfer comment: verbal cues for safe hand placement and technique with RW, light assist to steady with rise, pt performed power up without  assist  Ambulation/Gait Ambulation/Gait assistance: Min assist Gait Distance (Feet): 80 Feet Assistive device: Rolling walker (2 wheeled) Gait Pattern/deviations: Step-through pattern;Trunk flexed Gait velocity: decreased   General Gait Details: ceus for safe proximity to RW and for safe hand placement, min assist intermittently for walker positioning and cues for step sequencing through turn  Stairs            Wheelchair Mobility    Modified Rankin (Stroke Patients Only)       Balance Overall balance assessment: Needs assistance Sitting-balance support: Feet supported Sitting balance-Leahy Scale: Good     Standing balance support: During functional activity;Bilateral upper extremity supported Standing balance-Leahy Scale: Fair              Pertinent Vitals/Pain Pain Assessment: No/denies pain    Home Living Family/patient expects to be discharged to:: Private residence Living Arrangements: Alone Available Help at Discharge: Family;Available 24 hours/day(pt's son will be staying with him for 2 weeks) Type of Home: House Home Access: Stairs to enter Entrance Stairs-Rails: Left Entrance Stairs-Number of Steps: 3 Home Layout: Two level;Bed/bath upstairs;1/2 bath on main level Home Equipment: Walker - 2 wheels;Walker - 4 wheels;Cane - single point Additional Comments: pt has half bath on main level with tub but no shower in it    Prior Function Level of Independence: Independent with assistive device(s)         Comments: pt was mobilizing with [facility] for short distances and RW or rollator for longer distances outside of home; pt was still driving and shopping for self and doing all ADL's     Hand Dominance  Dominant Hand: Right    Extremity/Trunk Assessment   Upper Extremity Assessment Upper Extremity Assessment: Overall WFL for tasks assessed    Lower Extremity Assessment Lower Extremity Assessment: Overall WFL for tasks assessed;RLE  deficits/detail RLE Deficits / Details: good quad activation, 4+5 for quad strength with MMT RLE Sensation: WNL RLE Coordination: WNL    Cervical / Trunk Assessment Cervical / Trunk Assessment: Normal  Communication   Communication: No difficulties  Cognition Arousal/Alertness: Awake/alert Behavior During Therapy: WFL for tasks assessed/performed Overall Cognitive Status: Within Functional Limits for tasks assessed               General Comments      Exercises Total Joint Exercises Ankle Circles/Pumps: AROM;15 reps;Seated;Both Long Arc Quad: AROM;10 reps;Seated;Right   Assessment/Plan    PT Assessment Patient needs continued PT services  PT Problem List Decreased strength;Decreased activity tolerance;Decreased mobility;Decreased balance;Decreased range of motion;Decreased knowledge of use of DME       PT Treatment Interventions DME instruction;Functional mobility training;Balance training;Patient/family education;Therapeutic activities;Gait training;Stair training;Therapeutic exercise    PT Goals (Current goals can be found in the Care Plan section)  Acute Rehab PT Goals Patient Stated Goal: to return home PT Goal Formulation: With patient Time For Goal Achievement: 12/04/19 Potential to Achieve Goals: Good    Frequency 7X/week    AM-PAC PT "6 Clicks" Mobility  Outcome Measure Help needed turning from your back to your side while in a flat bed without using bedrails?: A Little Help needed moving from lying on your back to sitting on the side of a flat bed without using bedrails?: A Little Help needed moving to and from a bed to a chair (including a wheelchair)?: A Little Help needed standing up from a chair using your arms (e.g., wheelchair or bedside chair)?: A Little Help needed to walk in hospital room?: A Little Help needed climbing 3-5 steps with a railing? : A Little 6 Click Score: 18    End of Session Equipment Utilized During Treatment: Gait  belt Activity Tolerance: Patient tolerated treatment well Patient left: in chair;with call bell/phone within reach;with family/visitor present;with chair alarm set Nurse Communication: Mobility status PT Visit Diagnosis: Muscle weakness (generalized) (M62.81);Difficulty in walking, not elsewhere classified (R26.2)    Time: [id number] PT Time Calculation (min) (ACUTE ONLY): 21 min   Charges:   PT Evaluation $PT Eval Low Complexity: 1 Low         Gwynneth Albright PT, DPT Physical Therapist with [facility]  11/27/2019 5:18 PM

## 2019-11-27 NOTE — Transfer of Care (Signed)
Immediate Anesthesia Transfer of Care Note  Patient: Kevin Murillo  Procedure(s) Performed: Procedure(s): TOTAL HIP ARTHROPLASTY ANTERIOR APPROACH (Right)  Patient Location: PACU  Anesthesia Type:Spinal  Level of Consciousness: awake, alert  and oriented  Airway & Oxygen Therapy: Patient Spontanous Breathing  Post-op Assessment: Report given to RN and Post -op Vital signs reviewed and stable  Post vital signs: Reviewed and stable  Last Vitals:  Vitals:   11/27/19 0858 11/27/19 1400  BP: 112/80   Pulse: 77   Resp: 14 (P) 17  Temp: 37 C (P) 36.6 C  SpO2: [id number]     Complications: No apparent anesthesia complications

## 2019-11-27 NOTE — Anesthesia Postprocedure Evaluation (Signed)
Anesthesia Post Note  Patient: Kevin Murillo  Procedure(s) Performed: TOTAL HIP ARTHROPLASTY ANTERIOR APPROACH (Right Hip)     Patient location during evaluation: PACU Anesthesia Type: Spinal and General Level of consciousness: awake Pain management: pain level controlled Vital Signs Assessment: post-procedure vital signs reviewed and stable Respiratory status: spontaneous breathing Cardiovascular status: stable Postop Assessment: no apparent nausea or vomiting Anesthetic complications: no    Last Vitals:  Vitals:   11/27/19 1851 11/27/19 2132  BP: (!) 98/57 108/69  Pulse: 81 72  Resp: 17 16  Temp: (!) 36.4 C 36.6 C  SpO2: 97% 96%    Last Pain:  Vitals:   11/27/19 2208  TempSrc:   PainSc: 6                  Belal Scallon

## 2019-11-27 NOTE — Anesthesia Procedure Notes (Signed)
Spinal  Patient location during procedure: OR Start time: 11/27/2019 11:24 AM Staffing Anesthesiologist: Barnet Glasgow, MD Preanesthetic Checklist Completed: patient identified, IV checked, site marked, risks and benefits discussed, surgical consent, monitors and equipment checked, pre-op evaluation and timeout performed Spinal Block Patient position: sitting Prep: Betadine Patient monitoring: heart rate, continuous pulse ox, blood pressure and cardiac monitor Approach: midline Location: L3-4 Injection technique: single-shot Needle Needle type: Introducer and Pencan  Needle gauge: 24 G Needle length: 9 cm Additional Notes Negative paresthesia. Negative blood return. Positive free-flowing CSF. Expiration date of kit checked and confirmed. Patient tolerated procedure well, without complications. Hubbed the needle and Fully tented needle to get CSF

## 2019-11-27 NOTE — Discharge Instructions (Signed)
°Dr. Mccartney Chuba °Joint Replacement Specialist °[facility] °[address] °, [place] [address] °[phone] ° ° °TOTAL HIP REPLACEMENT POSTOPERATIVE DIRECTIONS ° ° ° °Hip Rehabilitation, Guidelines Following Surgery  ° °WEIGHT BEARING °Weight bearing as tolerated with assist device (walker, cane, etc) as directed, use it as long as suggested by your surgeon or therapist, typically at least 4-6 weeks. ° °The results of a hip operation are greatly improved after range of motion and muscle strengthening exercises. Follow all safety measures which are given to protect your hip. If any of these exercises cause increased pain or swelling in your joint, decrease the amount until you are comfortable again. Then slowly increase the exercises. Call your caregiver if you have problems or questions.  ° °HOME CARE INSTRUCTIONS  °Most of the following instructions are designed to prevent the dislocation of your new hip.  °Remove items at home which could result in a fall. This includes throw rugs or furniture in walking pathways.  °Continue medications as instructed at time of discharge. °· You may have some home medications which will be placed on hold until you complete the course of blood thinner medication. °· You may start showering once you are discharged home. Do not remove your dressing. °Do not put on socks or shoes without following the instructions of your caregivers.   °Sit on chairs with arms. Use the chair arms to help push yourself up when arising.  °Arrange for the use of a toilet seat elevator so you are not sitting low.  °· Walk with walker as instructed.  °You may resume a sexual relationship in one month or when given the OK by your caregiver.  °Use walker as long as suggested by your caregivers.  °You may put full weight on your legs and walk as much as is comfortable. °Avoid periods of inactivity such as sitting longer than an hour when not asleep. This helps prevent  blood clots.  °You may return to work once you are cleared by your surgeon.  °Do not drive a car for 6 weeks or until released by your surgeon.  °Do not drive while taking narcotics.  °Wear elastic stockings for two weeks following surgery during the day but you may remove then at night.  °Make sure you keep all of your appointments after your operation with all of your doctors and caregivers. You should call the office at the above phone number and make an appointment for approximately two weeks after the date of your surgery. °Please pick up a stool softener and laxative for home use as long as you are requiring pain medications. °· ICE to the affected hip every three hours for 30 minutes at a time and then as needed for pain and swelling. Continue to use ice on the hip for pain and swelling from surgery. You may notice swelling that will progress down to the foot and ankle.  This is normal after surgery.  Elevate the leg when you are not up walking on it.   °It is important for you to complete the blood thinner medication as prescribed by your doctor. °· Continue to use the breathing machine which will help keep your temperature down.  It is common for your temperature to cycle up and down following surgery, especially at night when you are not up moving around and exerting yourself.  The breathing machine keeps your lungs expanded and your temperature down. ° °RANGE OF MOTION AND STRENGTHENING EXERCISES  °These exercises are   designed to help you keep full movement of your hip joint. Follow your caregiver's or physical therapist's instructions. Perform all exercises about fifteen times, three times per day or as directed. Exercise both hips, even if you have had only one joint replacement. These exercises can be done on a training (exercise) mat, on the floor, on a table or on a bed. Use whatever works the best and is most comfortable for you. Use music or television while you are exercising so that the exercises  are a pleasant break in your day. This will make your life better with the exercises acting as a break in routine you can look forward to.  °Lying on your back, slowly slide your foot toward your buttocks, raising your knee up off the floor. Then slowly slide your foot back down until your leg is straight again.  °Lying on your back spread your legs as far apart as you can without causing discomfort.  °Lying on your side, raise your upper leg and foot straight up from the floor as far as is comfortable. Slowly lower the leg and repeat.  °Lying on your back, tighten up the muscle in the front of your thigh (quadriceps muscles). You can do this by keeping your leg straight and trying to raise your heel off the floor. This helps strengthen the largest muscle supporting your knee.  °Lying on your back, tighten up the muscles of your buttocks both with the legs straight and with the knee bent at a comfortable angle while keeping your heel on the floor.  ° °SKILLED REHAB INSTRUCTIONS: °If the patient is transferred to a skilled rehab facility following release from the hospital, a list of the current medications will be sent to the facility for the patient to continue.  When discharged from the skilled rehab facility, please have the facility set up the patient's Home Health Physical Therapy prior to being released. Also, the skilled facility will be responsible for providing the patient with their medications at time of release from the facility to include their pain medication and their blood thinner medication. If the patient is still at the rehab facility at time of the two week follow up appointment, the skilled rehab facility will also need to assist the patient in arranging follow up appointment in our office and any transportation needs. ° °MAKE SURE YOU:  °Understand these instructions.  °Will watch your condition.  °Will get help right away if you are not doing well or get worse. ° °Pick up stool softner and  laxative for home use following surgery while on pain medications. °Do not remove your dressing. °The dressing is waterproof--it is OK to take showers. °Continue to use ice for pain and swelling after surgery. °Do not use any lotions or creams on the incision until instructed by your surgeon. °Total Hip Protocol. ° ° °

## 2019-11-28 ENCOUNTER — Encounter: Payer: Self-pay | Admitting: *Deleted

## 2019-11-28 DIAGNOSIS — M1611 Unilateral primary osteoarthritis, right hip: Secondary | ICD-10-CM | POA: Diagnosis not present

## 2019-11-28 LAB — CBC
HCT: 36.4 % — ABNORMAL LOW (ref 39.0–52.0)
Hemoglobin: 11.3 g/dL — ABNORMAL LOW (ref 13.0–17.0)
MCH: 30.1 pg (ref 26.0–34.0)
MCHC: 31 g/dL (ref 30.0–36.0)
MCV: 96.8 fL (ref 80.0–100.0)
Platelets: 164 10*3/uL (ref 150–400)
RBC: 3.76 MIL/uL — ABNORMAL LOW (ref 4.22–5.81)
RDW: 13.5 % (ref 11.5–15.5)
WBC: 9.4 10*3/uL (ref 4.0–10.5)
nRBC: 0 % (ref 0.0–0.2)

## 2019-11-28 LAB — BASIC METABOLIC PANEL
Anion gap: 10 (ref 5–15)
BUN: 20 mg/dL (ref 8–23)
CO2: 22 mmol/L (ref 22–32)
Calcium: 8.3 mg/dL — ABNORMAL LOW (ref 8.9–10.3)
Chloride: 105 mmol/L (ref 98–111)
Creatinine, Ser: 1 mg/dL (ref 0.61–1.24)
GFR calc Af Amer: >60 mL/min (ref >60)
GFR calc non Af Amer: >60 mL/min (ref >60)
Glucose, Bld: 135 mg/dL — ABNORMAL HIGH (ref 70–99)
Potassium: 4.2 mmol/L (ref 3.5–5.1)
Sodium: 137 mmol/L (ref 135–145)

## 2019-11-28 MED ORDER — DOCUSATE SODIUM 100 MG PO CAPS: 100.0000 mg | ORAL_CAPSULE | Freq: Two times a day (BID) | ORAL | 1 refills | Status: DC

## 2019-11-28 MED ORDER — SENNA 8.6 MG PO TABS: 2.0000 | ORAL_TABLET | Freq: Every day | ORAL | 1 refills | Status: DC

## 2019-11-28 MED ORDER — ONDANSETRON HCL 4 MG PO TABS: 4.0000 mg | ORAL_TABLET | Freq: Four times a day (QID) | ORAL | 0 refills | Status: DC | PRN

## 2019-11-28 MED ORDER — ASPIRIN 81 MG PO CHEW: 81.0000 mg | CHEWABLE_TABLET | Freq: Two times a day (BID) | ORAL | 1 refills | Status: AC

## 2019-11-28 MED ORDER — HYDROCODONE-ACETAMINOPHEN 5-325 MG PO TABS: 1.0000 | ORAL_TABLET | Freq: Four times a day (QID) | ORAL | 0 refills | Status: DC | PRN

## 2019-11-28 MED ORDER — CELECOXIB 200 MG PO CAPS: 200.0000 mg | ORAL_CAPSULE | Freq: Every day | ORAL | 3 refills | Status: DC

## 2019-11-28 NOTE — Discharge Summary (Signed)
Physician Discharge Summary  Patient ID: DAISON STEGEN MRN: [id number] DOB/AGE: 1946/03/04 73 y.o.  Admit date: 11/27/2019 Discharge date: 11/28/2019  Admission Diagnoses:  Osteoarthritis of right hip  Discharge Diagnoses:  Principal Problem:   Osteoarthritis of right hip Active Problems:   Primary osteoarthritis of right hip   Past Medical History:  Diagnosis Date  . Chronic systolic CHF (congestive heart failure), NYHA class 1 ([place])   . History of echocardiogram    Echo 3/16: Mild LVH, EF 40-45%, anteroseptal akinesis, grade 1 diastolic dysfunction, moderate LAE // Echo 3/19: diff HK worse in basal and mid inf wall, mild LVH, EF 25-30, mild MR, mild LAE  . Hyperlipidemia   . Hypertension   . Neuropathy of hand   . Non-ischemic cardiomyopathy ([place])    a. Echo 6/11: 30-35% // b. LHC 7/11: EF 30-35% with diffuse hypokinesis, no angiographic CAD. SPEP and Fe studies unremarkable, TSH normal.//  c. Echo 1/12: EF 45% // d. Echo 6/14: EF 20-25%  //  e. Echo EF 25-30%, diffuse hypokinesis, prominent apical trabeculation // f. CPX 3/15: normal fxnal capacity  // g. CPX 3/16 low norma fxnal capacity  // h. Echo 3/16: mild LVH, EF 40-45%   . Obesity   . PVC (premature ventricular contraction)   . Rosacea   . Tubular adenoma of colon 12/2010    Surgeries: Procedure(s): TOTAL HIP ARTHROPLASTY ANTERIOR APPROACH on 11/27/2019   Consultants (if any):   Discharged Condition: Improved  Hospital Course: MADSEN FROHN is an 73 y.o. male who was admitted 11/27/2019 with a diagnosis of Osteoarthritis of right hip and went to the operating room on 11/27/2019 and underwent the above named procedures.    He was given perioperative antibiotics:  Anti-infectives (From admission, onward)   Start     Dose/Rate Route Frequency Ordered Stop   11/27/19 1800  ceFAZolin (ANCEF) IVPB 2g/100 mL premix     2 g 200 mL/hr over 30 Minutes Intravenous Every 6 hours 11/27/19 1557 11/28/19 0024    11/27/19 0830  ceFAZolin (ANCEF) IVPB 2g/100 mL premix     2 g 200 mL/hr over 30 Minutes Intravenous On call to O.R. 11/27/19 0827 11/27/19 1127    .  He was given sequential compression devices, early ambulation, and ASA for DVT prophylaxis.  He benefited maximally from the hospital stay and there were no complications.    Recent vital signs:  Vitals:   11/28/19 0528 11/28/19 0940  BP: (!) 105/59 117/65  Pulse: 78 90  Resp: 18 18  Temp: 98.4 F (36.9 C) 99.1 F (37.3 C)  SpO2: 95% 97%    Recent laboratory studies:  Lab Results  Component Value Date   HGB 11.3 (L) 11/28/2019   HGB 13.4 11/20/2019   HGB 12.9 (L) 07/26/2019   Lab Results  Component Value Date   WBC 9.4 11/28/2019   PLT 164 11/28/2019   Lab Results  Component Value Date   INR 1.0 11/20/2019   Lab Results  Component Value Date   NA 137 11/28/2019   K 4.2 11/28/2019   CL 105 11/28/2019   CO2 22 11/28/2019   BUN 20 11/28/2019   CREATININE 1.00 11/28/2019   GLUCOSE 135 (H) 11/28/2019    Discharge Medications:   Allergies as of 11/28/2019   No Known Allergies     Medication List    STOP taking these medications   aspirin 81 MG tablet Replaced by: aspirin 81 MG chewable tablet  TAKE these medications   aspirin 81 MG chewable tablet Chew 1 tablet (81 mg total) by mouth 2 (two) times daily. Replaces: aspirin 81 MG tablet   BiDil 20-37.5 MG tablet Generic drug: isosorbide-hydrALAZINE Take 1 tablet by mouth 3 (three) times daily.   carvedilol 25 MG tablet Commonly known as: COREG TAKE 1 TABLET BY MOUTH TWICE A DAY What changed: when to take this   celecoxib 200 MG capsule Commonly known as: CELEBREX Take 1 capsule (200 mg total) by mouth daily.   docusate sodium 100 MG capsule Commonly known as: COLACE Take 1 capsule (100 mg total) by mouth 2 (two) times daily.   Entresto 97-103 MG Generic drug: sacubitril-valsartan TAKE 1 TABLET BY MOUTH TWICE A DAY    HYDROcodone-acetaminophen 5-325 MG tablet Commonly known as: NORCO/VICODIN Take 1 tablet by mouth every 6 (six) hours as needed for moderate pain (pain score 4-6).   multivitamin with minerals Tabs tablet Take 1 tablet by mouth daily.   ondansetron 4 MG tablet Commonly known as: ZOFRAN Take 1 tablet (4 mg total) by mouth every 6 (six) hours as needed for nausea.   senna 8.6 MG Tabs tablet Commonly known as: SENOKOT Take 2 tablets (17.2 mg total) by mouth at bedtime.   simvastatin 40 MG tablet Commonly known as: ZOCOR Take 40 mg by mouth daily.   spironolactone 25 MG tablet Commonly known as: ALDACTONE Take 1 tablet (25 mg total) by mouth daily.       Diagnostic Studies: DG Pelvis Portable  Result Date: 11/27/2019 CLINICAL DATA:  73 year old male status post right hip arthroplasty. EXAM: PORTABLE PELVIS 1-2 VIEWS COMPARISON:  Earlier intraoperative fluoroscopic image dated 11/27/2019. FINDINGS: Total right hip arthroplasty. The arthroplasty components appear intact and in anatomic alignment. There is no acute fracture or dislocation. 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 The soft tissues are unremarkable. IMPRESSION: Right hip arthroplasty appears intact.  No immediate complication. Electronically Signed   By: Anner Crete M.D.   On: 11/27/2019 14:44   DG C-Arm 1-60 Min-No Report  Result Date: 11/27/2019 Fluoroscopy was utilized by the requesting physician.  No radiographic interpretation.   DG HIP OPERATIVE UNILAT W OR W/O PELVIS RIGHT  Result Date: 11/27/2019 CLINICAL DATA:  Right hip arthroplasty. EXAM: OPERATIVE right HIP (WITH PELVIS IF PERFORMED) 3 VIEWS TECHNIQUE: Fluoroscopic spot image(s) were submitted for interpretation post-operatively. Radiation exposure index: 5.4865 mGy. COMPARISON:  None. FINDINGS: Three intraoperative fluoroscopic images demonstrate the right acetabular and femoral components to be well situated. IMPRESSION: Status post right total hip  arthroplasty. Electronically Signed   By: Marijo Conception M.D.   On: 11/27/2019 13:20    Disposition: Discharge disposition: 01-Home or Self Care       Discharge Instructions    Call MD / Call 911   Complete by: As directed    If you experience chest pain or shortness of breath, CALL 911 and be transported to the hospital emergency room.  If you develope a fever above 101 F, pus (white drainage) or increased drainage or redness at the wound, or calf pain, call your surgeon's office.   Constipation Prevention   Complete by: As directed    Drink plenty of fluids.  Prune juice may be helpful.  You may use a stool softener, such as Colace (over the counter) 100 mg twice a day.  Use MiraLax (over the counter) for constipation as needed.   Diet - low sodium heart healthy   Complete by: As directed  Driving restrictions   Complete by: As directed    No driving for 6 weeks   Increase activity slowly as tolerated   Complete by: As directed    Lifting restrictions   Complete by: As directed    No lifting for 6 weeks   TED hose   Complete by: As directed    Use stockings (TED hose) for 2 weeks on both leg(s).  You may remove them at night for sleeping.      Follow-up Information    Pariss Hommes, Aaron Edelman, MD. Schedule an appointment as soon as possible for a visit in 2 weeks.   Specialty: Orthopedic Surgery Why: For wound re-check Contact information: [address] [place] [address] [phone]            Signed: Hilton Cork Adryan Druckenmiller 11/28/2019, 12:50 PM

## 2019-11-28 NOTE — Progress Notes (Signed)
Physical Therapy Treatment Patient Details Name: Kevin Murillo MRN: [id number] DOB: August 13, 1946 Today's Date: 11/28/2019    History of Present Illness Patient is 73 y.o. male s/p Rt THA anterior approach on 11/27/19 with PMH of HTN, HLD, CHF.    PT Comments    POD # 1 pm session Son present during session.  Assisted with amb a greater distance in hallway then practiced multiple steps. General stair comments: with son present for "hands on" instruction using one rail and a cane and 25% VC's on proper tech and sequencing.  Reviewed HEP.  Instructed on ICE.  Addressed all mobility questions, discussed appropriate activity, educated on use of ICE.  Pt ready for D/C to home.    Follow Up Recommendations  Follow surgeon's recommendation for DC plan and follow-up therapies     Equipment Recommendations  None recommended by PT    Recommendations for Other Services       Precautions / Restrictions Precautions Precautions: Fall Restrictions Weight Bearing Restrictions: No Other Position/Activity Restrictions: WBAT    Mobility  Bed Mobility Overal bed mobility: Needs Assistance Bed Mobility: Supine to Sit     Supine to sit: Supervision;Min guard     General bed mobility comments: Pt OOB in recliner  Transfers Overall transfer level: Needs assistance Equipment used: Rolling walker (2 wheeled) Transfers: Sit to/from [facility] Sit to Stand: Supervision Stand pivot transfers: Supervision;Min guard       General transfer comment: 25% VC's on proper hand placement and safety with turns..  Also assisted to bathroom to void (200cc).  25% VC's on safety with stand to sit.  Ambulation/Gait Ambulation/Gait assistance: Supervision;Min guard Gait Distance (Feet): 115 Feet Assistive device: Rolling walker (2 wheeled) Gait Pattern/deviations: Step-through pattern;Decreased stance time - right Gait velocity: decreased   General Gait Details: 25% VC's on  upright posture, safety withy runs and proper walker to self distance.   Stairs Stairs: Yes Stairs assistance: Supervision;Min guard Stair Management: One rail Left;Forwards;With cane;Step to pattern Number of Stairs: 9 General stair comments: with son present for "hands on" instruction using one rail and a cane and 25% VC's on proper tech and sequencing   Wheelchair Mobility    Modified Rankin (Stroke Patients Only)       Balance                                            Cognition Arousal/Alertness: Awake/alert Behavior During Therapy: WFL for tasks assessed/performed Overall Cognitive Status: Within Functional Limits for tasks assessed                                        Exercises      General Comments        Pertinent Vitals/Pain Pain Assessment: 0-10 Pain Score: 3  Pain Location: R hip Pain Descriptors / Indicators: Aching;Discomfort Pain Intervention(s): Monitored during session;Premedicated before session;Repositioned    Home Living                      Prior Function            PT Goals (current goals can now be found in the care plan section) Progress towards PT goals: Progressing toward goals    Frequency    7X/week  PT Plan Current plan remains appropriate    Co-evaluation              AM-PAC PT "6 Clicks" Mobility   Outcome Measure  Help needed turning from your back to your side while in a flat bed without using bedrails?: A Little Help needed moving from lying on your back to sitting on the side of a flat bed without using bedrails?: A Little Help needed moving to and from a bed to a chair (including a wheelchair)?: A Little Help needed standing up from a chair using your arms (e.g., wheelchair or bedside chair)?: A Little Help needed to walk in hospital room?: A Little Help needed climbing 3-5 steps with a railing? : A Little 6 Click Score: 18    End of Session Equipment  Utilized During Treatment: Gait belt Activity Tolerance: Patient tolerated treatment well Patient left: in chair;with call bell/phone within reach;with family/visitor present;with chair alarm set   PT Visit Diagnosis: Muscle weakness (generalized) (M62.81);Difficulty in walking, not elsewhere classified (R26.2)     Time: [id number] PT Time Calculation (min) (ACUTE ONLY): 34 min  Charges:  $Gait Training: 8-22 mins $Therapeutic Activity: 8-22 mins                     Rica Koyanagi  PTA Acute  Rehabilitation Services Pager      [phone] Office      [phone]

## 2019-11-28 NOTE — Plan of Care (Signed)
Pt ready for DC home 

## 2019-11-28 NOTE — Progress Notes (Signed)
    Subjective:  Patient reports pain as mild to moderate.  Denies N/V/CP/SOB. No c/o.  Objective:   VITALS:   Vitals:   11/27/19 2132 11/28/19 0113 11/28/19 0528 11/28/19 0940  BP: 108/69 (!) 99/58 (!) 105/59 117/65  Pulse: 72 88 78 90  Resp: 16 18 18 18   Temp: 97.8 F (36.6 C) 98 F (36.7 C) 98.4 F (36.9 C) 99.1 F (37.3 C)  TempSrc: Oral Oral Oral Oral  SpO2: 96% 96% 95% 97%  Weight:      Height:        NAD ABD soft Sensation intact distally Intact pulses distally Dorsiflexion/Plantar flexion intact Incision: dressing C/D/I Compartment soft   Lab Results  Component Value Date   WBC 9.4 11/28/2019   HGB 11.3 (L) 11/28/2019   HCT 36.4 (L) 11/28/2019   MCV 96.8 11/28/2019   PLT 164 11/28/2019   BMET    Component Value Date/Time   NA 137 11/28/2019 0454   NA 140 03/05/2018 1205   K 4.2 11/28/2019 0454   CL 105 11/28/2019 0454   CO2 22 11/28/2019 0454   GLUCOSE 135 (H) 11/28/2019 0454   BUN 20 11/28/2019 0454   BUN 18 03/05/2018 1205   CREATININE 1.00 11/28/2019 0454   CALCIUM 8.3 (L) 11/28/2019 0454   GFRNONAA >60 11/28/2019 0454   GFRAA >60 11/28/2019 0454     Assessment/Plan: 1 Day Post-Op   Principal Problem:   Osteoarthritis of right hip Active Problems:   Primary osteoarthritis of right hip   WBAT with walker DVT ppx: Aspirin, SCDs, TEDS PO pain control PT/OT Dispo: D/C home with HEP   Kevin Murillo Kevin Murillo 11/28/2019, 12:45 PM   Rod Can, MD [phone] [place] is now [facility]  [place] [address], [address], [place], [place] [address] Phone: [phone] [email or web address] Facebook  [employer]

## 2019-11-28 NOTE — Progress Notes (Signed)
Physical Therapy Treatment Patient Details Name: Kevin Murillo MRN: [id number] DOB: [birth year] Today's Date: 11/28/2019    History of Present Illness Patient is 73 y.o. male s/p Rt THA anterior approach on 11/27/19 with PMH of HTN, HLD, CHF.    PT Comments    POD # 1 am session Assisted OOB.  General bed mobility comments: demonstarted and instructed how to use a belt loop to self assist LE off/onto bed.  General transfer comment: 25% VC's on proper hand placement and safety with turns..  Also assisted to bathroom to void (200cc).  25% VC's on safety with stand to sit. General Gait Details: 25% VC's on upright posture, safety withy runs and proper walker to self distance.Then returned to room to perform some TE's following HEP handout.  Instructed on proper tech, freq as well as use of ICE.     Follow Up Recommendations  Follow surgeon's recommendation for DC plan and follow-up therapies     Equipment Recommendations  None recommended by PT    Recommendations for Other Services       Precautions / Restrictions Precautions Precautions: Fall Restrictions Weight Bearing Restrictions: No Other Position/Activity Restrictions: WBAT    Mobility  Bed Mobility Overal bed mobility: Needs Assistance Bed Mobility: Supine to Sit     Supine to sit: Supervision;Min guard     General bed mobility comments: demonstarted and instructed how to use a belt loop to self assist LE off/onto bed  Transfers Overall transfer level: Needs assistance Equipment used: Rolling walker (2 wheeled) Transfers: Sit to/from [facility] Sit to Stand: Supervision Stand pivot transfers: Supervision;Min guard       General transfer comment: 25% VC's on proper hand placement and safety with turns..  Also assisted to bathroom to void (200cc).  25% VC's on safety with stand to sit.  Ambulation/Gait Ambulation/Gait assistance: Supervision;Min guard Gait Distance (Feet): 75  Feet Assistive device: Rolling walker (2 wheeled) Gait Pattern/deviations: Step-through pattern;Decreased stance time - right Gait velocity: decreased   General Gait Details: 25% VC's on upright posture, safety withy runs and proper walker to self distance.   Stairs             Wheelchair Mobility    Modified Rankin (Stroke Patients Only)       Balance                                            Cognition Arousal/Alertness: Awake/alert Behavior During Therapy: WFL for tasks assessed/performed Overall Cognitive Status: Within Functional Limits for tasks assessed                                        Exercises      General Comments        Pertinent Vitals/Pain Pain Assessment: 0-10 Pain Score: 3  Pain Location: R hip Pain Descriptors / Indicators: Aching;Discomfort Pain Intervention(s): Monitored during session;Premedicated before session;Repositioned    Home Living                      Prior Function            PT Goals (current goals can now be found in the care plan section) Progress towards PT goals: Progressing toward goals    Frequency  7X/week      PT Plan Current plan remains appropriate    Co-evaluation              AM-PAC PT "6 Clicks" Mobility   Outcome Measure  Help needed turning from your back to your side while in a flat bed without using bedrails?: A Little Help needed moving from lying on your back to sitting on the side of a flat bed without using bedrails?: A Little Help needed moving to and from a bed to a chair (including a wheelchair)?: A Little Help needed standing up from a chair using your arms (e.g., wheelchair or bedside chair)?: A Little Help needed to walk in hospital room?: A Little Help needed climbing 3-5 steps with a railing? : A Little 6 Click Score: 18    End of Session Equipment Utilized During Treatment: Gait belt Activity Tolerance: Patient tolerated  treatment well Patient left: in chair;with call bell/phone within reach;with family/visitor present;with chair alarm set   PT Visit Diagnosis: Muscle weakness (generalized) (M62.81);Difficulty in walking, not elsewhere classified (R26.2)     Time: [id number] PT Time Calculation (min) (ACUTE ONLY): 26 min  Charges:  $Gait Training: 8-22 mins $Therapeutic Exercise: 8-22 mins                     Rica Koyanagi  PTA Acute  Rehabilitation Services Pager      [phone] Office      [phone]

## 2019-12-03 DIAGNOSIS — N200 Calculus of kidney: Secondary | ICD-10-CM | POA: Diagnosis not present

## 2019-12-03 DIAGNOSIS — R972 Elevated prostate specific antigen [PSA]: Secondary | ICD-10-CM | POA: Diagnosis not present

## 2019-12-03 DIAGNOSIS — L723 Sebaceous cyst: Secondary | ICD-10-CM | POA: Diagnosis not present

## 2019-12-03 DIAGNOSIS — I5022 Chronic systolic (congestive) heart failure: Secondary | ICD-10-CM | POA: Diagnosis not present

## 2019-12-03 DIAGNOSIS — I77819 Aortic ectasia, unspecified site: Secondary | ICD-10-CM | POA: Diagnosis not present

## 2019-12-03 DIAGNOSIS — I110 Hypertensive heart disease with heart failure: Secondary | ICD-10-CM | POA: Diagnosis not present

## 2019-12-03 DIAGNOSIS — D649 Anemia, unspecified: Secondary | ICD-10-CM | POA: Diagnosis not present

## 2019-12-03 DIAGNOSIS — R739 Hyperglycemia, unspecified: Secondary | ICD-10-CM | POA: Diagnosis not present

## 2019-12-03 DIAGNOSIS — I2721 Secondary pulmonary arterial hypertension: Secondary | ICD-10-CM | POA: Diagnosis not present

## 2019-12-03 DIAGNOSIS — Z96641 Presence of right artificial hip joint: Secondary | ICD-10-CM | POA: Diagnosis not present

## 2019-12-10 DIAGNOSIS — Z96641 Presence of right artificial hip joint: Secondary | ICD-10-CM | POA: Diagnosis not present

## 2019-12-10 DIAGNOSIS — Z471 Aftercare following joint replacement surgery: Secondary | ICD-10-CM | POA: Diagnosis not present

## 2019-12-21 ENCOUNTER — Other Ambulatory Visit (HOSPITAL_COMMUNITY): Payer: Self-pay | Admitting: Cardiology

## 2020-01-07 DIAGNOSIS — E7849 Other hyperlipidemia: Secondary | ICD-10-CM | POA: Diagnosis not present

## 2020-01-07 DIAGNOSIS — R739 Hyperglycemia, unspecified: Secondary | ICD-10-CM | POA: Diagnosis not present

## 2020-01-07 DIAGNOSIS — Z96641 Presence of right artificial hip joint: Secondary | ICD-10-CM | POA: Diagnosis not present

## 2020-01-07 DIAGNOSIS — Z471 Aftercare following joint replacement surgery: Secondary | ICD-10-CM | POA: Diagnosis not present

## 2020-01-25 ENCOUNTER — Encounter (HOSPITAL_COMMUNITY): Payer: Self-pay

## 2020-01-25 ENCOUNTER — Other Ambulatory Visit: Payer: Self-pay

## 2020-01-25 ENCOUNTER — Emergency Department (HOSPITAL_BASED_OUTPATIENT_CLINIC_OR_DEPARTMENT_OTHER): Payer: PPO

## 2020-01-25 ENCOUNTER — Emergency Department (HOSPITAL_COMMUNITY)
Admission: EM | Admit: 2020-01-25 | Discharge: 2020-01-25 | Disposition: A | Discharge status: Home / Self Care | Payer: PPO | Attending: Emergency Medicine | Admitting: Emergency Medicine

## 2020-01-25 DIAGNOSIS — N390 Urinary tract infection, site not specified: Secondary | ICD-10-CM | POA: Diagnosis not present

## 2020-01-25 DIAGNOSIS — R2241 Localized swelling, mass and lump, right lower limb: Secondary | ICD-10-CM | POA: Diagnosis not present

## 2020-01-25 DIAGNOSIS — R0902 Hypoxemia: Secondary | ICD-10-CM | POA: Diagnosis not present

## 2020-01-25 DIAGNOSIS — I110 Hypertensive heart disease with heart failure: Secondary | ICD-10-CM | POA: Insufficient documentation

## 2020-01-25 DIAGNOSIS — R4020 Unspecified coma: Secondary | ICD-10-CM | POA: Diagnosis not present

## 2020-01-25 DIAGNOSIS — Z79899 Other long term (current) drug therapy: Secondary | ICD-10-CM | POA: Insufficient documentation

## 2020-01-25 DIAGNOSIS — R55 Syncope and collapse: Secondary | ICD-10-CM | POA: Insufficient documentation

## 2020-01-25 DIAGNOSIS — I5022 Chronic systolic (congestive) heart failure: Secondary | ICD-10-CM | POA: Insufficient documentation

## 2020-01-25 DIAGNOSIS — I959 Hypotension, unspecified: Secondary | ICD-10-CM | POA: Diagnosis not present

## 2020-01-25 DIAGNOSIS — Z87891 Personal history of nicotine dependence: Secondary | ICD-10-CM | POA: Diagnosis not present

## 2020-01-25 DIAGNOSIS — I10 Essential (primary) hypertension: Secondary | ICD-10-CM | POA: Diagnosis not present

## 2020-01-25 DIAGNOSIS — R609 Edema, unspecified: Secondary | ICD-10-CM | POA: Diagnosis not present

## 2020-01-25 LAB — CBC WITH DIFFERENTIAL/PLATELET
Abs Immature Granulocytes: 0.02 10*3/uL (ref 0.00–0.07)
Basophils Absolute: 0 10*3/uL (ref 0.0–0.1)
Basophils Relative: 0 %
Eosinophils Absolute: 0 10*3/uL (ref 0.0–0.5)
Eosinophils Relative: 0 %
HCT: 43.2 % (ref 39.0–52.0)
Hemoglobin: 13.4 g/dL (ref 13.0–17.0)
Immature Granulocytes: 0 %
Lymphocytes Relative: 25 %
Lymphs Abs: 1.7 10*3/uL (ref 0.7–4.0)
MCH: 29.7 pg (ref 26.0–34.0)
MCHC: 31 g/dL (ref 30.0–36.0)
MCV: 95.8 fL (ref 80.0–100.0)
Monocytes Absolute: 0.8 10*3/uL (ref 0.1–1.0)
Monocytes Relative: 11 %
Neutro Abs: 4.4 10*3/uL (ref 1.7–7.7)
Neutrophils Relative %: 64 %
Platelets: 344 10*3/uL (ref 150–400)
RBC: 4.51 MIL/uL (ref 4.22–5.81)
RDW: 13.4 % (ref 11.5–15.5)
WBC: 6.9 10*3/uL (ref 4.0–10.5)
nRBC: 0 % (ref 0.0–0.2)

## 2020-01-25 LAB — URINALYSIS, ROUTINE W REFLEX MICROSCOPIC
Bilirubin Urine: NEGATIVE
Glucose, UA: NEGATIVE mg/dL
Ketones, ur: NEGATIVE mg/dL
Nitrite: NEGATIVE
Protein, ur: 100 mg/dL — AB
Specific Gravity, Urine: 1.015 (ref 1.005–1.030)
WBC, UA: >50 WBC/hpf — ABNORMAL HIGH (ref 0–5)
pH: 5 (ref 5.0–8.0)

## 2020-01-25 LAB — COMPREHENSIVE METABOLIC PANEL
ALT: 16 U/L (ref 0–44)
AST: 20 U/L (ref 15–41)
Albumin: 3.4 g/dL — ABNORMAL LOW (ref 3.5–5.0)
Alkaline Phosphatase: 58 U/L (ref 38–126)
Anion gap: 13 (ref 5–15)
BUN: 21 mg/dL (ref 8–23)
CO2: 24 mmol/L (ref 22–32)
Calcium: 9.3 mg/dL (ref 8.9–10.3)
Chloride: 100 mmol/L (ref 98–111)
Creatinine, Ser: 1.63 mg/dL — ABNORMAL HIGH (ref 0.61–1.24)
GFR calc Af Amer: 47 mL/min — ABNORMAL LOW (ref >60)
GFR calc non Af Amer: 41 mL/min — ABNORMAL LOW (ref >60)
Glucose, Bld: 115 mg/dL — ABNORMAL HIGH (ref 70–99)
Potassium: 4.2 mmol/L (ref 3.5–5.1)
Sodium: 137 mmol/L (ref 135–145)
Total Bilirubin: 0.8 mg/dL (ref 0.3–1.2)
Total Protein: 8.3 g/dL — ABNORMAL HIGH (ref 6.5–8.1)

## 2020-01-25 MED ORDER — SODIUM CHLORIDE 0.9 % IV BOLUS
500.0000 mL | Freq: Once | INTRAVENOUS | Status: AC
  Administered 2020-01-25: 500 mL via INTRAVENOUS

## 2020-01-25 MED ORDER — CEPHALEXIN 500 MG PO CAPS: 500.0000 mg | ORAL_CAPSULE | Freq: Two times a day (BID) | ORAL | 0 refills | Status: DC

## 2020-01-25 NOTE — ED Provider Notes (Signed)
[facility] EMERGENCY DEPARTMENT Provider Note   CSN: [id number] Arrival date & time: 01/25/20  1235     History Chief Complaint  Patient presents with  . Loss of Consciousness    Kevin Murillo is a 74 y.o. male.  Patient is a 74 year old male who presents after syncopal event.  He states that he woke up this morning and was feeling okay.  He walked to the bathroom and got dressed and then he started getting diaphoretic.  He sat down on the bed and his family heard a thud.  He was found on the bed unconscious.  He had one episode of vomiting and diarrhea.  No visualized seizure activity was noted.  Patient seems to be back at baseline currently.  He currently denies any complaints.  He said he got diaphoretic prior to the event which was unusual for him but he did not feel dizzy or lightheaded.  He had no associated chest pain or shortness of breath.  He has had no recent illnesses.  He did have a hip replacement about 6 weeks ago on his right side and yesterday noted that his right leg was swollen.  He is try to keep it elevated.  He denies any numbness or weakness to his extremities.  He was noted to be hypotensive by EMS but they were unable to establish IV access.  His blood pressure on arrival was in the [id number] systolic.        Past Medical History:  Diagnosis Date  . Chronic systolic CHF (congestive heart failure), NYHA class 1 ([place])   . History of echocardiogram    Echo 3/16: Mild LVH, EF 40-45%, anteroseptal akinesis, grade 1 diastolic dysfunction, moderate LAE // Echo 3/19: diff HK worse in basal and mid inf wall, mild LVH, EF 25-30, mild MR, mild LAE  . Hyperlipidemia   . Hypertension   . Neuropathy of hand   . Non-ischemic cardiomyopathy ([place])    a. Echo 6/11: 30-35% // b. LHC 7/11: EF 30-35% with diffuse hypokinesis, no angiographic CAD. SPEP and Fe studies unremarkable, TSH normal.//  c. Echo 1/12: EF 45% // d. Echo 6/14: EF 20-25%  //  e. Echo EF  25-30%, diffuse hypokinesis, prominent apical trabeculation // f. CPX 3/15: normal fxnal capacity  // g. CPX 3/16 low norma fxnal capacity  // h. Echo 3/16: mild LVH, EF 40-45%   . Obesity   . PVC (premature ventricular contraction)   . Rosacea   . Tubular adenoma of colon 12/2010    Patient Active Problem List   Diagnosis Date Noted  . Osteoarthritis of right hip 11/27/2019  . Primary osteoarthritis of right hip 11/27/2019  . Dilated cardiomyopathy ([place]) 05/07/2013  . Personal history of colonic adenomas 11/04/2012  . HYPERLIPIDEMIA-MIXED 06/16/2010  . Essential hypertension 05/27/2010  . LEFT HEART FAILURE 05/27/2010    Past Surgical History:  Procedure Laterality Date  . ARM SURGERY  1967   RIGHT   . COLONOSCOPY    . COLONOSCOPY WITH PROPOFOL N/A 08/28/2019   Procedure: COLONOSCOPY WITH PROPOFOL;  Surgeon: Gatha Mayer, MD;  Location: [facility];  Service: Endoscopy;  Laterality: N/A;  . CYST EXCISION Left 07/29/2019   Procedure: EXCISION OF CHRONIC LEFT LOWER BACK CYST;  Surgeon: Coralie Keens, MD;  Location: [facility];  Service: General;  Laterality: Left;  . KNEE ARTHROSCOPY Left   . RIGHT/LEFT HEART CATH AND CORONARY ANGIOGRAPHY N/A 03/09/2018   Procedure: RIGHT/LEFT HEART CATH AND CORONARY ANGIOGRAPHY;  Surgeon: Larey Dresser, MD;  Location: [facility];  Service: Cardiovascular;  Laterality: N/A;  . TOTAL HIP ARTHROPLASTY Right 11/27/2019   Procedure: TOTAL HIP ARTHROPLASTY ANTERIOR APPROACH;  Surgeon: Rod Can, MD;  Location: [facility];  Service: Orthopedics;  Laterality: Right;       Family History  Problem Relation Age of Onset  . CVA Mother   . Heart attack Other   . Gout Son   . Colon cancer Neg Hx     Social History   Tobacco Use  . Smoking status: Former Smoker    Types: Cigarettes    Quit date: 03/12/2002    Years since quitting: 17.8  . Smokeless tobacco: Never Used  Substance Use Topics  . Alcohol use: Yes    Alcohol/week: 1.0  standard drinks    Types: 1 Cans of beer per week    Comment: Socially  . Drug use: No    Home Medications Prior to Admission medications   Medication Sig Start Date End Date Taking? Authorizing Provider  carvedilol (COREG) 25 MG tablet TAKE 1 TABLET BY MOUTH TWICE A DAY Patient taking differently: Take 25 mg by mouth 2 (two) times daily with a meal.  07/15/19  Yes Weaver, Scott T, PA-C  celecoxib (CELEBREX) 200 MG capsule Take 1 capsule (200 mg total) by mouth daily. 11/28/19  Yes Swinteck, Aaron Edelman, MD  ENTRESTO 97-103 MG TAKE 1 TABLET BY MOUTH TWICE A DAY Patient taking differently: 1 tablet 2 (two) times daily.  12/23/19  Yes Larey Dresser, MD  isosorbide-hydrALAZINE (BIDIL) 20-37.5 MG tablet Take 1 tablet by mouth 3 (three) times daily. 09/23/19  Yes Larey Dresser, MD  Multiple Vitamin (MULTIVITAMIN WITH MINERALS) TABS tablet Take 1 tablet by mouth daily.   Yes [provider]  simvastatin (ZOCOR) 40 MG tablet Take 40 mg by mouth daily.  05/16/16  Yes [provider]  spironolactone (ALDACTONE) 25 MG tablet Take 1 tablet (25 mg total) by mouth daily. 09/23/19  Yes Larey Dresser, MD  docusate sodium (COLACE) 100 MG capsule Take 1 capsule (100 mg total) by mouth 2 (two) times daily. Patient not taking: Reported on 01/25/2020 11/28/19   Rod Can, MD  HYDROcodone-acetaminophen (NORCO/VICODIN) 5-325 MG tablet Take 1 tablet by mouth every 6 (six) hours as needed for moderate pain (pain score 4-6). Patient not taking: Reported on 01/25/2020 11/28/19   Rod Can, MD  ondansetron (ZOFRAN) 4 MG tablet Take 1 tablet (4 mg total) by mouth every 6 (six) hours as needed for nausea. Patient not taking: Reported on 01/25/2020 11/28/19   Rod Can, MD  senna (SENOKOT) 8.6 MG TABS tablet Take 2 tablets (17.2 mg total) by mouth at bedtime. Patient not taking: Reported on 01/25/2020 11/28/19   Rod Can, MD    Allergies    Patient has no known  allergies.  Review of Systems   Review of Systems  Constitutional: Positive for diaphoresis. Negative for chills, fatigue and fever.  HENT: Negative for congestion, rhinorrhea and sneezing.   Eyes: Negative.   Respiratory: Negative for cough, chest tightness and shortness of breath.   Cardiovascular: Positive for leg swelling. Negative for chest pain.  Gastrointestinal: Positive for diarrhea and vomiting. Negative for abdominal pain, blood in stool and nausea.  Genitourinary: Negative for difficulty urinating, flank pain, frequency and hematuria.  Musculoskeletal: Negative for arthralgias and back pain.  Skin: Negative for rash.  Neurological: Positive for syncope. Negative for dizziness, speech difficulty, weakness, numbness and  headaches.    Physical Exam Updated Vital Signs BP 109/74   Pulse 65   Temp 97.8 F (36.6 C) (Oral)   Ht 5\' 10"  (1.778 m)   Wt 108.9 kg   SpO2 94%   BMI 34.44 kg/m   Physical Exam Constitutional:      Appearance: He is well-developed.  HENT:     Head: Normocephalic and atraumatic.  Eyes:     Pupils: Pupils are equal, round, and reactive to light.  Cardiovascular:     Rate and Rhythm: Normal rate and regular rhythm.     Heart sounds: Normal heart sounds.  Pulmonary:     Effort: Pulmonary effort is normal. No respiratory distress.     Breath sounds: Normal breath sounds. No wheezing or rales.  Chest:     Chest wall: No tenderness.  Abdominal:     General: Bowel sounds are normal.     Palpations: Abdomen is soft.     Tenderness: There is no abdominal tenderness. There is no guarding or rebound.  Musculoskeletal:        General: Swelling present. Normal range of motion.     Cervical back: Normal range of motion and neck supple.     Comments: Well-healed incision in his right upper leg from the prior hip surgery.  He has swelling of his right lower leg from the knee down as compared to the left.  Pedal pulses are intact.  He has no warmth or  erythema of the leg.  Lymphadenopathy:     Cervical: No cervical adenopathy.  Skin:    General: Skin is warm and dry.     Findings: No rash.  Neurological:     General: No focal deficit present.     Mental Status: He is alert and oriented to person, place, and time.     ED Results / Procedures / Treatments   Labs (all labs ordered are listed, but only abnormal results are displayed) Labs Reviewed  COMPREHENSIVE METABOLIC PANEL - Abnormal; Notable for the following components:      Result Value   Glucose, Bld 115 (*)    Creatinine, Ser 1.63 (*)    Total Protein 8.3 (*)    Albumin 3.4 (*)    GFR calc non Af Amer 41 (*)    GFR calc Af Amer 47 (*)    All other components within normal limits  URINALYSIS, ROUTINE W REFLEX MICROSCOPIC - Abnormal; Notable for the following components:   APPearance CLOUDY (*)    Hgb urine dipstick SMALL (*)    Protein, ur 100 (*)    Leukocytes,Ua LARGE (*)    WBC, UA >50 (*)    Bacteria, UA RARE (*)    All other components within normal limits  URINE CULTURE  CBC WITH DIFFERENTIAL/PLATELET    EKG EKG Interpretation  Date/Time:  Saturday January 25 2020 12:44:15 EST Ventricular Rate:  73 PR Interval:    QRS Duration: 130 QT Interval:  400 QTC Calculation: 441 R Axis:   -63 Text Interpretation: Sinus arrhythmia Multiple premature complexes, vent & supraven Nonspecific IVCD with LAD Nonspecific T abnormalities, diffuse leads since last tracing no significant change Confirmed by Malvin Johns [phone]) on 01/25/2020 1:25:41 PM   Radiology VAS [place] LOWER EXTREMITY VENOUS (DVT) (ONLY [facility] & [facility] 7a-7p)  Result Date: 01/25/2020  Lower Venous DVTStudy Indications: Edema of the ankle.  Comparison Study: Prior study from 08/01/17 is available for comaprison Performing Technologist: Sharion Dove RVS  Examination Guidelines: A  complete evaluation includes B-mode imaging, spectral Doppler, color Doppler, and power Doppler as needed of all accessible portions  of each vessel. Bilateral testing is considered an integral part of a complete examination. Limited examinations for reoccurring indications may be performed as noted. The reflux portion of the exam is performed with the patient in reverse Trendelenburg.  +---------+---------------+---------+-----------+----------+--------------+ RIGHT    CompressibilityPhasicitySpontaneityPropertiesThrombus Aging +---------+---------------+---------+-----------+----------+--------------+ CFV      Full           Yes      Yes                                 +---------+---------------+---------+-----------+----------+--------------+ SFJ      Full                                                        +---------+---------------+---------+-----------+----------+--------------+ FV Prox  Full                                                        +---------+---------------+---------+-----------+----------+--------------+ FV Mid   Full                                                        +---------+---------------+---------+-----------+----------+--------------+ FV DistalFull                                                        +---------+---------------+---------+-----------+----------+--------------+ PFV      Full                                                        +---------+---------------+---------+-----------+----------+--------------+ POP      Full           Yes      Yes                                 +---------+---------------+---------+-----------+----------+--------------+ PTV      Full                                                        +---------+---------------+---------+-----------+----------+--------------+ PERO     Full                                                        +---------+---------------+---------+-----------+----------+--------------+   +----+---------------+---------+-----------+----------+--------------+  LEFTCompressibilityPhasicitySpontaneityPropertiesThrombus Aging +----+---------------+---------+-----------+----------+--------------+ CFV Full           Yes      Yes                                 +----+---------------+---------+-----------+----------+--------------+     Summary: RIGHT: - No evidence of deep vein thrombosis in the lower extremity. No indirect evidence of obstruction proximal to the inguinal ligament. - Findings appear essentially unchanged compared to previous examination.  LEFT: - No evidence of common femoral vein obstruction.  *See table(s) above for measurements and observations. Electronically signed by Servando Snare MD on 01/25/2020 at 3:57:14 PM.    Final     Procedures Procedures (including critical care time)  Medications Ordered in ED Medications  sodium chloride 0.9 % bolus 500 mL (0 mLs Intravenous Stopped 01/25/20 1335)    ED Course  I have reviewed the triage vital signs and the nursing notes.  Pertinent labs & imaging results that were available during my care of the patient were reviewed by me and considered in my medical decision making (see chart for details).    MDM Rules/Calculators/A&P                      Patient presents after syncopal episode.  No witnessed seizure activity.  He has been asymptomatic since arrival to the ED.  He did have some preceding symptoms of diaphoresis.  He was transiently hypotensive on EMS arrival and initially in the ED.  This responded to gentle fluid rehydration.  He has been able to ambulate in the ED without symptoms.  He has had no arrhythmias.  No focal neurologic deficits.  His labs are nonconcerning.  His right leg has some swelling.  This is the leg where he had his prior hip surgery.  A Doppler ultrasound was performed shows no evidence of DVT.  He does not have shortness of breath, hypoxia or other symptoms that would be more concerning for PE.  His creatinine is slightly more elevated than his prior  values and will need outpatient follow-up.  I did discuss this with the patient and advised that he should call his primary care physician on Monday for close follow-up.  Return precautions were given. Final Clinical Impression(s) / ED Diagnoses Final diagnoses:  Syncope, unspecified syncope type    Rx / DC Orders ED Discharge Orders    None       Malvin Johns, MD 01/25/20 1931

## 2020-01-25 NOTE — Discharge Instructions (Signed)
Follow-up with your doctor within the next few days.  Call Monday for an appointment.  Your creatinine (kidney function) was elevated and needs to be rechecked by your primary care doctor.  Return here as needed if you have any worsening symptoms over the weekend.

## 2020-01-25 NOTE — ED Notes (Signed)
Ambulated pt with cane. Pt ambulated to half way down the hall and back with no assistance.

## 2020-01-25 NOTE — ED Triage Notes (Signed)
Pt bib ems, ems reports that the pt fell out of the bed, family heard a thud, pt was found with rapid breathing, diaphoretic, unconscious with an episode of vomiting and diarrhea. Pt became alert and ambulated on scene. Pt aox4.  110/90 90 rr17 95%ra cbg 140

## 2020-01-25 NOTE — ED Notes (Signed)
Culture sent with urine. 

## 2020-01-25 NOTE — Progress Notes (Signed)
VASCULAR LAB PRELIMINARY  PRELIMINARY  PRELIMINARY  PRELIMINARY  Right lower extremity venous duplex completed.    Preliminary report:  Se CV proc for preliminary results.  Gave Dr. Tamera Punt results.   Taunia Frasco, RVT 01/25/2020, 2:16 PM

## 2020-01-27 DIAGNOSIS — R55 Syncope and collapse: Secondary | ICD-10-CM | POA: Diagnosis not present

## 2020-01-27 DIAGNOSIS — I5022 Chronic systolic (congestive) heart failure: Secondary | ICD-10-CM | POA: Diagnosis not present

## 2020-01-27 DIAGNOSIS — I130 Hypertensive heart and chronic kidney disease with heart failure and stage 1 through stage 4 chronic kidney disease, or unspecified chronic kidney disease: Secondary | ICD-10-CM | POA: Diagnosis not present

## 2020-01-27 DIAGNOSIS — N182 Chronic kidney disease, stage 2 (mild): Secondary | ICD-10-CM | POA: Diagnosis not present

## 2020-01-27 DIAGNOSIS — N390 Urinary tract infection, site not specified: Secondary | ICD-10-CM | POA: Diagnosis not present

## 2020-01-28 LAB — URINE CULTURE: Culture: >=100000 — AB

## 2020-01-29 ENCOUNTER — Telehealth: Payer: Self-pay

## 2020-01-29 NOTE — Telephone Encounter (Signed)
No abx needed for UC ED 01/25/20

## 2020-02-20 DIAGNOSIS — R972 Elevated prostate specific antigen [PSA]: Secondary | ICD-10-CM | POA: Diagnosis not present

## 2020-02-24 ENCOUNTER — Other Ambulatory Visit: Payer: Self-pay | Admitting: Physician Assistant

## 2020-02-24 ENCOUNTER — Other Ambulatory Visit (HOSPITAL_COMMUNITY): Payer: Self-pay | Admitting: Cardiology

## 2020-02-24 DIAGNOSIS — I428 Other cardiomyopathies: Secondary | ICD-10-CM

## 2020-02-25 ENCOUNTER — Telehealth (HOSPITAL_COMMUNITY): Payer: Self-pay | Admitting: Cardiology

## 2020-02-25 NOTE — Telephone Encounter (Signed)
Called patient and left message to call [facility].  Per Kevan Rosebush, RN, pt is overdue for f/u with Dr. Aundra Dubin or if pt is agreeable, he can see APP.

## 2020-02-28 NOTE — Telephone Encounter (Signed)
Patient returned my call.  He prefers to see Dr. Aundra Dubin and is aware of appt 04/17/20 @10  am.

## 2020-02-28 NOTE — Telephone Encounter (Signed)
Patient left message on 02/27/20 returning my call to schedule appt with DM.  Called patient and left 2nd VM to call [facility] to schedule f/u appt.

## 2020-03-06 DIAGNOSIS — N200 Calculus of kidney: Secondary | ICD-10-CM | POA: Diagnosis not present

## 2020-03-06 DIAGNOSIS — R972 Elevated prostate specific antigen [PSA]: Secondary | ICD-10-CM | POA: Diagnosis not present

## 2020-03-06 DIAGNOSIS — N281 Cyst of kidney, acquired: Secondary | ICD-10-CM | POA: Diagnosis not present

## 2020-03-16 ENCOUNTER — Other Ambulatory Visit (HOSPITAL_COMMUNITY): Payer: Self-pay

## 2020-03-16 MED ORDER — BIDIL 20-37.5 MG PO TABS: 1.0000 | ORAL_TABLET | Freq: Three times a day (TID) | ORAL | 0 refills | Status: DC

## 2020-04-17 ENCOUNTER — Encounter (HOSPITAL_COMMUNITY): Payer: Self-pay | Admitting: Cardiology

## 2020-04-17 ENCOUNTER — Other Ambulatory Visit: Payer: Self-pay

## 2020-04-17 ENCOUNTER — Ambulatory Visit (HOSPITAL_COMMUNITY)
Admission: RE | Admit: 2020-04-17 | Discharge: 2020-04-17 | Disposition: A | Discharge status: Home / Self Care | Payer: PPO | Source: Ambulatory Visit | Attending: Cardiology | Admitting: Cardiology

## 2020-04-17 VITALS — BP 118/62 | HR 75 | Ht 70.0 in | Wt 251.0 lb

## 2020-04-17 DIAGNOSIS — Z87891 Personal history of nicotine dependence: Secondary | ICD-10-CM | POA: Diagnosis not present

## 2020-04-17 DIAGNOSIS — E785 Hyperlipidemia, unspecified: Secondary | ICD-10-CM | POA: Diagnosis not present

## 2020-04-17 DIAGNOSIS — Z791 Long term (current) use of non-steroidal anti-inflammatories (NSAID): Secondary | ICD-10-CM | POA: Diagnosis not present

## 2020-04-17 DIAGNOSIS — Z79899 Other long term (current) drug therapy: Secondary | ICD-10-CM | POA: Insufficient documentation

## 2020-04-17 DIAGNOSIS — I501 Left ventricular failure: Secondary | ICD-10-CM

## 2020-04-17 DIAGNOSIS — I5022 Chronic systolic (congestive) heart failure: Secondary | ICD-10-CM | POA: Diagnosis not present

## 2020-04-17 DIAGNOSIS — I428 Other cardiomyopathies: Secondary | ICD-10-CM | POA: Insufficient documentation

## 2020-04-17 DIAGNOSIS — Z8249 Family history of ischemic heart disease and other diseases of the circulatory system: Secondary | ICD-10-CM | POA: Diagnosis not present

## 2020-04-17 DIAGNOSIS — I10 Essential (primary) hypertension: Secondary | ICD-10-CM | POA: Diagnosis not present

## 2020-04-17 LAB — BASIC METABOLIC PANEL
Anion gap: 9 (ref 5–15)
BUN: 30 mg/dL — ABNORMAL HIGH (ref 8–23)
CO2: 24 mmol/L (ref 22–32)
Calcium: 9.4 mg/dL (ref 8.9–10.3)
Chloride: 106 mmol/L (ref 98–111)
Creatinine, Ser: 1.2 mg/dL (ref 0.61–1.24)
GFR calc Af Amer: >60 mL/min (ref >60)
GFR calc non Af Amer: 59 mL/min — ABNORMAL LOW (ref >60)
Glucose, Bld: 111 mg/dL — ABNORMAL HIGH (ref 70–99)
Potassium: 4.3 mmol/L (ref 3.5–5.1)
Sodium: 139 mmol/L (ref 135–145)

## 2020-04-17 LAB — LIPID PANEL
Cholesterol: 116 mg/dL (ref 0–200)
HDL: 52 mg/dL (ref >40)
LDL Cholesterol: 47 mg/dL (ref 0–99)
Total CHOL/HDL Ratio: 2.2 RATIO
Triglycerides: 84 mg/dL (ref <150)
VLDL: 17 mg/dL (ref 0–40)

## 2020-04-17 MED ORDER — BIDIL 20-37.5 MG PO TABS: 2.0000 | ORAL_TABLET | Freq: Three times a day (TID) | ORAL | 11 refills | Status: DC

## 2020-04-17 NOTE — Patient Instructions (Signed)
INCREASE Bidil to 2 tabs three times daily  Labs today We will only contact you if something comes back abnormal or we need to make some changes. Otherwise no news is good news!  Your physician recommends that you schedule a follow-up appointment in: 4 months with Dr Aundra Dubin and echo  Your physician has requested that you have an echocardiogram. Echocardiography is a painless test that uses sound waves to create images of your heart. It provides your doctor with information about the size and shape of your heart and how well your heart's chambers and valves are working. This procedure takes approximately one hour. There are no restrictions for this procedure.   Do the following things EVERYDAY: 1) Weigh yourself in the morning before breakfast. Write it down and keep it in a log. 2) Take your medicines as prescribed 3) Eat low salt foods--Limit salt (sodium) to 2000 mg per day.  4) Stay as active as you can everyday 5) Limit all fluids for the day to less than 2 liters  At the [facility], you and your health needs are our priority. As part of our continuing mission to provide you with exceptional heart care, we have created designated Provider Care Teams. These Care Teams include your primary Cardiologist (physician) and Advanced Practice Providers (APPs- Physician Assistants and Nurse Practitioners) who all work together to provide you with the care you need, when you need it.   You may see any of the following providers on your designated Care Team at your next follow up: Marland Kitchen Dr Glori Bickers . Dr Loralie Champagne . Darrick Grinder, NP . Lyda Jester, PA . Audry Riles, PharmD   Please be sure to bring in all your medications bottles to every appointment.

## 2020-04-19 NOTE — Progress Notes (Signed)
Patient ID: Kevin Murillo, male   DOB: 09/15/1946, 74 y.o.   MRN: [id number] PCP: Dr. Virgina Jock Cardiology: Dr. Aundra Dubin  74 y.o.with history of HTN and nonischemic cardiomyopathy (diagnosed in 2011) presents for cardiology followup.  He had an echo in 1/15, showing that EF remains 25-30% with mild LV dilation.  I had him do a cardiopulmonary exercise test in 3/15 that showed normal functional capacity.  Most recent echo in 4/19 showed EF remains 25-30%.  Repeat LHC/RHC in 4/19 showed no significant coronary disease, mildly elevated LV filling pressure, and CI 2.01.   Echo in 2/20 showed EF 30-35%, normal RV.   He return for followup of CHF.  He had right THR since last appointment with me.  His hip is less painful, he is generally walking without a walker.  No exertional dyspnea.  No chest pain.  No lightheadedness.  Feels good overall.   Labs (6/11): K 3.9, TSH normal, creatinine 1.0  Labs (7/11): HDL 60, LDL 122, K 4.4, creatinine 1.0, BNP 86, SPEP normal, transferrin saturation 24%  Labs (9/11): LDL 67, HDL 48, TGs 145, LFTs normal  Labs (10/11): K 4, creatinine 1.2  Labs (5/13): K 3.9, creatinine 1.0, LDL 66, HDL 44 Labs (7/14): K 3.9, creatinine 1.1 Labs (3/15): K 4.1, creatinine 1.1 Labs (8/15): K 4, creatinine 1.1, BNP 21 Labs (4/19): K 4.4, creatinine 1.06 Labs (5/19): K 4.4, creatinine 1.13 Labs (8/19): K 4.2, creatinine 1.29 Labs (11/19): K 4.1, creatinine 1.09 Labs (2/21): K 4.2, creatinine 1.63  Allergies (verified):  No Known Drug Allergies   Past Medical History:  1. HTN  2. Left hand neuropathy  3. Nonischemic cardiomyopathy: Echo (6/11) with EF 30-35%, posteroinferior severe hypokinesis, moderate diastolic dysfunction, mild left atrial enlargement, mild LV hypertrophy. LHC (7/11): EF 30-35% with diffuse hypokinesis, no angiographic CAD. SPEP and Fe studies unremarkable, TSH normal.  Repeat echo (1/12) with EF 45%, mild global hypokinesis, mild LVH, normal RV size and  systolic function.  Echo (6/14) with EF 20-25%, diffuse hypokinesis, mild LV dilation. Echo (1/15) with mild LV dilation, EF 25-30%, diffuse hypokinesis, prominent apical trabeculation, mildly dilated RV. CPX (3/15) with VO2 max 20.1, VE/VCO2 slope 34.7 (near normal when adjusted to respiratory compensation point) => this CPX showed normal functional capacity for his age.  - Echo (4/19): EF 25-30%, mild MR.  - RHC/LHC (4/19): No significant CAD.  Mean RA 6, PA 46/18 mean 32, mean PCWP 21, CI 2.01, PVR 2.4 WU (pulmonary venous hypertension).  - Echo (2/20): EF 30-35%, moderate diastolic dysfunction, normal RV size and systolic function, moderate LAE.  4. Hyperlipidemia   Family History:  Mother died with CHF at 47.   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  Quit smoking in 1996.  Occasional ETOH  Married with 3 children. Wife is now in dementia unit at [facility]    ROS: All systems reviewed and negative except as per HPI.   Current Outpatient Medications  Medication Sig Dispense Refill  . carvedilol (COREG) 25 MG tablet TAKE 1 TABLET BY MOUTH TWICE A DAY 180 tablet 1  . celecoxib (CELEBREX) 200 MG capsule Take 1 capsule (200 mg total) by mouth daily. 30 capsule 3  . cephALEXin (KEFLEX) 500 MG capsule Take 1 capsule (500 mg total) by mouth 2 (two) times daily. 14 capsule 0  . ENTRESTO 97-103 MG TAKE 1 TABLET BY MOUTH  TWICE A DAY 60 tablet 6  . isosorbide-hydrALAZINE (BIDIL) 20-37.5 MG tablet Take 2 tablets by mouth 3 (three) times daily. 180 tablet 11  . Multiple Vitamin (MULTIVITAMIN WITH MINERALS) TABS tablet Take 1 tablet by mouth daily.    . simvastatin (ZOCOR) 40 MG tablet Take 40 mg by mouth daily.     Marland Kitchen spironolactone (ALDACTONE) 25 MG tablet Take 1 tablet (25 mg total) by mouth daily. 30 tablet 6   No current facility-administered medications for this encounter.     BP 118/62   Pulse 75   Ht 5\' 10"  (1.778 m)   Wt 113.9 kg (251 lb)   SpO2 95%   BMI 36.01 kg/m  General: NAD Neck: No JVD, no thyromegaly or thyroid nodule.  Lungs: Clear to auscultation bilaterally with normal respiratory effort. CV: Nondisplaced PMI.  Heart regular S1/S2, no S3/S4, no murmur.  No peripheral edema.  No carotid bruit.  Normal pedal pulses.  Abdomen: Soft, nontender, no hepatosplenomegaly, no distention.  Skin: Intact without lesions or rashes.  Neurologic: Alert and oriented x 3.  Psych: Normal affect. Extremities: No clubbing or cyanosis.  HEENT: Normal.   Assessment/Plan: 1. Nonischemic cardiomyopathy: Possibly due to viral myocarditis versus HTN.  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 Last echo in 4/19 showed EF 25-30%, stable compared to the past.  No CAD on 4/19 cath, but CI marginal at 2.01 with mildly elevated PCWP. NYHA class II symptoms.  Not volume overloaded on exam.   - He did not get an ICD in the past with minimal symptoms and nonischemic etiology. He is not interested in an ICD at this point, would not push him given no CAD unless he is proven to have ventricular arrhythmias.   - Continue Coreg 25 mg bid.  - Continue spironolactone 25 daily.  - Continue Entresto 97/103 bid.  - Increase Bidil to 2 tabs tid.  - BMET today.  - Echo at followup in 4 months.  2. HTN: BP controlled.  3. Hyperlipidemia: Check lipids today.   Followup in 4 months with echo.    Loralie Champagne 04/19/2020

## 2020-05-23 ENCOUNTER — Other Ambulatory Visit (HOSPITAL_COMMUNITY): Payer: Self-pay | Admitting: Cardiology

## 2020-05-23 DIAGNOSIS — I428 Other cardiomyopathies: Secondary | ICD-10-CM

## 2020-05-25 DIAGNOSIS — Z96641 Presence of right artificial hip joint: Secondary | ICD-10-CM | POA: Diagnosis not present

## 2020-05-25 DIAGNOSIS — Z471 Aftercare following joint replacement surgery: Secondary | ICD-10-CM | POA: Diagnosis not present
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
## 2020-06-14 ENCOUNTER — Other Ambulatory Visit (HOSPITAL_COMMUNITY): Payer: Self-pay | Admitting: Cardiology

## 2020-07-16 ENCOUNTER — Other Ambulatory Visit: Payer: Self-pay | Admitting: Cardiology

## 2020-07-16 DIAGNOSIS — I428 Other cardiomyopathies: Secondary | ICD-10-CM

## 2020-07-20 ENCOUNTER — Other Ambulatory Visit (HOSPITAL_COMMUNITY): Payer: Self-pay | Admitting: Cardiology

## 2020-08-18 ENCOUNTER — Other Ambulatory Visit: Payer: Self-pay

## 2020-08-18 ENCOUNTER — Ambulatory Visit (HOSPITAL_COMMUNITY)
Admission: RE | Admit: 2020-08-18 | Discharge: 2020-08-18 | Disposition: A | Discharge status: Home / Self Care | Payer: PPO | Source: Ambulatory Visit | Attending: Cardiology | Admitting: Cardiology

## 2020-08-18 ENCOUNTER — Ambulatory Visit (HOSPITAL_BASED_OUTPATIENT_CLINIC_OR_DEPARTMENT_OTHER)
Admission: RE | Admit: 2020-08-18 | Discharge: 2020-08-18 | Disposition: A | Discharge status: Home / Self Care | Payer: PPO | Source: Ambulatory Visit | Attending: Cardiology | Admitting: Cardiology

## 2020-08-18 ENCOUNTER — Telehealth (HOSPITAL_COMMUNITY): Payer: Self-pay | Admitting: Pharmacy Technician

## 2020-08-18 ENCOUNTER — Encounter (HOSPITAL_COMMUNITY): Payer: Self-pay | Admitting: Cardiology

## 2020-08-18 VITALS — BP 118/78 | HR 76 | Wt 251.8 lb

## 2020-08-18 DIAGNOSIS — Z9989 Dependence on other enabling machines and devices: Secondary | ICD-10-CM | POA: Diagnosis not present

## 2020-08-18 DIAGNOSIS — Z87891 Personal history of nicotine dependence: Secondary | ICD-10-CM | POA: Diagnosis not present

## 2020-08-18 DIAGNOSIS — R262 Difficulty in walking, not elsewhere classified: Secondary | ICD-10-CM | POA: Diagnosis not present

## 2020-08-18 DIAGNOSIS — M25559 Pain in unspecified hip: Secondary | ICD-10-CM | POA: Insufficient documentation

## 2020-08-18 DIAGNOSIS — Z79899 Other long term (current) drug therapy: Secondary | ICD-10-CM | POA: Insufficient documentation

## 2020-08-18 DIAGNOSIS — I5022 Chronic systolic (congestive) heart failure: Secondary | ICD-10-CM | POA: Diagnosis not present

## 2020-08-18 DIAGNOSIS — I428 Other cardiomyopathies: Secondary | ICD-10-CM | POA: Insufficient documentation

## 2020-08-18 DIAGNOSIS — E785 Hyperlipidemia, unspecified: Secondary | ICD-10-CM | POA: Diagnosis not present

## 2020-08-18 DIAGNOSIS — I110 Hypertensive heart disease with heart failure: Secondary | ICD-10-CM | POA: Diagnosis not present

## 2020-08-18 DIAGNOSIS — I501 Left ventricular failure: Secondary | ICD-10-CM | POA: Diagnosis not present

## 2020-08-18 DIAGNOSIS — G629 Polyneuropathy, unspecified: Secondary | ICD-10-CM | POA: Diagnosis not present

## 2020-08-18 DIAGNOSIS — M549 Dorsalgia, unspecified: Secondary | ICD-10-CM | POA: Insufficient documentation

## 2020-08-18 DIAGNOSIS — Z8249 Family history of ischemic heart disease and other diseases of the circulatory system: Secondary | ICD-10-CM | POA: Insufficient documentation

## 2020-08-18 DIAGNOSIS — Z791 Long term (current) use of non-steroidal anti-inflammatories (NSAID): Secondary | ICD-10-CM | POA: Diagnosis not present

## 2020-08-18 DIAGNOSIS — Z7984 Long term (current) use of oral hypoglycemic drugs: Secondary | ICD-10-CM | POA: Insufficient documentation

## 2020-08-18 LAB — BASIC METABOLIC PANEL
Anion gap: 8 (ref 5–15)
BUN: 23 mg/dL (ref 8–23)
CO2: 22 mmol/L (ref 22–32)
Calcium: 9.4 mg/dL (ref 8.9–10.3)
Chloride: 105 mmol/L (ref 98–111)
Creatinine, Ser: 1.34 mg/dL — ABNORMAL HIGH (ref 0.61–1.24)
GFR calc Af Amer: 60 mL/min — ABNORMAL LOW (ref >60)
GFR calc non Af Amer: 51 mL/min — ABNORMAL LOW (ref >60)
Glucose, Bld: 108 mg/dL — ABNORMAL HIGH (ref 70–99)
Potassium: 4.6 mmol/L (ref 3.5–5.1)
Sodium: 135 mmol/L (ref 135–145)

## 2020-08-18 LAB — ECHOCARDIOGRAM COMPLETE
Area-P 1/2: 2.68 cm2
S' Lateral: 4.7 cm

## 2020-08-18 MED ORDER — EMPAGLIFLOZIN 10 MG PO TABS: 10.0000 mg | ORAL_TABLET | Freq: Every day | ORAL | 11 refills | Status: DC

## 2020-08-18 NOTE — Progress Notes (Signed)
Patient ID: Kevin Murillo, male   DOB: [birth year], 74 y.o.   MRN: [id number] PCP: Dr. Virgina Jock Cardiology: Dr. Aundra Dubin  74 y.o.with history of HTN and nonischemic cardiomyopathy (diagnosed in 2011) presents for cardiology followup.  He had an echo in 1/15, showing that EF remains 25-30% with mild LV dilation.  I had him do a cardiopulmonary exercise test in 3/15 that showed normal functional capacity.  Most recent echo in 4/19 showed EF remains 25-30%.  Repeat LHC/RHC in 4/19 showed no significant coronary disease, mildly elevated LV filling pressure, and CI 2.01.   Echo in 2/20 showed EF 30-35%, normal RV. Echo was done today and reviewed, EF 30% with mild LV dilation, diffuse hypokinesis.   He return for followup of CHF.  Using a walker when he walks long distances due to back and hip pain.  No exertional dyspnea or chest pain.  No lightheadedness.  No palpitations.  Not getting much exercise.  Weight is stable.   ECG (personally reviewed): NSR, poor RWP, LAFB   Labs (6/11): K 3.9, TSH normal, creatinine 1.0  Labs (7/11): HDL 60, LDL 122, K 4.4, creatinine 1.0, BNP 86, SPEP normal, transferrin saturation 24%  Labs (9/11): LDL 67, HDL 48, TGs 145, LFTs normal  Labs (10/11): K 4, creatinine 1.2  Labs (5/13): K 3.9, creatinine 1.0, LDL 66, HDL 44 Labs (7/14): K 3.9, creatinine 1.1 Labs (3/15): K 4.1, creatinine 1.1 Labs (8/15): K 4, creatinine 1.1, BNP 21 Labs (4/19): K 4.4, creatinine 1.06 Labs (5/19): K 4.4, creatinine 1.13 Labs (8/19): K 4.2, creatinine 1.29 Labs (11/19): K 4.1, creatinine 1.09 Labs (2/21): K 4.2, creatinine 1.63 Labs (5/21): K 4.3, creatinine 1.2, LDL 47  Allergies (verified):  No Known Drug Allergies   Past Medical History:  1. HTN  2. Left hand neuropathy  3. Nonischemic cardiomyopathy: Echo (6/11) with EF 30-35%, posteroinferior severe hypokinesis, moderate diastolic dysfunction, mild left atrial enlargement, mild LV hypertrophy. LHC (7/11): EF 30-35% with  diffuse hypokinesis, no angiographic CAD. SPEP and Fe studies unremarkable, TSH normal.  Repeat echo (1/12) with EF 45%, mild global hypokinesis, mild LVH, normal RV size and systolic function.  Echo (6/14) with EF 20-25%, diffuse hypokinesis, mild LV dilation. Echo (1/15) with mild LV dilation, EF 25-30%, diffuse hypokinesis, prominent apical trabeculation, mildly dilated RV. CPX (3/15) with VO2 max 20.1, VE/VCO2 slope 34.7 (near normal when adjusted to respiratory compensation point) => this CPX showed normal functional capacity for his age.  - Echo (4/19): EF 25-30%, mild MR.  - RHC/LHC (4/19): No significant CAD.  Mean RA 6, PA 46/18 mean 32, mean PCWP 21, CI 2.01, PVR 2.4 WU (pulmonary venous hypertension).  - Echo (2/20): EF 30-35%, moderate diastolic dysfunction, normal RV size and systolic function, moderate LAE.  - Echo (9/21): EF 30%, mild LV dilation, diffuse hypokinesis, normal RV.  4. Hyperlipidemia   Family History:  Mother died with CHF at 73.   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  Quit smoking in 1996.  Occasional ETOH  Married with 3 children. Wife is now in dementia unit at [facility].    ROS: All systems reviewed and negative except as per HPI.   Current Outpatient Medications  Medication Sig Dispense Refill  . carvedilol (COREG) 25 MG tablet TAKE 1 TABLET BY MOUTH TWICE A DAY 180 tablet 3  . celecoxib (CELEBREX) 200 MG capsule Take  1 capsule (200 mg total) by mouth daily. 30 capsule 3  . ENTRESTO 97-103 MG TAKE 1 TABLET BY MOUTH TWICE A DAY 60 tablet 6  . isosorbide-hydrALAZINE (BIDIL) 20-37.5 MG tablet Take 2 tablets by mouth 3 (three) times daily.    . Multiple Vitamin (MULTIVITAMIN WITH MINERALS) TABS tablet Take 1 tablet by mouth daily.    . simvastatin (ZOCOR) 40 MG tablet Take 40 mg by mouth daily.     Marland Kitchen spironolactone (ALDACTONE) 25 MG tablet TAKE 1  TABLET BY MOUTH EVERY DAY 90 tablet 3  . empagliflozin (JARDIANCE) 10 MG TABS tablet Take 1 tablet (10 mg total) by mouth daily before breakfast. 30 tablet 11   No current facility-administered medications for this encounter.    BP 118/78   Pulse 76   Wt 114.2 kg (251 lb 12.8 oz)   SpO2 96%   BMI 36.13 kg/m  General: NAD Neck: No JVD, no thyromegaly or thyroid nodule.  Lungs: Clear to auscultation bilaterally with normal respiratory effort. CV: Nondisplaced PMI.  Heart regular S1/S2, no S3/S4, no murmur.  No peripheral edema.  No carotid bruit.  Normal pedal pulses.  Abdomen: Soft, nontender, no hepatosplenomegaly, no distention.  Skin: Intact without lesions or rashes.  Neurologic: Alert and oriented x 3.  Psych: Normal affect. Extremities: No clubbing or cyanosis.  HEENT: Normal.   Assessment/Plan: 1. Nonischemic cardiomyopathy: Possibly due to viral myocarditis versus HTN.  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 Echo in 4/19 showed EF 25-30%.  No CAD on 4/19 cath, but CI marginal at 2.01 with mildly elevated PCWP.  Echo today showed EF fairly stable at 30%.  NYHA class II symptoms.  Not volume overloaded on exam.   - He did not get an ICD in the past with minimal symptoms and nonischemic etiology. He is not interested in an ICD at this point, would not push him given no CAD unless he is proven to have ventricular arrhythmias.   - Continue Coreg 25 mg bid.  - Continue spironolactone 25 daily.  - Continue Entresto 97/103 bid.  - Continue Bidil 2 tabs tid.  - I would like him to start dapagliflozin or empagliflozin, will see if his insurance covers.  - BMET today and repeat in 10 days if he starts SGLT2 inhibitor.  2. HTN: BP controlled.  3. Hyperlipidemia: Good lipids in 5/21.   Followup in 4 months  Loralie Champagne 08/18/2020

## 2020-08-18 NOTE — Telephone Encounter (Signed)
Patient was seen in clinic today and started on Jardiance. His current co-pay is $45 and is a barrier for him. Started an application for [employer]. Will fax in once signatures obtained.  Will follow up.

## 2020-08-18 NOTE — Progress Notes (Signed)
  Echocardiogram 2D Echocardiogram has been performed.  Kevin Murillo 08/18/2020, 9:58 AM

## 2020-08-18 NOTE — Patient Instructions (Addendum)
START Jardiance 10mg  (1 tab) daily.  We will work on getting you assistance to help pay for this medication  Labs today and in 10 days We will only contact you if something comes back abnormal or we need to make some changes. Otherwise no news is good news!  Your physician recommends that you schedule a follow-up appointment in: 4 months with Dr Aundra Dubin  Please call office at [phone] option 2 if you have any questions or concerns.   At the [facility], you and your health needs are our priority. As part of our continuing mission to provide you with exceptional heart care, we have created designated Provider Care Teams. These Care Teams include your primary Cardiologist (physician) and Advanced Practice Providers (APPs- Physician Assistants and Nurse Practitioners) who all work together to provide you with the care you need, when you need it.   You may see any of the following providers on your designated Care Team at your next follow up: Marland Kitchen Dr Glori Bickers . Dr Loralie Champagne . Darrick Grinder, NP . Lyda Jester, PA . Audry Riles, PharmD   Please be sure to bring in all your medications bottles to every appointment.

## 2020-08-19 ENCOUNTER — Telehealth (HOSPITAL_COMMUNITY): Payer: Self-pay | Admitting: Pharmacy Technician

## 2020-08-19 NOTE — Telephone Encounter (Signed)
Sent in application via fax.  Will follow up.  

## 2020-08-19 NOTE — Telephone Encounter (Signed)
Patient Advocate Encounter   Received notification from [place] that prior authorization for Kevin Murillo is required.   PA submitted on CoverMyMeds Key  [id number] Status is pending   Will continue to follow.

## 2020-08-20 NOTE — Telephone Encounter (Signed)
Advanced Heart Failure Patient Advocate Encounter   Patient was approved to receive Jardiance from [place]  Effective dates: 08/19/20 through 11/27/20

## 2020-08-21 NOTE — Telephone Encounter (Signed)
Advanced Heart Failure Patient Advocate Encounter  Prior Authorization for Carmin Muskrat has been approved.     Effective dates: 08/19/20 through 08/19/21  Patients co-pay is $47  Called and left patient a message to confirm if the co-pay is affordable.  Charlann Boxer, CPhT

## 2020-08-21 NOTE — Telephone Encounter (Signed)
Called and left the patient a message regarding the approval.  Charlann Boxer, CPhT

## 2020-08-27 ENCOUNTER — Ambulatory Visit (HOSPITAL_COMMUNITY)
Admission: RE | Admit: 2020-08-27 | Discharge: 2020-08-27 | Disposition: A | Discharge status: Home / Self Care | Payer: PPO | Source: Ambulatory Visit | Attending: Internal Medicine | Admitting: Internal Medicine

## 2020-08-27 ENCOUNTER — Other Ambulatory Visit: Payer: Self-pay

## 2020-08-27 DIAGNOSIS — I5022 Chronic systolic (congestive) heart failure: Secondary | ICD-10-CM | POA: Insufficient documentation

## 2020-08-27 LAB — BASIC METABOLIC PANEL
Anion gap: 10 (ref 5–15)
BUN: 27 mg/dL — ABNORMAL HIGH (ref 8–23)
CO2: 25 mmol/L (ref 22–32)
Calcium: 9.6 mg/dL (ref 8.9–10.3)
Chloride: 103 mmol/L (ref 98–111)
Creatinine, Ser: 1.46 mg/dL — ABNORMAL HIGH (ref 0.61–1.24)
GFR calc Af Amer: 54 mL/min — ABNORMAL LOW (ref >60)
GFR calc non Af Amer: 46 mL/min — ABNORMAL LOW (ref >60)
Glucose, Bld: 82 mg/dL (ref 70–99)
Potassium: 4.4 mmol/L (ref 3.5–5.1)
Sodium: 138 mmol/L (ref 135–145)

## 2020-11-13 ENCOUNTER — Telehealth (HOSPITAL_COMMUNITY): Payer: Self-pay | Admitting: Pharmacy Technician

## 2020-11-13 NOTE — Telephone Encounter (Signed)
Patient called in leaving a voicemail regarding the PAN grant we attempted to sign him up for.  Called him back and asked for him to return my call.  Charlann Boxer, CPhT

## 2020-11-13 NOTE — Telephone Encounter (Signed)
Patient Advocate Encounter   Received notification from [employer] that prior authorization for Kevin Murillo is required.   PA submitted on CoverMyMeds Key [id number] Status is pending   Will continue to follow.  Attempted to sign patient up for the PAN grant and they closed the grant as they were out of funding.  Spoke with the patient and will have applications for [employer] and [employer] at the front check in desk for him to sign.

## 2020-11-16 DIAGNOSIS — Z96641 Presence of right artificial hip joint: Secondary | ICD-10-CM | POA: Diagnosis not present

## 2020-11-16 NOTE — Telephone Encounter (Signed)
Could not submit PA through [facility], by the time the questions loaded [employer] denied the claim.   Called [phone] and submitted an appeal over the phone. The standard timeframe to get a decision on this appeal is 7 days.  Will follow up.

## 2020-11-17 NOTE — Telephone Encounter (Addendum)
Advanced Heart Failure Patient Advocate Encounter  Prior Authorization for Delene Loll has been approved.    Effective dates: 11/16/20 through 11/16/21  Charlann Boxer, CPhT

## 2020-11-26 ENCOUNTER — Emergency Department (HOSPITAL_COMMUNITY): Payer: PPO

## 2020-11-26 ENCOUNTER — Other Ambulatory Visit: Payer: Self-pay

## 2020-11-26 ENCOUNTER — Observation Stay (HOSPITAL_COMMUNITY)
Admission: EM | Admit: 2020-11-26 | Discharge: 2020-11-27 | Disposition: A | Discharge status: Home / Self Care | Payer: PPO | Attending: Emergency Medicine | Admitting: Emergency Medicine

## 2020-11-26 DIAGNOSIS — Z87891 Personal history of nicotine dependence: Secondary | ICD-10-CM | POA: Diagnosis not present

## 2020-11-26 DIAGNOSIS — I5022 Chronic systolic (congestive) heart failure: Secondary | ICD-10-CM | POA: Insufficient documentation

## 2020-11-26 DIAGNOSIS — I428 Other cardiomyopathies: Secondary | ICD-10-CM

## 2020-11-26 DIAGNOSIS — I959 Hypotension, unspecified: Secondary | ICD-10-CM

## 2020-11-26 DIAGNOSIS — Z20822 Contact with and (suspected) exposure to covid-19: Secondary | ICD-10-CM | POA: Insufficient documentation

## 2020-11-26 DIAGNOSIS — I493 Ventricular premature depolarization: Secondary | ICD-10-CM | POA: Diagnosis not present

## 2020-11-26 DIAGNOSIS — Z79899 Other long term (current) drug therapy: Secondary | ICD-10-CM | POA: Insufficient documentation

## 2020-11-26 DIAGNOSIS — R6889 Other general symptoms and signs: Secondary | ICD-10-CM | POA: Diagnosis not present

## 2020-11-26 DIAGNOSIS — A419 Sepsis, unspecified organism: Secondary | ICD-10-CM | POA: Diagnosis not present

## 2020-11-26 DIAGNOSIS — R0902 Hypoxemia: Secondary | ICD-10-CM | POA: Diagnosis not present

## 2020-11-26 DIAGNOSIS — Z743 Need for continuous supervision: Secondary | ICD-10-CM | POA: Diagnosis not present

## 2020-11-26 DIAGNOSIS — Z96641 Presence of right artificial hip joint: Secondary | ICD-10-CM | POA: Diagnosis not present

## 2020-11-26 DIAGNOSIS — I517 Cardiomegaly: Secondary | ICD-10-CM | POA: Diagnosis not present

## 2020-11-26 DIAGNOSIS — R55 Syncope and collapse: Secondary | ICD-10-CM | POA: Diagnosis not present

## 2020-11-26 DIAGNOSIS — I110 Hypertensive heart disease with heart failure: Secondary | ICD-10-CM | POA: Insufficient documentation

## 2020-11-26 DIAGNOSIS — I499 Cardiac arrhythmia, unspecified: Secondary | ICD-10-CM | POA: Diagnosis not present

## 2020-11-26 DIAGNOSIS — R404 Transient alteration of awareness: Secondary | ICD-10-CM | POA: Diagnosis not present

## 2020-11-26 LAB — TROPONIN I (HIGH SENSITIVITY)
Troponin I (High Sensitivity): 21 ng/L — ABNORMAL HIGH (ref <18)
Troponin I (High Sensitivity): 19 ng/L — ABNORMAL HIGH (ref <18)

## 2020-11-26 LAB — URINALYSIS, ROUTINE W REFLEX MICROSCOPIC
Bilirubin Urine: NEGATIVE
Glucose, UA: >=500 mg/dL — AB
Ketones, ur: NEGATIVE mg/dL
Nitrite: POSITIVE — AB
Protein, ur: 30 mg/dL — AB
Specific Gravity, Urine: 1.017 (ref 1.005–1.030)
WBC, UA: >50 WBC/hpf — ABNORMAL HIGH (ref 0–5)
pH: 5 (ref 5.0–8.0)

## 2020-11-26 LAB — BRAIN NATRIURETIC PEPTIDE: B Natriuretic Peptide: 128 pg/mL — ABNORMAL HIGH (ref 0.0–100.0)

## 2020-11-26 LAB — CBC WITH DIFFERENTIAL/PLATELET
Abs Immature Granulocytes: 0.01 10*3/uL (ref 0.00–0.07)
Basophils Absolute: 0.1 10*3/uL (ref 0.0–0.1)
Basophils Relative: 1 %
Eosinophils Absolute: 0.1 10*3/uL (ref 0.0–0.5)
Eosinophils Relative: 2 %
HCT: 39.1 % (ref 39.0–52.0)
Hemoglobin: 11.8 g/dL — ABNORMAL LOW (ref 13.0–17.0)
Immature Granulocytes: 0 %
Lymphocytes Relative: 34 %
Lymphs Abs: 2 10*3/uL (ref 0.7–4.0)
MCH: 29.2 pg (ref 26.0–34.0)
MCHC: 30.2 g/dL (ref 30.0–36.0)
MCV: 96.8 fL (ref 80.0–100.0)
Monocytes Absolute: 0.6 10*3/uL (ref 0.1–1.0)
Monocytes Relative: 11 %
Neutro Abs: 3 10*3/uL (ref 1.7–7.7)
Neutrophils Relative %: 52 %
Platelets: 199 10*3/uL (ref 150–400)
RBC: 4.04 MIL/uL — ABNORMAL LOW (ref 4.22–5.81)
RDW: 14.4 % (ref 11.5–15.5)
WBC: 5.9 10*3/uL (ref 4.0–10.5)
nRBC: 0 % (ref 0.0–0.2)

## 2020-11-26 LAB — RAPID URINE DRUG SCREEN, HOSP PERFORMED
Amphetamines: NOT DETECTED
Barbiturates: NOT DETECTED
Benzodiazepines: NOT DETECTED
Cocaine: NOT DETECTED
Opiates: NOT DETECTED
Tetrahydrocannabinol: NOT DETECTED

## 2020-11-26 LAB — PROTIME-INR
INR: 1 (ref 0.8–1.2)
Prothrombin Time: 13.1 seconds (ref 11.4–15.2)

## 2020-11-26 LAB — COMPREHENSIVE METABOLIC PANEL
ALT: 14 U/L (ref 0–44)
AST: 16 U/L (ref 15–41)
Albumin: 3.3 g/dL — ABNORMAL LOW (ref 3.5–5.0)
Alkaline Phosphatase: 54 U/L (ref 38–126)
Anion gap: 10 (ref 5–15)
BUN: 26 mg/dL — ABNORMAL HIGH (ref 8–23)
CO2: 21 mmol/L — ABNORMAL LOW (ref 22–32)
Calcium: 8.9 mg/dL (ref 8.9–10.3)
Chloride: 108 mmol/L (ref 98–111)
Creatinine, Ser: 1.71 mg/dL — ABNORMAL HIGH (ref 0.61–1.24)
GFR, Estimated: 41 mL/min — ABNORMAL LOW (ref >60)
Glucose, Bld: 85 mg/dL (ref 70–99)
Potassium: 4.3 mmol/L (ref 3.5–5.1)
Sodium: 139 mmol/L (ref 135–145)
Total Bilirubin: 0.5 mg/dL (ref 0.3–1.2)
Total Protein: 6.6 g/dL (ref 6.5–8.1)

## 2020-11-26 LAB — LACTIC ACID, PLASMA
Lactic Acid, Venous: 1.5 mmol/L (ref 0.5–1.9)
Lactic Acid, Venous: 1.2 mmol/L (ref 0.5–1.9)

## 2020-11-26 LAB — RESP PANEL BY RT-PCR (FLU A&B, COVID) ARPGX2
Influenza A by PCR: NEGATIVE
Influenza B by PCR: NEGATIVE
SARS Coronavirus 2 by RT PCR: NEGATIVE

## 2020-11-26 LAB — CBG MONITORING, ED: Glucose-Capillary: 102 mg/dL — ABNORMAL HIGH (ref 70–99)

## 2020-11-26 LAB — MAGNESIUM: Magnesium: 2.1 mg/dL (ref 1.7–2.4)

## 2020-11-26 LAB — APTT: aPTT: 26 seconds (ref 24–36)

## 2020-11-26 MED ORDER — SODIUM CHLORIDE 0.9% FLUSH
3.0000 mL | Freq: Two times a day (BID) | INTRAVENOUS | Status: DC
  Administered 2020-11-27: 3 mL via INTRAVENOUS

## 2020-11-26 MED ORDER — SACUBITRIL-VALSARTAN 97-103 MG PO TABS
1.0000 | ORAL_TABLET | Freq: Two times a day (BID) | ORAL | Status: DC
  Administered 2020-11-27: 1 via ORAL
  Filled 2020-11-26 (×2): qty 1

## 2020-11-26 MED ORDER — ONDANSETRON HCL 4 MG/2ML IJ SOLN: 4.0000 mg | Freq: Four times a day (QID) | INTRAMUSCULAR | Status: DC | PRN

## 2020-11-26 MED ORDER — ONDANSETRON HCL 4 MG PO TABS: 4.0000 mg | ORAL_TABLET | Freq: Four times a day (QID) | ORAL | Status: DC | PRN

## 2020-11-26 MED ORDER — ACETAMINOPHEN 650 MG RE SUPP: 650.0000 mg | Freq: Four times a day (QID) | RECTAL | Status: DC | PRN

## 2020-11-26 MED ORDER — CARVEDILOL 25 MG PO TABS
25.0000 mg | ORAL_TABLET | Freq: Two times a day (BID) | ORAL | Status: DC
  Administered 2020-11-27: 25 mg via ORAL
  Filled 2020-11-26: qty 1

## 2020-11-26 MED ORDER — SODIUM CHLORIDE 0.9 % IV SOLN
2.0000 g | Freq: Once | INTRAVENOUS | Status: AC
  Administered 2020-11-26: 16:00:00 2 g via INTRAVENOUS
  Filled 2020-11-26: qty 20

## 2020-11-26 MED ORDER — ACETAMINOPHEN 325 MG PO TABS: 650.0000 mg | ORAL_TABLET | Freq: Four times a day (QID) | ORAL | Status: DC | PRN

## 2020-11-26 MED ORDER — SODIUM CHLORIDE 0.9 % IV BOLUS
500.0000 mL | Freq: Once | INTRAVENOUS | Status: AC
  Administered 2020-11-26: 16:00:00 500 mL via INTRAVENOUS

## 2020-11-26 NOTE — Consult Note (Signed)
Cardiology Consultation:   Patient ID: Kevin Murillo MRN: [id number]; DOB: [birth year]  Admit date: 11/26/2020 Date of Consult: 11/26/2020  Primary Care Provider: Creola Corn, MD [facility] Heart Failure:  Dr. Shirlee Latch   Patient Profile:   Kevin Murillo is a 74 y.o. male with a hx of nonischemic cardiomyopathy who is being seen today for the evaluation of syncope at the request of Dr. Benjamine Mola.  History of Present Illness:   Kevin Murillo is followed by Dr. Shirlee Latch in the advanced heart failure clinic. He was in his usual state of health until earlier in the day. He was eating at a restaurant and was feeling fine; he then remembers waking up on the floor with EMS around him. No prodrome. Collateral history provided by the other diner noted that he was resting his head on his arms, on the table. He then stopped responding. The waitress helped him get to the floor. He believes he was nonresponsive for only a short period of time. He woke up and did not have any post-syncopal symptoms. He denies any chest pain or palpitations. He has only had one other syncopal episode, about a year ago at the same restaurant. On arrival, EMS noted that he was hypotensive.   He denies fevers, chills, or shortness of breath. Has been eating and drinking normally.   Since arriving in the ER, he has been in sinus rhythm with frequent PVCs, but he has not had any VT captured. He feels well currently. He is being observed overnight, and cardiology is consulted for further recommendations.   Past Medical History:  Diagnosis Date  . Chronic systolic CHF (congestive heart failure), NYHA class 1 (HCC)   . History of echocardiogram    Echo 3/16: Mild LVH, EF 40-45%, anteroseptal akinesis, grade 1 diastolic dysfunction, moderate LAE // Echo 3/19: diff HK worse in basal and mid inf wall, mild LVH, EF 25-30, mild MR, mild LAE  . Hyperlipidemia   . Hypertension   . Neuropathy of hand   . Non-ischemic  cardiomyopathy (HCC)    a. Echo 6/11: 30-35% // b. LHC 7/11: EF 30-35% with diffuse hypokinesis, no angiographic CAD. SPEP and Fe studies unremarkable, TSH normal.//  c. Echo 1/12: EF 45% // d. Echo 6/14: EF 20-25%  //  e. Echo EF 25-30%, diffuse hypokinesis, prominent apical trabeculation // f. CPX 3/15: normal fxnal capacity  // g. CPX 3/16 low norma fxnal capacity  // h. Echo 3/16: mild LVH, EF 40-45%   . Obesity   . PVC (premature ventricular contraction)   . Rosacea   . Tubular adenoma of colon 12/2010    Past Surgical History:  Procedure Laterality Date  . ARM SURGERY  1967   RIGHT   . COLONOSCOPY    . COLONOSCOPY WITH PROPOFOL N/A 08/28/2019   Procedure: COLONOSCOPY WITH PROPOFOL;  Surgeon: Iva Boop, MD;  Location: [facility];  Service: Endoscopy;  Laterality: N/A;  . CYST EXCISION Left 07/29/2019   Procedure: EXCISION OF CHRONIC LEFT LOWER BACK CYST;  Surgeon: Abigail Miyamoto, MD;  Location: [facility] OR;  Service: General;  Laterality: Left;  . KNEE ARTHROSCOPY Left   . RIGHT/LEFT HEART CATH AND CORONARY ANGIOGRAPHY N/A 03/09/2018   Procedure: RIGHT/LEFT HEART CATH AND CORONARY ANGIOGRAPHY;  Surgeon: Laurey Morale, MD;  Location: [facility] INVASIVE CV LAB;  Service: Cardiovascular;  Laterality: N/A;  . TOTAL HIP ARTHROPLASTY Right 11/27/2019   Procedure: TOTAL HIP ARTHROPLASTY ANTERIOR APPROACH;  Surgeon: Samson Frederic, MD;  Location:  [facility] ORS;  Service: Orthopedics;  Laterality: Right;     Home Medications:  Prior to Admission medications   Medication Sig Start Date End Date Taking? Authorizing Provider  aspirin EC 81 MG tablet Take 81 mg by mouth daily. Swallow whole.   Yes [provider]  carvedilol (COREG) 25 MG tablet TAKE 1 TABLET BY MOUTH TWICE A DAY Patient taking differently: Take 25 mg by mouth 2 (two) times daily with a meal. 07/16/20  Yes Larey Dresser, MD  empagliflozin (JARDIANCE) 10 MG TABS tablet Take 1 tablet (10 mg total) by mouth daily before  breakfast. 08/18/20  Yes Larey Dresser, MD  ENTRESTO 97-103 MG TAKE 1 TABLET BY MOUTH TWICE A DAY Patient taking differently: Take 1 tablet by mouth 2 (two) times daily. 07/20/20  Yes Larey Dresser, MD  isosorbide-hydrALAZINE (BIDIL) 20-37.5 MG tablet Take 2 tablets by mouth 3 (three) times daily.   Yes [provider]  Multiple Vitamin (MULTIVITAMIN WITH MINERALS) TABS tablet Take 1 tablet by mouth daily.   Yes [provider]  simvastatin (ZOCOR) 40 MG tablet Take 40 mg by mouth daily.  05/16/16  Yes [provider]  spironolactone (ALDACTONE) 25 MG tablet TAKE 1 TABLET BY MOUTH EVERY DAY Patient taking differently: Take 25 mg by mouth daily. 05/25/20  Yes Larey Dresser, MD    Inpatient Medications: Scheduled Meds: . [START ON 11/27/2020] carvedilol  25 mg Oral BID  . [START ON 11/27/2020] sacubitril-valsartan  1 tablet Oral BID  . sodium chloride flush  3 mL Intravenous Q12H   Continuous Infusions:  PRN Meds: acetaminophen **OR** acetaminophen, ondansetron **OR** ondansetron (ZOFRAN) IV  Allergies:   No Known Allergies  Social History:   Social History   Socioeconomic History  . Marital status: Married    Spouse name: Not on file  . Number of children: 2  . Years of education: Not on file  . Highest education level: Not on file  Occupational History  . Occupation: Retired Games developer: [place]  . Occupation: Buyer, retail  Tobacco Use  . Smoking status: Former Smoker    Types: Cigarettes    Quit date: 03/12/2002    Years since quitting: 18.7  . Smokeless tobacco: Never Used  Vaping Use  . Vaping Use: Never used  Substance and Sexual Activity  . Alcohol use: Yes    Alcohol/week: 2.0 standard drinks    Types: 1 Cans of beer, 1 Standard drinks or equivalent per week    Comment: Socially  . Drug use: No  . Sexual activity: Not on file  Other Topics Concern  . 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 CVA Mother   . Heart attack Other   . Gout Son   . Colon cancer Neg Hx      ROS:  Please see the history of present illness.  Constitutional: Negative for chills, fever, night sweats, unintentional weight loss  HENT: Negative for ear pain and hearing loss.   Eyes: Negative for loss of vision and eye pain.  Respiratory: Negative for cough, sputum, wheezing.   Cardiovascular: See HPI. Gastrointestinal: Negative for abdominal pain, melena, and hematochezia.  Genitourinary: Negative for dysuria and hematuria.  Musculoskeletal: Negative for falls and myalgias.  Skin: Negative for itching and rash.  Neurological: Negative for focal weakness, focal sensory changes Endo/Heme/Allergies: Does not bruise/bleed easily.  All other ROS reviewed and negative.     Physical Exam/Data:   Vitals:   11/26/20 1430 11/26/20 1540 11/26/20 1630 11/26/20 1645  BP: (!) 84/67 (!) 111/58 103/68 96/66  Pulse: 60 60 60 68  Resp: 12 (!) 21 14   Temp:      TempSrc:      SpO2: 96% 100% 96% 96%  Weight:      Height:       No intake or output data in the 24 hours ending 11/26/20 1819 Last 3 Weights 11/26/2020 08/18/2020 04/17/2020  Weight (lbs) 245 lb  251 lb 12.8 oz 251 lb  Weight (kg) 111.131 kg 114.216 kg 113.853 kg     Body mass index is 36.18 kg/m.  General:  Well nourished, well developed, in no acute distress HEENT: normal Neck: no JVD Endocrine:  No thryomegaly Vascular: bilateral radial arteries 2+  Cardiac:  normal S1, S2; RRR; no murmur appreciated Lungs:  clear to auscultation bilaterally, no wheezing, rhonchi or rales  Abd: soft, nontender, no hepatomegaly  Ext: no edema Musculoskeletal:  No deformities, BUE and BLE strength normal and equal Skin: warm and dry  Neuro:  no focal abnormalities noted Psych:  Normal affect   EKG:  The EKG was personally reviewed and demonstrates:  Sinus rhythm with PVC Telemetry:  Telemetry was personally reviewed and demonstrates:  Sinus rhythm with PVCs but no NSVT  Relevant CV Studies: Echo 08/18/20 1. Left ventricular ejection fraction, by estimation, is 30%. The left  ventricle has moderate to severely decreased function. The left ventricle  demonstrates global hypokinesis. The left ventricular internal cavity size  was mildly dilated. Left  ventricular diastolic parameters are consistent with Grade I diastolic  dysfunction (impaired relaxation).  2. Right ventricular systolic function is normal. The right ventricular  size is normal. Tricuspid regurgitation signal is inadequate for assessing  PA pressure.  3. The mitral valve is normal in structure. No evidence of mitral valve  regurgitation. No evidence of mitral stenosis.  4. The aortic valve is tricuspid. Aortic valve regurgitation is not  visualized. No aortic stenosis is present.  5. The inferior vena cava is normal in size with greater than 50%  respiratory variability, suggesting right atrial pressure of 3 mmHg.    Laboratory Data:  High Sensitivity Troponin:   Recent Labs  Lab 11/26/20 1521  TROPONINIHS 21*     Chemistry Recent Labs  Lab 11/26/20 1447  NA 139  K 4.3  CL 108  CO2 21*  GLUCOSE 85   BUN 26*  CREATININE 1.71*  CALCIUM 8.9  GFRNONAA 41*  ANIONGAP 10    Recent Labs  Lab 11/26/20  1447  PROT 6.6  ALBUMIN 3.3*  AST 16  ALT 14  ALKPHOS 54  BILITOT 0.5   Hematology Recent Labs  Lab 11/26/20 1447  WBC 5.9  RBC 4.04*  HGB 11.8*  HCT 39.1  MCV 96.8  MCH 29.2  MCHC 30.2  RDW 14.4  PLT 199   BNP Recent Labs  Lab 11/26/20 1526  BNP 128.0*    DDimer No results for input(s): DDIMER in the last 168 hours.   Radiology/Studies:  CT Head Wo Contrast  Result Date: 11/26/2020 CLINICAL DATA:  Syncope EXAM: CT HEAD WITHOUT CONTRAST TECHNIQUE: Contiguous axial images were obtained from the base of the skull through the vertex without intravenous contrast. COMPARISON:  None. FINDINGS: Brain: No evidence of acute infarction, hemorrhage, hydrocephalus, extra-axial collection or mass lesion/mass effect. Vascular: Negative for hyperdense vessel Skull: Negative Sinuses/Orbits: Paranasal sinuses clear.  Negative orbit Other: None IMPRESSION: Negative CT head Electronically Signed   By: Franchot Gallo M.D.   On: 11/26/2020 14:50   DG Chest Port 1 View  Result Date: 11/26/2020 CLINICAL DATA:  Questionable sepsis - evaluate for abnormality EXAM: PORTABLE CHEST 1 VIEW COMPARISON:  None. FINDINGS: No pneumothorax or pleural effusion. Minimal bibasilar opacities. Diffuse interstitial prominence likely secondary to hypoinflation. Mild cardiomegaly. No acute osseous abnormality. IMPRESSION: Minimal basilar opacities, likely atelectasis versus scarring. Mild cardiomegaly. Electronically Signed   By: Primitivo Gauze M.D.   On: 11/26/2020 14:10     Assessment and Plan:   Syncope: -unclear cause. Concern is for lack of prodrome. -has frequent PVCs on telemetry but no NSVT -has recent echo 07/2020, followed closely by Dr. Aundra Dubin -he was hypotensive on awakening. Unclear what his pressures were before the event. May have been hypotension, but would have expected  prodrome -agree with monitoring on telemetry.  -would consider live telemetry monitor at discharge for assessment of possible ventricular arrhythmias -labs unrevealing; Mg and K normal, hsTnI upper limit of normal and flat. Lactate normal -does have slight increase in Cr and UA concerning for possible UTI.  -would hold SGLT2i given UA and Cr findings.  NICM, chronic systolic and diastolic heart failure: -appears euvolemic on exam, denies recent heart failure symptoms -if blood pressure remains normal, would restart carvedilol and monitor closely given PVCs.  -hold Jardiance as above -would hold entresto, bidil, spironolactone until blood pressures stable on carvedilol. Monitor BP closely  We will review vitals and telemetry in the AM and leave additional recommendations at that time.  For questions or updates, please contact [facility] Please consult [email or web address] for contact info under    Signed, Buford Dresser, MD  11/26/2020 6:19 PM

## 2020-11-26 NOTE — ED Notes (Signed)
Pt ambulated ~174ft unassisted on RA w/o complaint maintaining steady gait.

## 2020-11-26 NOTE — H&P (Signed)
History and Physical    Kevin Murillo [id number] DOB: Sep 25, 1946 DOA: 11/26/2020  I have briefly reviewed the patient's prior medical records in [facility]  PCP: Shon Baton, MD  Patient coming from: restaurant/home   Chief Complaint: syncopal event with loss of bladder function  HPI: Kevin Murillo is a 74 y.o. male with medical history significant of chronic systolic heart failure, HTN, HLD.    Patient was brought in by EMS after syncopal rising this a.m. after eating breakfast.  Patient reports that he had finished eating breakfast and was visiting with a friend when the friend stated he fell forward onto the table and the friend called the waitress who lowered him to the floor.  Reported seizure activity but patient did have loss of bladder control.  It is unclear how long patient was unconscious for.  Patient only remembers speaking with a friend and then waking up when EMS arrived.  Patient does not have this friend's phone number to verify accounts of the event.  Patient states he had a similar episode last year but when EMS came "his numbers were okay" so he went home. Patient denies any recent illness.  He follows with Dr. Aundra Dubin.  Only new medication is Jardiance.  In the ER he was noticed to have increasing amounts of PVCs with R on T.  He was also thought to have a suspected urinary tract infection.  Patient currently denies symptoms of dysuria and increasing frequency.  As were done and were negative.  As there is concern for cardiogenic syncope x2, cardiology consult was obtained.  Magnesium levels pending.  Observation telemetry orders placed.   Review of Systems: As per HPI otherwise 10 point review of systems negative.   Past Medical History:  Diagnosis Date  . Chronic systolic CHF (congestive heart failure), NYHA class 1 ([place])   . History of echocardiogram    Echo 3/16: Mild LVH, EF 40-45%, anteroseptal akinesis, grade 1 diastolic dysfunction,  moderate LAE // Echo 3/19: diff HK worse in basal and mid inf wall, mild LVH, EF 25-30, mild MR, mild LAE  . Hyperlipidemia   . Hypertension   . Neuropathy of hand   . Non-ischemic cardiomyopathy ([place])    a. Echo 6/11: 30-35% // b. LHC 7/11: EF 30-35% with diffuse hypokinesis, no angiographic CAD. SPEP and Fe studies unremarkable, TSH normal.//  c. Echo 1/12: EF 45% // d. Echo 6/14: EF 20-25%  //  e. Echo EF 25-30%, diffuse hypokinesis, prominent apical trabeculation // f. CPX 3/15: normal fxnal capacity  // g. CPX 3/16 low norma fxnal capacity  // h. Echo 3/16: mild LVH, EF 40-45%   . Obesity   . PVC (premature ventricular contraction)   . Rosacea   . Tubular adenoma of colon 12/2010    Past Surgical History:  Procedure Laterality Date  . ARM SURGERY  1967   RIGHT   . COLONOSCOPY    . COLONOSCOPY WITH PROPOFOL N/A 08/28/2019   Procedure: COLONOSCOPY WITH PROPOFOL;  Surgeon: Gatha Mayer, MD;  Location: [facility];  Service: Endoscopy;  Laterality: N/A;  . CYST EXCISION Left 07/29/2019   Procedure: EXCISION OF CHRONIC LEFT LOWER BACK CYST;  Surgeon: Coralie Keens, MD;  Location: [place];  Service: General;  Laterality: Left;  . KNEE ARTHROSCOPY Left   . RIGHT/LEFT HEART CATH AND CORONARY ANGIOGRAPHY N/A 03/09/2018   Procedure: RIGHT/LEFT HEART CATH AND CORONARY ANGIOGRAPHY;  Surgeon: Larey Dresser, MD;  Location: [facility] INVASIVE CV  LAB;  Service: Cardiovascular;  Laterality: N/A;  . TOTAL HIP ARTHROPLASTY Right 11/27/2019   Procedure: TOTAL HIP ARTHROPLASTY ANTERIOR APPROACH;  Surgeon: Rod Can, MD;  Location: [facility]S;  Service: Orthopedics;  Laterality: Right;     reports that he quit smoking about 18 years ago. His smoking use included cigarettes. He has never used smokeless tobacco. He reports current alcohol use of about 2.0 standard drinks of alcohol per week. He reports that he does not use drugs.  No Known Allergies  Family History  Problem Relation Age of Onset  .  CVA Mother   . Heart attack Other   . Gout Son   . Colon cancer Neg Hx     Prior to Admission medications   Medication Sig Start Date End Date Taking? Authorizing Provider  carvedilol (COREG) 25 MG tablet TAKE 1 TABLET BY MOUTH TWICE A DAY 07/16/20   Larey Dresser, MD  celecoxib (CELEBREX) 200 MG capsule Take 1 capsule (200 mg total) by mouth daily. 11/28/19   Swinteck, Aaron Edelman, MD  empagliflozin (JARDIANCE) 10 MG TABS tablet Take 1 tablet (10 mg total) by mouth daily before breakfast. 08/18/20   Larey Dresser, MD  ENTRESTO 97-103 MG TAKE 1 TABLET BY MOUTH TWICE A DAY 07/20/20   Larey Dresser, MD  isosorbide-hydrALAZINE (BIDIL) 20-37.5 MG tablet Take 2 tablets by mouth 3 (three) times daily.    [provider]  Multiple Vitamin (MULTIVITAMIN WITH MINERALS) TABS tablet Take 1 tablet by mouth daily.    [provider]  simvastatin (ZOCOR) 40 MG tablet Take 40 mg by mouth daily.  05/16/16   [provider]  spironolactone (ALDACTONE) 25 MG tablet TAKE 1 TABLET BY MOUTH EVERY DAY 05/25/20   Larey Dresser, MD    Physical Exam: Vitals:   11/26/20 1430 11/26/20 1540 11/26/20 1630 11/26/20 1645  BP: (!) 84/67 (!) 111/58 103/68 96/66  Pulse: 60 60 60 68  Resp: 12 (!) 21 14   Temp:      TempSrc:      SpO2: 96% 100% 96% 96%  Weight:      Height:          Constitutional: NAD, calm, comfortable ENMT: Mucous membranes are moist.  Neck: normal, supple, no masses, no thyromegaly Respiratory: diminished Normal respiratory effort. No accessory muscle use.  Cardiovascular: +murmur, reg at times, irregular (PVCs seen on tele) Abdomen: no tenderness, no masses palpated. Bowel sounds positive.  Musculoskeletal: no clubbing / cyanosis. Normal muscle tone.  Skin: Positive pitting scars across nose on face Neurologic: CN 2-12 grossly intact. Strength 5/5 in all 4.  Psychiatric: Normal judgment and insight. Alert and oriented x 3. Normal mood.   Labs on Admission: I  have personally reviewed following labs and imaging studies  CBC: Recent Labs  Lab 11/26/20 1447  WBC 5.9  NEUTROABS 3.0  HGB 11.8*  HCT 39.1  MCV 96.8  PLT 123XX123   Basic Metabolic Panel: Recent Labs  Lab 11/26/20 1447  NA 139  K 4.3  CL 108  CO2 21*  GLUCOSE 85  BUN 26*  CREATININE 1.71*  CALCIUM 8.9   GFR: Estimated Creatinine Clearance: 45.9 mL/min (A) (by C-G formula based on SCr of 1.71 mg/dL (H)). Liver Function Tests: Recent Labs  Lab 11/26/20 1447  AST 16  ALT 14  ALKPHOS 54  BILITOT 0.5  PROT 6.6  ALBUMIN 3.3*   No results for input(s): LIPASE, AMYLASE in the last 168 hours. No results  for input(s): AMMONIA in the last 168 hours. Coagulation Profile: Recent Labs  Lab 11/26/20 1447  INR 1.0   Cardiac Enzymes: No results for input(s): CKTOTAL, CKMB, CKMBINDEX, TROPONINI in the last 168 hours. BNP (last 3 results) No results for input(s): PROBNP in the last 8760 hours. HbA1C: No results for input(s): HGBA1C in the last 72 hours. CBG: Recent Labs  Lab 11/26/20 1434  GLUCAP 102*   Lipid Profile: No results for input(s): CHOL, HDL, LDLCALC, TRIG, CHOLHDL, LDLDIRECT in the last 72 hours. Thyroid Function Tests: No results for input(s): TSH, T4TOTAL, FREET4, T3FREE, THYROIDAB in the last 72 hours. Anemia Panel: No results for input(s): VITAMINB12, FOLATE, FERRITIN, TIBC, IRON, RETICCTPCT in the last 72 hours. Urine analysis:    Component Value Date/Time   COLORURINE AMBER (A) 11/26/2020 1459   APPEARANCEUR CLOUDY (A) 11/26/2020 1459   LABSPEC 1.017 11/26/2020 1459   PHURINE 5.0 11/26/2020 1459   GLUCOSEU >=500 (A) 11/26/2020 1459   HGBUR SMALL (A) 11/26/2020 1459   BILIRUBINUR NEGATIVE 11/26/2020 1459   KETONESUR NEGATIVE 11/26/2020 1459   PROTEINUR 30 (A) 11/26/2020 1459   NITRITE POSITIVE (A) 11/26/2020 1459   LEUKOCYTESUR LARGE (A) 11/26/2020 1459     Radiological Exams on Admission: CT Head Wo Contrast  Result Date:  11/26/2020 CLINICAL DATA:  Syncope EXAM: CT HEAD WITHOUT CONTRAST TECHNIQUE: Contiguous axial images were obtained from the base of the skull through the vertex without intravenous contrast. COMPARISON:  None. FINDINGS: Brain: No evidence of acute infarction, hemorrhage, hydrocephalus, extra-axial collection or mass lesion/mass effect. Vascular: Negative for hyperdense vessel Skull: Negative Sinuses/Orbits: Paranasal sinuses clear.  Negative orbit Other: None IMPRESSION: Negative CT head Electronically Signed   By: Marlan Palau M.D.   On: 11/26/2020 14:50   DG Chest Port 1 View  Result Date: 11/26/2020 CLINICAL DATA:  Questionable sepsis - evaluate for abnormality EXAM: PORTABLE CHEST 1 VIEW COMPARISON:  None. FINDINGS: No pneumothorax or pleural effusion. Minimal bibasilar opacities. Diffuse interstitial prominence likely secondary to hypoinflation. Mild cardiomegaly. No acute osseous abnormality. IMPRESSION: Minimal basilar opacities, likely atelectasis versus scarring. Mild cardiomegaly. Electronically Signed   By: Stana Bunting M.D.   On: 11/26/2020 14:10    EKG: Independently reviewed. PVCs on tele with R on T  Assessment/Plan Active Problems:   Syncope and collapse    Syncopal event -Concerning as patient denies any prodrome and reports this is the second episode in the last year (patient appears to be down playing symptoms) -Cardiology consult appreciated -Continue telemetry with close monitoring of rhythm -We will defer echo to cardiology as he had one in September -Even though patient lost control of bladder there was not any report of seizure-like activity so seizure less likely -Magnesium pending -Troponin and BNP unremarkable thus far  Hypotension -Resume home meds in the a.m. with holding parameters -Orthostatic vital signs negative  UTI -Patient denies symptoms -We will check urine culture -Hold Jardiance for now -Given IV Rocephin x1 (will need to reorder if  culture positive)  Questionable acute kidney injury versus chronic kidney disease -Baseline creatinine 12 months ago was 1 -Current creatinine is 1.7 but creatinine 3 months ago was 1.3-1.5 Use IV fluids as orthostatics were not positive -We will hold diuretics for today and consider resumption in the morning pending cardiology recommendations   DVT prophylaxis: scd Code Status: full  Disposition Plan: home in 24-48 hours Consults called: cards (2nd syncopal episode with NO prodrome)   Admission status: obs  At the point of  initial evaluation, it is my clinical opinion that admission for OBSERVATION is reasonable and necessary because the patient's presenting complaints in the context of their chronic conditions represent sufficient risk of deterioration or significant morbidity to constitute reasonable grounds for close observation in the hospital setting, but that the patient may be medically stable for discharge from the hospital within 24 to 48 hours.     Joseph Art [facility]   How to contact the [facility] Attending or Consulting provider 7A - 7P or covering provider during after hours 7P -7A, for this patient?  1. Check the care team in [facility] and look for a) attending/consulting [facility] provider listed and b) the [facility] team listed 2. Log into [email or web address] and use [facility]'s universal password to access. If you do not have the password, please contact the hospital operator. 3. Locate the [facility] provider you are looking for under [facility] and page to a number that you can be directly reached. 4. If you still have difficulty reaching the provider, please page the [facility] (Director on Call) for the Hospitalists listed on amion for assistance.  11/26/2020, 5:54 PM

## 2020-11-26 NOTE — ED Triage Notes (Signed)
Pt presents w/ c/o syncope w/o fall this AM while eating at a diner. Per EMS, pt was sitting at diner eating when he synopsized and was lowered to floor by staff.   On arrival, Pt w/o complaint.

## 2020-11-26 NOTE — ED Provider Notes (Signed)
[facility] EMERGENCY DEPARTMENT Provider Note   CSN: [id number] Arrival date & time: 11/26/20  1254     History Chief Complaint  Patient presents with  . Near Syncope    Kevin Murillo is a 74 y.o. male who presents via EMS after syncopal episode at restaurant this morning.  Patient states he woke up feeling totally normal, went about his normal morning routine and went to eat out for breakfast at a restaurant with a friend, where he goes each morning. Per EMS he syncopized while sitting in his seat, fell forward onto the table, and was lowered to the ground by restaurant staff.  He states that he has no recollection of the syncopal episode, however does recall waking up lying flat on the floor with the EMS standing above him.   Patient states that he has been feeling well since he woke up.  He states that he started a new blood pressure medication approximately 2 months ago, however has not had an issue up to this point.  He denies any chest pain, shortness of breath, dizziness, lightheadedness, confusion, fevers or chills at home, abdominal pain, hematuria. Denies urinary frequency or urgency.  Patient follows closely with his cardiologist for history of systolic heart failure, Dr. Aundra Dubin.  I have personally reviewed the patient's medical records, he has history of hyperlipidemia, diabetes mellitus, hypertension, systolic heart failure.  Patient with very similar episode eating breakfast at the same location 1 year ago; syncopized at that time but was found to have totally normal vital signs by EMS.  Patient opted not to seek medical attention at that time.  Denies recent travel, immobilization, surgery, prior clot, hx of malignancy.   History provided by the patient, states he is feeling totally normal and well at this time. Patient appears very well at this time.   HPI     Past Medical History:  Diagnosis Date  . Chronic systolic CHF (congestive heart  failure), NYHA class 1 ([place])   . History of echocardiogram    Echo 3/16: Mild LVH, EF 40-45%, anteroseptal akinesis, grade 1 diastolic dysfunction, moderate LAE // Echo 3/19: diff HK worse in basal and mid inf wall, mild LVH, EF 25-30, mild MR, mild LAE  . Hyperlipidemia   . Hypertension   . Neuropathy of hand   . Non-ischemic cardiomyopathy ([place])    a. Echo 6/11: 30-35% // b. LHC 7/11: EF 30-35% with diffuse hypokinesis, no angiographic CAD. SPEP and Fe studies unremarkable, TSH normal.//  c. Echo 1/12: EF 45% // d. Echo 6/14: EF 20-25%  //  e. Echo EF 25-30%, diffuse hypokinesis, prominent apical trabeculation // f. CPX 3/15: normal fxnal capacity  // g. CPX 3/16 low norma fxnal capacity  // h. Echo 3/16: mild LVH, EF 40-45%   . Obesity   . PVC (premature ventricular contraction)   . Rosacea   . Tubular adenoma of colon 12/2010    Patient Active Problem List   Diagnosis Date Noted  . Syncope and collapse 11/26/2020  . Osteoarthritis of right hip 11/27/2019  . Primary osteoarthritis of right hip 11/27/2019  . Dilated cardiomyopathy ([place]) 05/07/2013  . Personal history of colonic adenomas 11/04/2012  . HYPERLIPIDEMIA-MIXED 06/16/2010  . Essential hypertension 05/27/2010  . LEFT HEART FAILURE 05/27/2010    Past Surgical History:  Procedure Laterality Date  . ARM SURGERY  1967   RIGHT   . COLONOSCOPY    . COLONOSCOPY WITH PROPOFOL N/A 08/28/2019   Procedure: COLONOSCOPY  WITH PROPOFOL;  Surgeon: Iva Boop, MD;  Location: [facility];  Service: Endoscopy;  Laterality: N/A;  . CYST EXCISION Left 07/29/2019   Procedure: EXCISION OF CHRONIC LEFT LOWER BACK CYST;  Surgeon: Abigail Miyamoto, MD;  Location: [facility] OR;  Service: General;  Laterality: Left;  . KNEE ARTHROSCOPY Left   . RIGHT/LEFT HEART CATH AND CORONARY ANGIOGRAPHY N/A 03/09/2018   Procedure: RIGHT/LEFT HEART CATH AND CORONARY ANGIOGRAPHY;  Surgeon: Laurey Morale, MD;  Location: [facility];  Service:  Cardiovascular;  Laterality: N/A;  . TOTAL HIP ARTHROPLASTY Right 11/27/2019   Procedure: TOTAL HIP ARTHROPLASTY ANTERIOR APPROACH;  Surgeon: Samson Frederic, MD;  Location: [facility];  Service: Orthopedics;  Laterality: Right;       Family History  Problem Relation Age of Onset  . CVA Mother   . Heart attack Other   . Gout Son   . Colon cancer Neg Hx     Social History   Tobacco Use  . Smoking status: Former Smoker    Types: Cigarettes    Quit date: 03/12/2002    Years since quitting: 18.7  . Smokeless tobacco: Never Used  Vaping Use  . Vaping Use: Never used  Substance Use Topics  . Alcohol use: Yes    Alcohol/week: 2.0 standard drinks    Types: 1 Cans of beer, 1 Standard drinks or equivalent per week    Comment: Socially  . Drug use: No    Home Medications Prior to Admission medications   Medication Sig Start Date End Date Taking? Authorizing Provider  carvedilol (COREG) 25 MG tablet TAKE 1 TABLET BY MOUTH TWICE A DAY 07/16/20   Laurey Morale, MD  celecoxib (CELEBREX) 200 MG capsule Take 1 capsule (200 mg total) by mouth daily. 11/28/19   Swinteck, Arlys John, MD  empagliflozin (JARDIANCE) 10 MG TABS tablet Take 1 tablet (10 mg total) by mouth daily before breakfast. 08/18/20   Laurey Morale, MD  ENTRESTO 97-103 MG TAKE 1 TABLET BY MOUTH TWICE A DAY 07/20/20   Laurey Morale, MD  isosorbide-hydrALAZINE (BIDIL) 20-37.5 MG tablet Take 2 tablets by mouth 3 (three) times daily.    [provider]  Multiple Vitamin (MULTIVITAMIN WITH MINERALS) TABS tablet Take 1 tablet by mouth daily.    [provider]  simvastatin (ZOCOR) 40 MG tablet Take 40 mg by mouth daily.  05/16/16   [provider]  spironolactone (ALDACTONE) 25 MG tablet TAKE 1 TABLET BY MOUTH EVERY DAY 05/25/20   Laurey Morale, MD    Allergies    Patient has no known allergies.  Review of Systems   Review of Systems  Constitutional: Negative for activity change, appetite change,  chills, diaphoresis, fatigue and fever.  HENT: Negative.   Eyes: Negative.   Respiratory: Negative for cough, chest tightness, shortness of breath and wheezing.   Cardiovascular: Negative.  Negative for chest pain, palpitations and leg swelling.  Gastrointestinal: Negative.   Endocrine: Negative.   Genitourinary: Negative.   Musculoskeletal: Negative.   Skin: Negative.   Allergic/Immunologic: Positive for immunocompromised state.       DM  Neurological: Positive for syncope. Negative for dizziness, tremors, seizures, facial asymmetry, speech difficulty, weakness, light-headedness, numbness and headaches.  Hematological: Negative.   Psychiatric/Behavioral: Negative for confusion.    Physical Exam Updated Vital Signs BP 96/66   Pulse 68   Temp (!) 97.4 F (36.3 C) (Oral)   Resp 14   Ht 5\' 9"  (1.753 m)  Wt 111.1 kg   SpO2 96%   BMI 36.18 kg/m   Physical Exam Vitals and nursing note reviewed.  Constitutional:      Appearance: Normal appearance. He is well-groomed. He is obese. He is not ill-appearing.  HENT:     Head: Normocephalic and atraumatic.     Nose: Nose normal.     Mouth/Throat:     Mouth: Mucous membranes are moist.     Pharynx: Oropharynx is clear. Uvula midline. No oropharyngeal exudate, posterior oropharyngeal erythema or uvula swelling.  Eyes:     General: No allergic shiner, visual field deficit or scleral icterus.       Right eye: No discharge.        Left eye: No discharge.     Extraocular Movements: Extraocular movements intact.     Conjunctiva/sclera: Conjunctivae normal.     Pupils: Pupils are equal, round, and reactive to light.  Neck:     Trachea: Trachea and phonation normal.  Cardiovascular:     Rate and Rhythm: Normal rate. Rhythm irregular.     Pulses: Normal pulses.          Radial pulses are 2+ on the right side and 2+ on the left side.     Heart sounds: Heart sounds are distant. Murmur heard.   Systolic murmur is present.   Pulmonary:      Effort: Pulmonary effort is normal. No respiratory distress.     Breath sounds: Examination of the right-middle field reveals decreased breath sounds. Examination of the right-lower field reveals decreased breath sounds. Examination of the left-lower field reveals decreased breath sounds. Decreased breath sounds present. No wheezing or rales.  Chest:     Chest wall: No deformity, swelling, tenderness, crepitus or edema.  Abdominal:     General: Bowel sounds are normal. There is no distension.     Palpations: Abdomen is soft.     Tenderness: There is no abdominal tenderness. There is no right CVA tenderness or left CVA tenderness.     Hernia: A hernia is present. Hernia is present in the umbilical area.     Comments: Small umbilical hernia, reducible.   Musculoskeletal:        General: No deformity.     Right shoulder: Normal.     Left shoulder: Normal.     Right upper arm: Normal.     Left upper arm: Normal.     Right elbow: Normal.     Left elbow: Normal.     Right forearm: Normal.     Left forearm: Normal.     Right wrist: Normal.     Left wrist: Normal.     Right hand: No swelling, deformity or lacerations.     Left hand: Normal.       Arms:     Cervical back: Neck supple. No rigidity, tenderness or crepitus. No pain with movement, spinous process tenderness or muscular tenderness.     Right lower leg: No edema.     Comments: 5/5 strength in plantar dorsiflexion bilaterally, 5/5 grip strength bilaterally. 5/5 strength in hip flexion bilaterally.    Lymphadenopathy:     Cervical: No cervical adenopathy.  Skin:    General: Skin is warm and dry.     Capillary Refill: Capillary refill takes less than 2 seconds.  Neurological:     General: No focal deficit present.     Mental Status: He is alert and oriented to person, place, and time. Mental status is at baseline.  Cranial Nerves: Cranial nerves are intact.     Sensory: Sensation is intact.     Motor: Motor function is  intact.     Coordination: Coordination is intact.  Psychiatric:        Attention and Perception: Attention normal.        Mood and Affect: Mood and affect normal.        Speech: Speech normal.        Behavior: Behavior normal.     ED Results / Procedures / Treatments   Labs (all labs ordered are listed, but only abnormal results are displayed) Labs Reviewed  COMPREHENSIVE METABOLIC PANEL - Abnormal; Notable for the following components:      Result Value   CO2 21 (*)    BUN 26 (*)    Creatinine, Ser 1.71 (*)    Albumin 3.3 (*)    GFR, Estimated 41 (*)    All other components within normal limits  CBC WITH DIFFERENTIAL/PLATELET - Abnormal; Notable for the following components:   RBC 4.04 (*)    Hemoglobin 11.8 (*)    All other components within normal limits  URINALYSIS, ROUTINE W REFLEX MICROSCOPIC - Abnormal; Notable for the following components:   Color, Urine AMBER (*)    APPearance CLOUDY (*)    Glucose, UA >=500 (*)    Hgb urine dipstick SMALL (*)    Protein, ur 30 (*)    Nitrite POSITIVE (*)    Leukocytes,Ua LARGE (*)    WBC, UA >50 (*)    Bacteria, UA MANY (*)    All other components within normal limits  BRAIN NATRIURETIC PEPTIDE - Abnormal; Notable for the following components:   B Natriuretic Peptide 128.0 (*)    All other components within normal limits  CBG MONITORING, ED - Abnormal; Notable for the following components:   Glucose-Capillary 102 (*)    All other components within normal limits  TROPONIN I (HIGH SENSITIVITY) - Abnormal; Notable for the following components:   Troponin I (High Sensitivity) 21 (*)    All other components within normal limits  RESP PANEL BY RT-PCR (FLU A&B, COVID) ARPGX2  URINE CULTURE  LACTIC ACID, PLASMA  LACTIC ACID, PLASMA  PROTIME-INR  APTT  RAPID URINE DRUG SCREEN, HOSP PERFORMED  MAGNESIUM  BASIC METABOLIC PANEL  CBC  TROPONIN I (HIGH SENSITIVITY)    EKG EKG Interpretation  Date/Time:  Thursday November 26 2020 13:06:59 EST Ventricular Rate:  69 PR Interval:    QRS Duration: 134 QT Interval:  423 QTC Calculation: 454 R Axis:   -62 Text Interpretation: Sinus rhythm new Ventricular premature complex Prolonged PR interval Nonspecific IVCD with LAD Consider anterolateral infarct Borderline T abnormalities, inferior leads Otherwise no significant change Confirmed by Blanchie Dessert [phone]) on 11/26/2020 1:34:35 PM   Radiology CT Head Wo Contrast  Result Date: 11/26/2020 CLINICAL DATA:  Syncope EXAM: CT HEAD WITHOUT CONTRAST TECHNIQUE: Contiguous axial images were obtained from the base of the skull through the vertex without intravenous contrast. COMPARISON:  None. FINDINGS: Brain: No evidence of acute infarction, hemorrhage, hydrocephalus, extra-axial collection or mass lesion/mass effect. Vascular: Negative for hyperdense vessel Skull: Negative Sinuses/Orbits: Paranasal sinuses clear.  Negative orbit Other: None IMPRESSION: Negative CT head Electronically Signed   By: Franchot Gallo M.D.   On: 11/26/2020 14:50   DG Chest Port 1 View  Result Date: 11/26/2020 CLINICAL DATA:  Questionable sepsis - evaluate for abnormality EXAM: PORTABLE CHEST 1 VIEW COMPARISON:  None. FINDINGS: No pneumothorax or  pleural effusion. Minimal bibasilar opacities. Diffuse interstitial prominence likely secondary to hypoinflation. Mild cardiomegaly. No acute osseous abnormality. IMPRESSION: Minimal basilar opacities, likely atelectasis versus scarring. Mild cardiomegaly. Electronically Signed   By: Primitivo Gauze M.D.   On: 11/26/2020 14:10    Procedures Procedures (including critical care time)  Medications Ordered in ED Medications  sodium chloride flush (NS) 0.9 % injection 3 mL (has no administration in time range)  acetaminophen (TYLENOL) tablet 650 mg (has no administration in time range)    Or  acetaminophen (TYLENOL) suppository 650 mg (has no administration in time range)  ondansetron (ZOFRAN) tablet  4 mg (has no administration in time range)    Or  ondansetron (ZOFRAN) injection 4 mg (has no administration in time range)  sacubitril-valsartan (ENTRESTO) 97-103 mg per tablet (has no administration in time range)  carvedilol (COREG) tablet 25 mg (has no administration in time range)  sodium chloride 0.9 % bolus 500 mL (500 mLs Intravenous New Bag/Given 11/26/20 1613)  cefTRIAXone (ROCEPHIN) 2 g in sodium chloride 0.9 % 100 mL IVPB (0 g Intravenous Stopped 11/26/20 1613)    ED Course  I have reviewed the triage vital signs and the nursing notes.  Pertinent labs & imaging results that were available during my care of the patient were reviewed by me and considered in my medical decision making (see chart for details).    MDM Rules/Calculators/A&P                         74 year old male who presents for concern of syncopal episode this morning without prodromal symptoms.  Patient asymptomatic at this time.  The differential for syncope is extensive and includes, but is not limited to: arrythmia (Vtach, SVT, SSS, sinus arrest, AV block, bradycardia), aortic stenosis, AMI, hypertrophic obstructive cardiomyopathy (HOCM), PE, atrial myxoma, pulmonary hypertension, orthostatic hypotension, hypovolemia, drug effect, GB syndrome, micturition, cough, carotid sinus sensitivity, seizure, TIA/CVA, hypoglycemia, vertigo.  Concern for arrhythmia, given lack of prodromal symptoms.   Hypotensive on intake to 95/59.  Vital signs otherwise normal.  Cardiac exam with irregular heart rhythm, PVCs frequent on cardiac monitoring.  Breath sounds diminished in the lung bases bilaterally.  Abdomen soft, nondistended and nontender.  Systolic BP now in mid [id number].   CBC with mild anemia hemoglobin 11.8, previously 13.4.  CMP with mild elevation in BUN/creatinine to 26 /1.71, previously 27/1.46.  Urine concerning for small hemoglobin, positive for nitrates, large leukocytes, many bacteria -consistent with UTI.   Will proceed with antibiotic for UTI, 500 cc fluid bolus. Caution with fluid administration in pt with baseline LVEF of 30%.   Troponin mildly to 21. Delta trop pending.  BNP mildly elevated to 128.  Mag pending.   Lactic acid normal, 1.2.  EKG with New PVCs, borderline T abnormalities in the inferior leads.  Chest x-ray with minimal basilar opacities likely atelectasis versus scarring.  Mild cardiomegaly.   CT negative for acute abnormality.  UDS negative.  Consult placed to hospitalist, Dr. Eliseo Squires, who is agreeable to seeing this patient admitting him to her service. Will consult cardiology, at the request of the hospitalist. I appreciate her collaboration in the care of this patient.   Consult placed to cardiology, Rosaria Ferries, PA-C, who is agreeable to seeing this patient during his admission. I appreciate her collaboration in the care of this patient.   Elisio voiced understanding of his medical evaluation and treatment plan.  Each of his questions were answered to his  expressed satisfaction.  Patient is amenable to admission at this time.  Will continue to monitor this patient throughout his stay in our department.     This chart was dictated using voice recognition software, Dragon. Despite the best efforts of this provider to proofread and correct errors, errors may still occur which can change documentation meaning.  Final Clinical Impression(s) / ED Diagnoses Final diagnoses:  None    Rx / DC Orders ED Discharge Orders    None       Aura Dials 11/26/20 1804    Luna Fuse, MD 11/26/20 1836

## 2020-11-27 ENCOUNTER — Other Ambulatory Visit: Payer: Self-pay | Admitting: Physician Assistant

## 2020-11-27 DIAGNOSIS — R55 Syncope and collapse: Secondary | ICD-10-CM

## 2020-11-27 DIAGNOSIS — I428 Other cardiomyopathies: Secondary | ICD-10-CM | POA: Diagnosis not present

## 2020-11-27 LAB — BASIC METABOLIC PANEL
Anion gap: 8 (ref 5–15)
BUN: 25 mg/dL — ABNORMAL HIGH (ref 8–23)
CO2: 23 mmol/L (ref 22–32)
Calcium: 8.7 mg/dL — ABNORMAL LOW (ref 8.9–10.3)
Chloride: 107 mmol/L (ref 98–111)
Creatinine, Ser: 1.37 mg/dL — ABNORMAL HIGH (ref 0.61–1.24)
GFR, Estimated: 54 mL/min — ABNORMAL LOW (ref >60)
Glucose, Bld: 108 mg/dL — ABNORMAL HIGH (ref 70–99)
Potassium: 4 mmol/L (ref 3.5–5.1)
Sodium: 138 mmol/L (ref 135–145)

## 2020-11-27 LAB — CBC
HCT: 38.7 % — ABNORMAL LOW (ref 39.0–52.0)
Hemoglobin: 12.2 g/dL — ABNORMAL LOW (ref 13.0–17.0)
MCH: 29.7 pg (ref 26.0–34.0)
MCHC: 31.5 g/dL (ref 30.0–36.0)
MCV: 94.2 fL (ref 80.0–100.0)
Platelets: 219 10*3/uL (ref 150–400)
RBC: 4.11 MIL/uL — ABNORMAL LOW (ref 4.22–5.81)
RDW: 14.6 % (ref 11.5–15.5)
WBC: 6.1 10*3/uL (ref 4.0–10.5)
nRBC: 0 % (ref 0.0–0.2)

## 2020-11-27 MED ORDER — FOSFOMYCIN TROMETHAMINE 3 G PO PACK
3.0000 g | PACK | Freq: Once | ORAL | Status: AC
  Administered 2020-11-27: 3 g via ORAL
  Filled 2020-11-27: qty 3

## 2020-11-27 NOTE — Progress Notes (Signed)
Progress Note  Patient Name: Kevin Murillo Date of Encounter: 11/27/2020  [facility] Cardiologist: Nelva Bush, MD  Heart failure: Loralie Champagne MD  Subjective   Feels well today, no dizziness. No chest pain,dyspnea. No fever or dysuria.   Inpatient Medications    Scheduled Meds: . carvedilol  25 mg Oral BID  . sacubitril-valsartan  1 tablet Oral BID  . sodium chloride flush  3 mL Intravenous Q12H   Continuous Infusions:  PRN Meds: acetaminophen **OR** acetaminophen, ondansetron **OR** ondansetron (ZOFRAN) IV   Vital Signs    Vitals:   11/27/20 0100 11/27/20 0200 11/27/20 0509 11/27/20 0510  BP:    104/79  Pulse:    62  Resp: 12 14  18   Temp:    98.3 F (36.8 C)  TempSrc:    Oral  SpO2:    99%  Weight:   110.8 kg   Height:        Intake/Output Summary (Last 24 hours) at 11/27/2020 0721 Last data filed at 11/27/2020 0500 Gross per 24 hour  Intake 240 ml  Output 1275 ml  Net -1035 ml   Last 3 Weights 11/27/2020 11/26/2020 11/26/2020  Weight (lbs) 244 lb 3.2 oz 248 lb 11.2 oz 245 lb  Weight (kg) 110.768 kg 112.81 kg 111.131 kg      Telemetry    NSR with PVCs.  - Personally Reviewed  ECG    NSR. Normal QTc. No acute change.  - Personally Reviewed  Physical Exam   GEN: No acute distress.   Neck: No JVD Cardiac: RRR, no murmurs, rubs, or gallops.  Respiratory: Clear to auscultation bilaterally. GI: Soft, nontender, non-distended  MS: No edema; No deformity. Neuro:  Nonfocal  Psych: Normal affect   Labs    High Sensitivity Troponin:   Recent Labs  Lab 11/26/20 1521 11/26/20 1721  TROPONINIHS 21* 19*      Chemistry Recent Labs  Lab 11/26/20 1447 11/27/20 0305  NA 139 138  K 4.3 4.0  CL 108 107  CO2 21* 23  GLUCOSE 85 108*  BUN 26* 25*  CREATININE 1.71* 1.37*  CALCIUM 8.9 8.7*  PROT 6.6  --   ALBUMIN 3.3*  --   AST 16  --   ALT 14  --   ALKPHOS 54  --   BILITOT 0.5  --   GFRNONAA 41* 54*  ANIONGAP 10 8      Hematology Recent Labs  Lab 11/26/20 1447 11/27/20 0305  WBC 5.9 6.1  RBC 4.04* 4.11*  HGB 11.8* 12.2*  HCT 39.1 38.7*  MCV 96.8 94.2  MCH 29.2 29.7  MCHC 30.2 31.5  RDW 14.4 14.6  PLT 199 219    BNP Recent Labs  Lab 11/26/20 1526  BNP 128.0*     DDimer No results for input(s): DDIMER in the last 168 hours.   Radiology    CT Head Wo Contrast  Result Date: 11/26/2020 CLINICAL DATA:  Syncope EXAM: CT HEAD WITHOUT CONTRAST TECHNIQUE: Contiguous axial images were obtained from the base of the skull through the vertex without intravenous contrast. COMPARISON:  None. FINDINGS: Brain: No evidence of acute infarction, hemorrhage, hydrocephalus, extra-axial collection or mass lesion/mass effect. Vascular: Negative for hyperdense vessel Skull: Negative Sinuses/Orbits: Paranasal sinuses clear.  Negative orbit Other: None IMPRESSION: Negative CT head Electronically Signed   By: Franchot Gallo M.D.   On: 11/26/2020 14:50   DG Chest Port 1 View  Result Date: 11/26/2020 CLINICAL DATA:  Questionable sepsis -  evaluate for abnormality EXAM: PORTABLE CHEST 1 VIEW COMPARISON:  None. FINDINGS: No pneumothorax or pleural effusion. Minimal bibasilar opacities. Diffuse interstitial prominence likely secondary to hypoinflation. Mild cardiomegaly. No acute osseous abnormality. IMPRESSION: Minimal basilar opacities, likely atelectasis versus scarring. Mild cardiomegaly. Electronically Signed   By: Stana Bunting M.D.   On: 11/26/2020 14:10    Cardiac Studies   Echo 08/18/20: IMPRESSIONS    1. Left ventricular ejection fraction, by estimation, is 30%. The left  ventricle has moderate to severely decreased function. The left ventricle  demonstrates global hypokinesis. The left ventricular internal cavity size  was mildly dilated. Left  ventricular diastolic parameters are consistent with Grade I diastolic  dysfunction (impaired relaxation).  2. Right ventricular systolic function is  normal. The right ventricular  size is normal. Tricuspid regurgitation signal is inadequate for assessing  PA pressure.  3. The mitral valve is normal in structure. No evidence of mitral valve  regurgitation. No evidence of mitral stenosis.  4. The aortic valve is tricuspid. Aortic valve regurgitation is not  visualized. No aortic stenosis is present.  5. The inferior vena cava is normal in size with greater than 50%  respiratory variability, suggesting right atrial pressure of 3 mmHg.   Patient Profile     74 y.o. male with a hx of nonischemic cardiomyopathy who is being seen today for the evaluation of syncope at the request of Dr. Benjamine Mola.  Assessment & Plan    1. Syncope.  - suspect related to hypotension in setting of UTI and dehydration. Creatinine elevated- now improved. BP better this am. Patient does have PVCs but no NSVT or pauses noted on telemetry.  - labs remarkable for elevated creatinine and dirty urine.  - troponin levels negative for ACS. - recommend resuming Coreg and Entresto today.  - continue to hold Bidil, aldactone, and Jardiance. - ambulate in halls today.  - if stable could be DC from our standpoint - would recommend event monitor as outpatient. - needs close follow up in Heart failure clinic  2. NICM. Euvolemic. BNP low. CXR without fluid.  - medication changes as noted above.       For questions or updates, please contact [facility] Please consult [email or web address] for contact info under        Signed, Hydie Langan [place], MD  11/27/2020, 7:21 AM   '

## 2020-11-27 NOTE — Progress Notes (Signed)
D/C instructions given and reviewed. Questions asked and answered. Tele and IV's removed, tolerated well. 

## 2020-11-27 NOTE — Progress Notes (Signed)
Staff message sent to our [address] staff to arrange a 14 day monitor. Monitor will be mailed to the patient after discharge.

## 2020-11-27 NOTE — Discharge Summary (Signed)
Physician Discharge Summary  Kevin Murillo [id number] DOB: 1946-03-25 DOA: 11/26/2020  PCP: Shon Baton, MD  Admit date: 11/26/2020 Discharge date: 11/27/2020  Admitted From: home Discharge disposition: home   Recommendations for Outpatient Follow-Up:   Event monitor per cards   Discharge Diagnosis:   Active Problems:   Syncope and collapse    Discharge Condition: Improved.  Diet recommendation: Low sodium, heart healthy.   Wound care: None.  Code status: Full.   History of Present Illness:   Kevin Murillo is a 74 y.o. male with medical history significant of chronic systolic heart failure, HTN, HLD.    Patient was brought in by EMS after syncopal rising this a.m. after eating breakfast.  Patient reports that he had finished eating breakfast and was visiting with a friend when the friend stated he fell forward onto the table and the friend called the waitress who lowered him to the floor.  Reported seizure activity but patient did have loss of bladder control.  It is unclear how long patient was unconscious for.  Patient only remembers speaking with a friend and then waking up when EMS arrived.  Patient does not have this friend's phone number to verify accounts of the event.  Patient states he had a similar episode last year but when EMS came "his numbers were okay" so he went home. Patient denies any recent illness.  He follows with Dr. Aundra Dubin.  Only new medication is Jardiance.  In the ER he was noticed to have increasing amounts of PVCs with R on T.  He was also thought to have a suspected urinary tract infection.  Patient currently denies symptoms of dysuria and increasing frequency.  As were done and were negative.  As there is concern for cardiogenic syncope x2, cardiology consult was obtained.  Magnesium levels pending.  Observation telemetry orders placed.    Hospital Course by Problem:    Syncope.  - suspect related to hypotension  in setting of UTI and dehydration. Creatinine elevated- now improved. BP better this am. Patient does have PVCs but no NSVT or pauses noted on telemetry.  - troponin levels negative for ACS. - continue to hold Bidil, aldactone, and Jardiance. - would recommend event monitor as outpatient. - needs close follow up in Heart failure clinic   NICM. Euvolemic. BNP low. CXR without fluid.  - hold aldactone  ? UTI- ESBL -treated with fosfomycin -- not clear that he was entire symptomatic but could have contributed to syncopal episode  AKI  -improved with holding meds -close outpatient follow up  Medical Consultants:   cards   Discharge Exam:   Vitals:   11/27/20 0510 11/27/20 0756  BP: 104/79 112/82  Pulse: 62 95  Resp: 18 16  Temp: 98.3 F (36.8 C) 98.7 F (37.1 C)  SpO2: 99% 99%   Vitals:   11/27/20 0200 11/27/20 0509 11/27/20 0510 11/27/20 0756  BP:   104/79 112/82  Pulse:   62 95  Resp: 14  18 16   Temp:   98.3 F (36.8 C) 98.7 F (37.1 C)  TempSrc:   Oral Oral  SpO2:   99% 99%  Weight:  110.8 kg    Height:        General exam: Appears calm and comfortable.   The results of significant diagnostics from this hospitalization (including imaging, microbiology, ancillary and laboratory) are listed below for reference.     Procedures and Diagnostic Studies:   CT Head Wo Contrast  Result Date: 11/26/2020 CLINICAL DATA:  Syncope EXAM: CT HEAD WITHOUT CONTRAST TECHNIQUE: Contiguous axial images were obtained from the base of the skull through the vertex without intravenous contrast. COMPARISON:  None. FINDINGS: Brain: No evidence of acute infarction, hemorrhage, hydrocephalus, extra-axial collection or mass lesion/mass effect. Vascular: Negative for hyperdense vessel Skull: Negative Sinuses/Orbits: Paranasal sinuses clear.  Negative orbit Other: None IMPRESSION: Negative CT head Electronically Signed   By: Franchot Gallo M.D.   On: 11/26/2020 14:50   DG Chest Port 1  View  Result Date: 11/26/2020 CLINICAL DATA:  Questionable sepsis - evaluate for abnormality EXAM: PORTABLE CHEST 1 VIEW COMPARISON:  None. FINDINGS: No pneumothorax or pleural effusion. Minimal bibasilar opacities. Diffuse interstitial prominence likely secondary to hypoinflation. Mild cardiomegaly. No acute osseous abnormality. IMPRESSION: Minimal basilar opacities, likely atelectasis versus scarring. Mild cardiomegaly. Electronically Signed   By: Primitivo Gauze M.D.   On: 11/26/2020 14:10     Labs:   Basic Metabolic Panel: Recent Labs  Lab 11/26/20 1447 11/26/20 1737 11/27/20 0305  NA 139  --  138  K 4.3  --  4.0  CL 108  --  107  CO2 21*  --  23  GLUCOSE 85  --  108*  BUN 26*  --  25*  CREATININE 1.71*  --  1.37*  CALCIUM 8.9  --  8.7*  MG  --  2.1  --    GFR Estimated Creatinine Clearance: 57.1 mL/min (A) (by C-G formula based on SCr of 1.37 mg/dL (H)). Liver Function Tests: Recent Labs  Lab 11/26/20 1447  AST 16  ALT 14  ALKPHOS 54  BILITOT 0.5  PROT 6.6  ALBUMIN 3.3*   No results for input(s): LIPASE, AMYLASE in the last 168 hours. No results for input(s): AMMONIA in the last 168 hours. Coagulation profile Recent Labs  Lab 11/26/20 1447  INR 1.0    CBC: Recent Labs  Lab 11/26/20 1447 11/27/20 0305  WBC 5.9 6.1  NEUTROABS 3.0  --   HGB 11.8* 12.2*  HCT 39.1 38.7*  MCV 96.8 94.2  PLT 199 219   Cardiac Enzymes: No results for input(s): CKTOTAL, CKMB, CKMBINDEX, TROPONINI in the last 168 hours. BNP: Invalid input(s): POCBNP CBG: Recent Labs  Lab 11/26/20 1434  GLUCAP 102*   D-Dimer No results for input(s): DDIMER in the last 72 hours. Hgb A1c No results for input(s): HGBA1C in the last 72 hours. Lipid Profile No results for input(s): CHOL, HDL, LDLCALC, TRIG, CHOLHDL, LDLDIRECT in the last 72 hours. Thyroid function studies No results for input(s): TSH, T4TOTAL, T3FREE, THYROIDAB in the last 72 hours.  Invalid input(s):  FREET3 Anemia work up No results for input(s): VITAMINB12, FOLATE, FERRITIN, TIBC, IRON, RETICCTPCT in the last 72 hours. Microbiology Recent Results (from the past 240 hour(s))  Resp Panel by RT-PCR (Flu A&B, Covid) Nasopharyngeal Swab     Status: None   Collection Time: 11/26/20  1:57 PM   Specimen: Nasopharyngeal Swab; Nasopharyngeal(NP) swabs in vial transport medium  Result Value Ref Range Status   SARS Coronavirus 2 by RT PCR NEGATIVE NEGATIVE Final    Comment: (NOTE) SARS-CoV-2 target nucleic acids are NOT DETECTED.  The SARS-CoV-2 RNA is generally detectable in upper respiratory specimens during the acute phase of infection. The lowest concentration of SARS-CoV-2 viral copies this assay can detect is 138 copies/mL. A negative result does not preclude SARS-Cov-2 infection and should not be used as the sole basis for treatment or other patient management decisions. A  negative result may occur with  improper specimen collection/handling, submission of specimen other than nasopharyngeal swab, presence of viral mutation(s) within the areas targeted by this assay, and inadequate number of viral copies(<138 copies/mL). A negative result must be combined with clinical observations, patient history, and epidemiological information. The expected result is Negative.  Fact Sheet for Patients:  EntrepreneurPulse.com.au  Fact Sheet for Healthcare Providers:  IncredibleEmployment.be  This test is no t yet approved or cleared by the [place] FDA and  has been authorized for detection and/or diagnosis of SARS-CoV-2 by FDA under an Emergency Use Authorization (EUA). This EUA will remain  in effect (meaning this test can be used) for the duration of the COVID-19 declaration under Section 564(b)(1) of the Act, 21 U.S.C.section 360bbb-3(b)(1), unless the authorization is terminated  or revoked sooner.       Influenza A by PCR NEGATIVE NEGATIVE  Final   Influenza B by PCR NEGATIVE NEGATIVE Final    Comment: (NOTE) The Xpert Xpress SARS-CoV-2/FLU/RSV plus assay is intended as an aid in the diagnosis of influenza from Nasopharyngeal swab specimens and should not be used as a sole basis for treatment. Nasal washings and aspirates are unacceptable for Xpert Xpress SARS-CoV-2/FLU/RSV testing.  Fact Sheet for Patients: EntrepreneurPulse.com.au  Fact Sheet for Healthcare Providers: IncredibleEmployment.be  This test is not yet approved or cleared by the [place] FDA and has been authorized for detection and/or diagnosis of SARS-CoV-2 by FDA under an Emergency Use Authorization (EUA). This EUA will remain in effect (meaning this test can be used) for the duration of the COVID-19 declaration under Section 564(b)(1) of the Act, 21 U.S.C. section 360bbb-3(b)(1), unless the authorization is terminated or revoked.  Performed at [facility], [place] [address] [place], [place] [address]      Discharge Instructions:   Discharge Instructions    (HEART FAILURE PATIENTS) Call MD:  Anytime you have any of the following symptoms: 1) 3 pound weight gain in 24 hours or 5 pounds in 1 week 2) shortness of breath, with or without a dry hacking cough 3) swelling in the hands, feet or stomach 4) if you have to sleep on extra pillows at night in order to breathe.   Complete by: As directed    Diet - low sodium heart healthy   Complete by: As directed    Increase activity slowly   Complete by: As directed      Allergies as of 11/27/2020   No Known Allergies     Medication List    STOP taking these medications   empagliflozin 10 MG Tabs tablet Commonly known as: JARDIANCE   isosorbide-hydrALAZINE 20-37.5 MG tablet Commonly known as: BIDIL   spironolactone 25 MG tablet Commonly known as: ALDACTONE     TAKE these medications   aspirin EC 81 MG tablet Take 81 mg by mouth daily. Swallow  whole.   carvedilol 25 MG tablet Commonly known as: COREG TAKE 1 TABLET BY MOUTH TWICE A DAY What changed: when to take this   Entresto 97-103 MG Generic drug: sacubitril-valsartan TAKE 1 TABLET BY MOUTH TWICE A DAY   multivitamin with minerals Tabs tablet Take 1 tablet by mouth daily.   simvastatin 40 MG tablet Commonly known as: ZOCOR Take 40 mg by mouth daily.       Follow-up Information    [facility] Follow up.   Specialty: Cardiology Why: office staff will mail you a heart monitor to wear for 2 weeks  Contact information: [address], [address] [place] [address] [phone]       Creola Corn, MD Follow up in 1 week(s).   Specialty: Internal Medicine Contact information: [address] [place] [address] [phone]        Laurey Morale, MD. Schedule an appointment as soon as possible for a visit.   Specialty: Cardiology Contact information: [address] [place] [address] [phone]                Time coordinating discharge: 25 min  Signed:  Joseph Art DO  [facility] 11/27/2020, 9:40 AM

## 2020-11-27 NOTE — Plan of Care (Signed)

## 2020-11-28 LAB — URINE CULTURE: Culture: >=100000 — AB

## 2020-11-30 ENCOUNTER — Encounter: Payer: Self-pay | Admitting: *Deleted

## 2020-11-30 ENCOUNTER — Ambulatory Visit: Payer: PPO

## 2020-11-30 ENCOUNTER — Other Ambulatory Visit: Payer: Self-pay | Admitting: Physician Assistant

## 2020-11-30 DIAGNOSIS — R55 Syncope and collapse: Secondary | ICD-10-CM

## 2020-11-30 DIAGNOSIS — I493 Ventricular premature depolarization: Secondary | ICD-10-CM

## 2020-11-30 NOTE — Progress Notes (Signed)
Patient ID: Kevin Murillo, male   DOB: 1946-02-25, 75 y.o.   MRN: [id number] Patient enrolled for [employer] to ship a 14 day ZIO AT long term monitor-Live Telemetry to his home.  Letter with instructions mailed to patient.

## 2020-12-02 ENCOUNTER — Telehealth (HOSPITAL_COMMUNITY): Payer: Self-pay | Admitting: Pharmacy Technician

## 2020-12-02 NOTE — Telephone Encounter (Signed)
Sent in [facility] and [employer] via fax.  Will follow up.

## 2020-12-04 ENCOUNTER — Telehealth: Payer: Self-pay | Admitting: Internal Medicine

## 2020-12-04 NOTE — Telephone Encounter (Signed)
Patient is requesting assistance with applying Zio monitor.

## 2020-12-07 ENCOUNTER — Other Ambulatory Visit: Payer: Self-pay

## 2020-12-07 NOTE — Telephone Encounter (Signed)
Patient is following up on his call from Friday 12/04/2020, he states that he needs assistance with applying his Zio monitor. Please advise.

## 2020-12-07 NOTE — Telephone Encounter (Signed)
Patient scheduled to come to [facility] office at 3:30 PM, 12/07/2020, to have ZIO AT monitor applied.

## 2020-12-18 ENCOUNTER — Ambulatory Visit (HOSPITAL_COMMUNITY)
Admission: RE | Admit: 2020-12-18 | Discharge: 2020-12-18 | Disposition: A | Discharge status: Home / Self Care | Payer: PPO | Source: Ambulatory Visit | Attending: Cardiology | Admitting: Cardiology

## 2020-12-18 ENCOUNTER — Encounter (HOSPITAL_COMMUNITY): Payer: Self-pay | Admitting: Cardiology

## 2020-12-18 ENCOUNTER — Other Ambulatory Visit: Payer: Self-pay

## 2020-12-18 VITALS — BP 130/70 | HR 65 | Wt 253.0 lb

## 2020-12-18 DIAGNOSIS — I110 Hypertensive heart disease with heart failure: Secondary | ICD-10-CM | POA: Diagnosis not present

## 2020-12-18 DIAGNOSIS — R55 Syncope and collapse: Secondary | ICD-10-CM

## 2020-12-18 DIAGNOSIS — I5022 Chronic systolic (congestive) heart failure: Secondary | ICD-10-CM

## 2020-12-18 DIAGNOSIS — I428 Other cardiomyopathies: Secondary | ICD-10-CM | POA: Diagnosis not present

## 2020-12-18 DIAGNOSIS — Z87891 Personal history of nicotine dependence: Secondary | ICD-10-CM | POA: Insufficient documentation

## 2020-12-18 DIAGNOSIS — I501 Left ventricular failure: Secondary | ICD-10-CM | POA: Diagnosis not present

## 2020-12-18 DIAGNOSIS — Z7982 Long term (current) use of aspirin: Secondary | ICD-10-CM | POA: Insufficient documentation

## 2020-12-18 DIAGNOSIS — Z8249 Family history of ischemic heart disease and other diseases of the circulatory system: Secondary | ICD-10-CM | POA: Diagnosis not present

## 2020-12-18 DIAGNOSIS — E785 Hyperlipidemia, unspecified: Secondary | ICD-10-CM | POA: Insufficient documentation

## 2020-12-18 MED ORDER — SPIRONOLACTONE 25 MG PO TABS: 12.5000 mg | ORAL_TABLET | Freq: Every day | ORAL | 3 refills | Status: DC

## 2020-12-18 MED ORDER — BIDIL 20-37.5 MG PO TABS: 1.0000 | ORAL_TABLET | Freq: Three times a day (TID) | ORAL | 3 refills | Status: DC

## 2020-12-18 NOTE — Patient Instructions (Addendum)
START Bidil 1 tablet three times Daily  START Spironolactone 12.5mg  (1/2 tablet) Daily  You have been referred to EP. Their office will contact you to schedule an appointment  Your physician recommends that you schedule a follow-up appointment in: 3 weeks with Pharmacy  Your physician recommends that you schedule a follow-up appointment in: 2 months  If you have any questions or concerns before your next appointment please send [place] a message through [facility] or call our office at [phone].    TO LEAVE A MESSAGE FOR THE NURSE SELECT OPTION 2, PLEASE LEAVE A MESSAGE INCLUDING:  YOUR NAME  DATE OF BIRTH  CALL BACK NUMBER  REASON FOR CALL**this is important as we prioritize the call backs  YOU WILL RECEIVE A CALL BACK THE SAME DAY AS LONG AS YOU CALL BEFORE 4:00 PM

## 2020-12-20 NOTE — Progress Notes (Signed)
Patient ID: Kevin Murillo, male   DOB: 09-15-1946, 75 y.o.   MRN: [id number] PCP: Dr. Virgina Jock Cardiology: Dr. Aundra Dubin  75 y.o.with history of HTN and nonischemic cardiomyopathy (diagnosed in 2011) presents for cardiology followup.  He had an echo in 1/15, showing that EF remains 25-30% with mild LV dilation.  I had him do a cardiopulmonary exercise test in 3/15 that showed normal functional capacity.  Most recent echo in 4/19 showed EF remains 25-30%.  Repeat LHC/RHC in 4/19 showed no significant coronary disease, mildly elevated LV filling pressure, and CI 2.01.   Echo in 2/20 showed EF 30-35%, normal RV. Echo in 9/21 showed EF 30% with mild LV dilation, diffuse hypokinesis.   In 12/21, he was sitting in a restaurant eating breakfast when he passed out suddenly with no prodrome. He quickly regained consciousness. He was admitted to the hospital.  He was found to have a UTI, Jardiance was stopped.  His BP was low, ?due to urosepsis/dehydration.  He was also taken off Bidil and spironolactone. No prior dizzy spells and none since.  He is now wearing a Zio patch.  He return for followup of CHF.  Using a walker when he walks long distances due to back and hip pain.  No chest pain.  No significant exertional dyspnea.  No orthopnea/PND.  Weight up 2 lbs.   Labs (6/11): K 3.9, TSH normal, creatinine 1.0  Labs (7/11): HDL 60, LDL 122, K 4.4, creatinine 1.0, BNP 86, SPEP normal, transferrin saturation 24%  Labs (9/11): LDL 67, HDL 48, TGs 145, LFTs normal  Labs (10/11): K 4, creatinine 1.2  Labs (5/13): K 3.9, creatinine 1.0, LDL 66, HDL 44 Labs (7/14): K 3.9, creatinine 1.1 Labs (3/15): K 4.1, creatinine 1.1 Labs (8/15): K 4, creatinine 1.1, BNP 21 Labs (4/19): K 4.4, creatinine 1.06 Labs (5/19): K 4.4, creatinine 1.13 Labs (8/19): K 4.2, creatinine 1.29 Labs (11/19): K 4.1, creatinine 1.09 Labs (2/21): K 4.2, creatinine 1.63 Labs (5/21): K 4.3, creatinine 1.2, LDL 47 Labs (12/21): K 4,  creatinine 1.37  Allergies (verified):  No Known Drug Allergies   Past Medical History:  1. HTN  2. Left hand neuropathy  3. Nonischemic cardiomyopathy: Echo (6/11) with EF 30-35%, posteroinferior severe hypokinesis, moderate diastolic dysfunction, mild left atrial enlargement, mild LV hypertrophy. LHC (7/11): EF 30-35% with diffuse hypokinesis, no angiographic CAD. SPEP and Fe studies unremarkable, TSH normal.  Repeat echo (1/12) with EF 45%, mild global hypokinesis, mild LVH, normal RV size and systolic function.  Echo (6/14) with EF 20-25%, diffuse hypokinesis, mild LV dilation. Echo (1/15) with mild LV dilation, EF 25-30%, diffuse hypokinesis, prominent apical trabeculation, mildly dilated RV. CPX (3/15) with VO2 max 20.1, VE/VCO2 slope 34.7 (near normal when adjusted to respiratory compensation point) => this CPX showed normal functional capacity for his age.  - Echo (4/19): EF 25-30%, mild MR.  - RHC/LHC (4/19): No significant CAD.  Mean RA 6, PA 46/18 mean 32, mean PCWP 21, CI 2.01, PVR 2.4 WU (pulmonary venous hypertension).  - Echo (2/20): EF 30-35%, moderate diastolic dysfunction, normal RV size and systolic function, moderate LAE.  - Echo (9/21): EF 30%, mild LV dilation, diffuse hypokinesis, normal RV.  4. Hyperlipidemia  5. Syncope (12/21): No prodrome.   Family History:  Mother died with CHF at 58.   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  Quit  smoking in 1996.  Occasional ETOH  Married with 3 children. Wife is now in dementia unit at [facility].    ROS: All systems reviewed and negative except as per HPI.   Current Outpatient Medications  Medication Sig Dispense Refill  . aspirin EC 81 MG tablet Take 81 mg by mouth daily. Swallow whole.    . carvedilol (COREG) 25 MG tablet TAKE 1 TABLET BY MOUTH TWICE A DAY 180 tablet 3  . ENTRESTO 97-103 MG TAKE 1 TABLET BY  MOUTH TWICE A DAY 60 tablet 6  . isosorbide-hydrALAZINE (BIDIL) 20-37.5 MG tablet Take 1 tablet by mouth 3 (three) times daily. 90 tablet 3  . Multiple Vitamin (MULTIVITAMIN WITH MINERALS) TABS tablet Take 1 tablet by mouth daily.    . simvastatin (ZOCOR) 40 MG tablet Take 40 mg by mouth daily.     Marland Kitchen spironolactone (ALDACTONE) 25 MG tablet Take 0.5 tablets (12.5 mg total) by mouth daily. 45 tablet 3   No current facility-administered medications for this encounter.    BP 130/70   Pulse 65   Wt 114.8 kg (253 lb)   SpO2 98%   BMI 37.36 kg/m  General: NAD Neck: No JVD, no thyromegaly or thyroid nodule.  Lungs: Clear to auscultation bilaterally with normal respiratory effort. CV: Nondisplaced PMI.  Heart regular S1/S2, no S3/S4, no murmur.  No peripheral edema.  No carotid bruit.  Normal pedal pulses.  Abdomen: Soft, nontender, no hepatosplenomegaly, no distention.  Skin: Intact without lesions or rashes.  Neurologic: Alert and oriented x 3.  Psych: Normal affect. Extremities: No clubbing or cyanosis.  HEENT: Normal.   Assessment/Plan: 1. Nonischemic cardiomyopathy: Possibly due to viral myocarditis versus HTN.  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 Echo in 4/19 showed EF 25-30%.  No CAD on 4/19 cath, but CI marginal at 2.01 with mildly elevated PCWP.  Echo in 9/21 showed EF fairly stable at 30%.  NYHA class II symptoms.  Not volume overloaded on exam.  BP is stable today, I think that low BP during 12/21 admission may have been related to UTI.  - He refused an ICD in the past, but with recent unexplained syncope, think it would be reasonable to revisit ICD placement.  He is willing to see EP, I will refer.    - Continue Coreg 25 mg bid.  - Restart spironolactone at 12.5 mg bid and restart Bidil at 1 tab tid.   - Continue Entresto 97/103 bid.   - Will leave off Jardiance given recent UTI.  - BMET today and in 10 days.  2.  HTN: BP controlled.  3. Hyperlipidemia: Good lipids in 5/21.  4. Syncope: Unexplained, occurred while seated.  No prodrome.  He had a UTI, possible that UTI and dehydration could have played role but would expect prodrome.   - He is wearing a 2 wk Zio patch.  - As above, will refer to EP for ICD consideration.   Followup in 3 months  Loralie Champagne 12/20/2020

## 2020-12-21 ENCOUNTER — Other Ambulatory Visit: Payer: Self-pay

## 2020-12-21 ENCOUNTER — Ambulatory Visit (HOSPITAL_COMMUNITY)
Admission: RE | Admit: 2020-12-21 | Discharge: 2020-12-21 | Disposition: A | Discharge status: Home / Self Care | Payer: PPO | Source: Ambulatory Visit | Attending: Cardiology | Admitting: Cardiology

## 2020-12-21 ENCOUNTER — Other Ambulatory Visit (HOSPITAL_COMMUNITY): Payer: Self-pay | Admitting: *Deleted

## 2020-12-21 ENCOUNTER — Other Ambulatory Visit (HOSPITAL_COMMUNITY): Payer: Self-pay | Admitting: Cardiology

## 2020-12-21 DIAGNOSIS — R55 Syncope and collapse: Secondary | ICD-10-CM

## 2020-12-22 DIAGNOSIS — R739 Hyperglycemia, unspecified: Secondary | ICD-10-CM | POA: Diagnosis not present

## 2020-12-22 DIAGNOSIS — E785 Hyperlipidemia, unspecified: Secondary | ICD-10-CM | POA: Diagnosis not present

## 2020-12-22 DIAGNOSIS — Z125 Encounter for screening for malignant neoplasm of prostate: Secondary | ICD-10-CM | POA: Diagnosis not present

## 2020-12-25 DIAGNOSIS — Z7689 Persons encountering health services in other specified circumstances: Secondary | ICD-10-CM | POA: Diagnosis not present

## 2020-12-29 DIAGNOSIS — I429 Cardiomyopathy, unspecified: Secondary | ICD-10-CM | POA: Diagnosis not present

## 2020-12-29 DIAGNOSIS — I5022 Chronic systolic (congestive) heart failure: Secondary | ICD-10-CM | POA: Diagnosis not present

## 2020-12-29 DIAGNOSIS — I501 Left ventricular failure: Secondary | ICD-10-CM | POA: Diagnosis not present

## 2020-12-29 DIAGNOSIS — Z0000 Encounter for general adult medical examination without abnormal findings: Secondary | ICD-10-CM | POA: Diagnosis not present

## 2020-12-29 DIAGNOSIS — N182 Chronic kidney disease, stage 2 (mild): Secondary | ICD-10-CM | POA: Diagnosis not present

## 2020-12-29 DIAGNOSIS — I130 Hypertensive heart and chronic kidney disease with heart failure and stage 1 through stage 4 chronic kidney disease, or unspecified chronic kidney disease: Secondary | ICD-10-CM | POA: Diagnosis not present

## 2020-12-29 DIAGNOSIS — I2721 Secondary pulmonary arterial hypertension: Secondary | ICD-10-CM | POA: Diagnosis not present

## 2020-12-29 DIAGNOSIS — I77819 Aortic ectasia, unspecified site: Secondary | ICD-10-CM | POA: Diagnosis not present

## 2020-12-29 DIAGNOSIS — I493 Ventricular premature depolarization: Secondary | ICD-10-CM | POA: Diagnosis not present

## 2020-12-29 DIAGNOSIS — D649 Anemia, unspecified: Secondary | ICD-10-CM | POA: Diagnosis not present

## 2020-12-29 DIAGNOSIS — E785 Hyperlipidemia, unspecified: Secondary | ICD-10-CM | POA: Diagnosis not present

## 2020-12-29 DIAGNOSIS — R82998 Other abnormal findings in urine: Secondary | ICD-10-CM | POA: Diagnosis not present

## 2020-12-29 DIAGNOSIS — Z23 Encounter for immunization: Secondary | ICD-10-CM | POA: Diagnosis not present

## 2021-01-04 ENCOUNTER — Encounter: Payer: Self-pay | Admitting: Internal Medicine

## 2021-01-04 ENCOUNTER — Ambulatory Visit (INDEPENDENT_AMBULATORY_CARE_PROVIDER_SITE_OTHER): Payer: PPO | Admitting: Internal Medicine

## 2021-01-04 ENCOUNTER — Other Ambulatory Visit: Payer: Self-pay

## 2021-01-04 VITALS — BP 118/78 | HR 72 | Ht 69.0 in | Wt 254.4 lb

## 2021-01-04 DIAGNOSIS — I420 Dilated cardiomyopathy: Secondary | ICD-10-CM | POA: Diagnosis not present

## 2021-01-04 DIAGNOSIS — I501 Left ventricular failure: Secondary | ICD-10-CM | POA: Diagnosis not present

## 2021-01-04 NOTE — Patient Instructions (Signed)
Medication Instructions:  Your physician recommends that you continue on your current medications as directed. Please refer to the Current Medication list given to you today.   *If you need a refill on your cardiac medications before your next appointment, please call your pharmacy*   Lab Work: None ordered.  If you have labs (blood work) drawn today and your tests are completely normal, you will receive your results only by: . MyChart Message (if you have MyChart) OR . A paper copy in the mail If you have any lab test that is abnormal or we need to change your treatment, we will call you to review the results.   Testing/Procedures: None ordered.    Follow-Up: At [facility], you and your health needs are our priority.  As part of our continuing mission to provide you with exceptional heart care, we have created designated Provider Care Teams.  These Care Teams include your primary Cardiologist (physician) and Advanced Practice Providers (APPs -  Physician Assistants and Nurse Practitioners) who all work together to provide you with the care you need, when you need it.  We recommend signing up for the patient portal called "MyChart".  Sign up information is provided on this After Visit Summary.  MyChart is used to connect with patients for Virtual Visits (Telemedicine).  Patients are able to view lab/test results, encounter notes, upcoming appointments, etc.  Non-urgent messages can be sent to your provider as well.   To learn more about what you can do with MyChart, go to [email or web address].    Your next appointment:   You will see Dr Klein as needed.  

## 2021-01-04 NOTE — Progress Notes (Signed)
ELECTROPHYSIOLOGY CONSULT NOTE  Patient ID: Kevin Murillo, MRN: [id number], DOB/AGE: 1945/12/22 75 y.o. Admit date: (Not on file) Date of Consult: 01/04/2021  Primary Physician: Shon Baton, MD Primary Cardiologist: DM     Kevin Murillo is a 75 y.o. male who is being seen today for the evaluation of ICD at the request of Dr. Benjamine Mola.    HPI Kevin Murillo is a 75 y.o. male referred for consideration of an ICD for syncope in the setting of nonischemic cardiomyopathy  Cardiomyopathy originally diagnosed 2011 discussions regarding an ICD in the past.  The patient has been modestly reluctant.  Walks with a walker.  He denies limiting shortness of breath.  He does not have nocturnal dyspnea or orthopnea.  No peripheral edema.  No chest pain.  No palpitations.  His syncope was thought to be related to the antecedent initiation of an SGLT2 drug, on arrival of EMS, these records been reviewed, his blood pressure was 72  palpation; no arrhythmias were noted  Telemetry strips were reviewed.  They showed PVCs.  Electrocardiogram 11/26/2020 showed no PVCs  Event recorder is currently pending  DATE TEST EF   6/11 Echo   30-35 %   4/19 cMRI 26% Non-compaction LGE multiple segments Cw/ MI Cx distribution  4/19 LHC  No obstruc CAD  2/20 Echo   30-35 %   9/21 Echo  30%    Date Cr K Hgb  12/21 1.37<<1.71 4.7 12.2      Past Medical History:  Diagnosis Date  . Chronic systolic CHF (congestive heart failure), NYHA class 1 ([place])   . History of echocardiogram    Echo 3/16: Mild LVH, EF 40-45%, anteroseptal akinesis, grade 1 diastolic dysfunction, moderate LAE // Echo 3/19: diff HK worse in basal and mid inf wall, mild LVH, EF 25-30, mild MR, mild LAE  . Hyperlipidemia   . Hypertension   . Neuropathy of hand   . Non-ischemic cardiomyopathy ([place])    a. Echo 6/11: 30-35% // b. LHC 7/11: EF 30-35% with diffuse hypokinesis, no angiographic CAD. SPEP and Fe studies  unremarkable, TSH normal.//  c. Echo 1/12: EF 45% // d. Echo 6/14: EF 20-25%  //  e. Echo EF 25-30%, diffuse hypokinesis, prominent apical trabeculation // f. CPX 3/15: normal fxnal capacity  // g. CPX 3/16 low norma fxnal capacity  // h. Echo 3/16: mild LVH, EF 40-45%   . Obesity   . PVC (premature ventricular contraction)   . Rosacea   . Tubular adenoma of colon 12/2010      Surgical History:  Past Surgical History:  Procedure Laterality Date  . ARM SURGERY  1967   RIGHT   . COLONOSCOPY    . COLONOSCOPY WITH PROPOFOL N/A 08/28/2019   Procedure: COLONOSCOPY WITH PROPOFOL;  Surgeon: Gatha Mayer, MD;  Location: [facility];  Service: Endoscopy;  Laterality: N/A;  . CYST EXCISION Left 07/29/2019   Procedure: EXCISION OF CHRONIC LEFT LOWER BACK CYST;  Surgeon: Coralie Keens, MD;  Location: [place] [facility];  Service: General;  Laterality: Left;  . KNEE ARTHROSCOPY Left   . RIGHT/LEFT HEART CATH AND CORONARY ANGIOGRAPHY N/A 03/09/2018   Procedure: RIGHT/LEFT HEART CATH AND CORONARY ANGIOGRAPHY;  Surgeon: Larey Dresser, MD;  Location: [facility];  Service: Cardiovascular;  Laterality: N/A;  . TOTAL HIP ARTHROPLASTY Right 11/27/2019   Procedure: TOTAL HIP ARTHROPLASTY ANTERIOR APPROACH;  Surgeon: Rod Can, MD;  Location: [facility];  Service: Orthopedics;  Laterality: Right;     Home Meds: Current Meds  Medication Sig  . aspirin EC 81 MG tablet Take 81 mg by mouth daily. Swallow whole.  . carvedilol (COREG) 25 MG tablet TAKE 1 TABLET BY MOUTH TWICE A DAY  . ENTRESTO 97-103 MG TAKE 1 TABLET BY MOUTH TWICE A DAY  . isosorbide-hydrALAZINE (BIDIL) 20-37.5 MG tablet Take 1 tablet by mouth 3 (three) times daily.  . Multiple Vitamin (MULTIVITAMIN WITH MINERALS) TABS tablet Take 1 tablet by mouth daily.  . simvastatin (ZOCOR) 40 MG tablet Take 40 mg by mouth daily.   Marland Kitchen spironolactone (ALDACTONE) 25 MG tablet Take 0.5 tablets (12.5 mg total) by mouth daily.    Allergies: No Known  Allergies  Social History   Socioeconomic History  . Marital status: Married    Spouse name: Not on file  . Number of children: 2  . Years of education: Not on file  . Highest education level: Not on file  Occupational History  . Occupation: Retired Games developer: [employer]  . Occupation: Buyer, retail  Tobacco Use  . Smoking status: Former Smoker    Types: Cigarettes    Quit date: 03/12/2002    Years since quitting: 18.8  . Smokeless tobacco: Never Used  Vaping Use  . Vaping Use: Never used  Substance and Sexual Activity  . Alcohol use: Yes    Alcohol/week: 2.0 standard drinks    Types: 1 Cans of beer, 1 Standard drinks or equivalent per week    Comment: Socially  . Drug use: No  . Sexual activity: Not on file  Other Topics Concern  . 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 CVA Mother   . Heart attack Other   . Gout Son   . Colon cancer Neg Hx      ROS:  Please see the history of present illness.   Physical Exam:  Blood pressure 118/78, pulse 72, height 5\' 9"  (1.753 m), weight 254 lb 6.4 oz (115.4 kg), SpO2 99 %. General: Well developed, well nourished male in no acute distress. Head: Normocephalic,  atraumatic, sclera non-icteric, no xanthomas, nares are without discharge. EENT: normal  Lymph Nodes:  none Neck: Negative for carotid bruits. JVD not elevated. Back:without scoliosis kyphosis  Lungs: Clear bilaterally to auscultation without wheezes, rales, or rhonchi. Breathing is unlabored. Heart: RRR with S1 S2. No  * murmur . No rubs, or gallops appreciated. Abdomen: Soft, non-tender, non-distended with normoactive bowel sounds. No hepatomegaly. No rebound/guarding. No obvious abdominal masses. Msk:  Strength and tone appear normal for age. Extremities:  No clubbing or cyanosis. No * edema.  Distal pedal pulses are 2+ and equal bilaterally. Skin: Warm and Dry Neuro: Alert and oriented X 3. CN III-XII intact Grossly normal sensory and motor function . Psych:  Responds to questions appropriately with a normal affect.      Labs: Cardiac Enzymes No results for input(s): CKTOTAL, CKMB, TROPONINI in the last 72 hours. CBC Lab Results  Component Value Date   WBC 6.1 11/27/2020   HGB 12.2 (L) 11/27/2020   HCT 38.7 (L) 11/27/2020   MCV 94.2 11/27/2020   PLT 219 11/27/2020   PROTIME: No results for input(s): LABPROT, INR in the last 72 hours. Chemistry No results for input(s): NA, K, CL, CO2, BUN, CREATININE, CALCIUM, PROT, BILITOT, ALKPHOS, ALT, AST, GLUCOSE in the last 168 hours.  Invalid input(s): LABALBU Lipids Lab Results  Component Value Date   CHOL 116 04/17/2020   HDL 52 04/17/2020   LDLCALC 47 04/17/2020   TRIG 84 04/17/2020   BNP Pro B Natriuretic peptide (BNP)  Date/Time Value Ref Range Status  01/12/2015 10:41 AM 69.0 0.0 - 100.0 pg/mL Final  07/16/2014 09:51 AM 21.0 0.0 - 100.0 pg/mL Final  12/09/2013 04:33 PM 51.0 0.0 - 100.0 pg/mL Final  06/14/2010 08:44 AM 85.9 0.0 - 100.0 pg/mL Final   Thyroid Function Tests: No results for input(s): TSH, T4TOTAL, T3FREE, THYROIDAB in the last 72 hours.  Invalid input(s): FREET3 Miscellaneous No results found for:  DDIMER  Radiology/Studies:  No results found.  EKG: Sinus at 72 Normal 22/12/40 Right axis deviation 179 PVCs left bundle indeterminate axis   Assessment and Plan:  Nonischemic cardiomyopathy-noncompaction  Syncope-hypotensive  PVCs question frequency  Congestive heart failure-class IIb-IIIa  Right axis deviation   Patient has nonischemic cardiomyopathy and a borderline ejection fraction for consideration of an ICD.  He has in general been reluctant.  Based on [place], his age would suggest a attenuated benefit.  In the modern era, particularly on Entresto, comparative data are no longer available although 1 would expect based on impact on overall mortality that ICD benefit would be further attenuated.  Dr. Aundra Dubin is also appreciated the PVCs and a monitor is pending.  In the event that he had 5-10% PVCs with think in terms of attenuation with antiarrhythmic therapy prior to making a decision for ICD implantation especially given his reluctance and the mitigating factors outlined above.  Having noted the above however, apart from the [place] issue, meta analyses have suggested that there is a role still for ICD implantation in septuagenarian's and if the PVCs are not an issue and the patient after discussion with Dr. Aundra Dubin he would like to pursue ICD implantation we have reviewed the potential benefits as well as the potential risks of the device including but not limited to death, perforation, lead dislodgment infection and inappropriate shocks  Virl Axe

## 2021-01-05 NOTE — Progress Notes (Incomplete)
***In Progress*** PCP: Dr. Virgina Jock Cardiology: Dr. Aundra Dubin  HPI:   75 y.o.with history of HTN and nonischemic cardiomyopathy (diagnosed in 2011) presents for cardiology followup.  He had an echo in 11/2013, showing that EF remains 25-30% with mild LV dilation.  He performed a cardiopulmonary exercise test in 01/2014 that showed normal functional capacity.  Echo in 02/2018 showed EF remains 25-30%.  Repeat LHC/RHC in 02/2018 showed no significant coronary disease, mildly elevated LV filling pressure, and CI 2.01.   Echo in 12/2018 showed EF 30-35%, normal RV. Echo in 07/2020 showed EF 30% with mild LV dilation, diffuse hypokinesis.   In 10/2020, he was sitting in a restaurant eating breakfast when he passed out suddenly with no prodrome. He quickly regained consciousness. He was admitted to the hospital.  He was found to have a UTI, Jardiance was stopped.  His BP was low, ?due to urosepsis/dehydration.  He was also taken off Bidil and spironolactone. No prior dizzy spells and none since.  He is now wearing a Zio patch.  He returned for followup of CHF on 12/18/20.  Reported using a walker when he walks long distances due to back and hip pain.  No chest pain.  No significant exertional dyspnea.  No orthopnea/PND.  Weight was up 2 lbs.  Today he returns to HF clinic for pharmacist medication titration. At last visit with MD, spironolactone was restarted at 12.5 mg daily.   Overall feeling ***. Dizziness, lightheadedness, fatigue:  Chest pain or palpitations:  How is your breathing?: *** SOB: Able to complete all ADLs. Activity level ***  Weight at home pounds. Takes furosemide/torsemide/bumex *** mg *** daily.  LEE PND/Orthopnea  Appetite *** Low-salt diet:   Physical Exam Cost/affordability of meds  HF Medications: Carvedilol 25 mg BID Entresto 97-103 mg BID Spironolactone 12.5 mg daily Isosorbide/Hydralazine (Bidil) 20-37.5 mg TID  Has the patient been experiencing any side effects to  the medications prescribed?  {YES NO:22349}  Does the patient have any problems obtaining medications due to transportation or finances?   {YES NO:22349}  Understanding of regimen: {excellent/good/fair/poor:19665} Understanding of indications: {excellent/good/fair/poor:19665} Potential of compliance: {excellent/good/fair/poor:19665} Patient understands to avoid NSAIDs. Patient understands to avoid decongestants.    Pertinent Lab Values (11/27/2020) . Serum creatinine 1.37, BUN 25, Potassium 4.0, Sodium 138   Vital Signs: . Weight: *** (last clinic weight: ***) . Blood pressure: ***  . Heart rate: ***   Assessment/Plan: 1. Nonischemic cardiomyopathy: Possibly due to viral myocarditis versus HTN.  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 Echo in 02/2018 showed EF 25-30%.  No CAD on 02/2018 cath, but CI marginal at 2.01 with mildly elevated PCWP.  Echo in 07/2020 showed EF fairly stable at 30%.  NYHA class II symptoms.  Not volume overloaded on exam.  BP is stable today, I think that low BP during 10/2020 admission may have been related to UTI.  - He refused an ICD in the past, but with recent unexplained syncope, think it would be reasonable to revisit ICD placement.  He is willing to see EP, I will refer.    - Continue carvedilol 25 mg bid.  - Continue Entresto 97/103 bid. - Continue spironolactone 12.5 mg daily  - Continue isosorbide/hydralazine (Bidil) 20-37.5 mg TID     - Will leave off Jardiance given recent UTI.  - BMET today and in 10 days.   2. HTN: BP controlled.   3. Hyperlipidemia:  - Good lipids in  03/2020. - LDL: 47   4. Syncope: Unexplained, occurred while seated.  No prodrome.  He had a UTI, possible that UTI and dehydration could have played role but would expect prodrome.   - He is wearing a 2 wk Zio patch.  - As above, will refer to EP for ICD consideration.    Audry Riles, PharmD, BCPS, BCCP, CPP [facility]  Failure Clinic Pharmacist [phone]

## 2021-01-06 ENCOUNTER — Ambulatory Visit (HOSPITAL_COMMUNITY)
Admission: RE | Admit: 2021-01-06 | Discharge: 2021-01-06 | Disposition: A | Discharge status: Home / Self Care | Payer: PPO | Source: Ambulatory Visit | Attending: Internal Medicine | Admitting: Internal Medicine

## 2021-01-06 ENCOUNTER — Other Ambulatory Visit: Payer: Self-pay

## 2021-01-06 ENCOUNTER — Telehealth (HOSPITAL_COMMUNITY): Payer: Self-pay | Admitting: Pharmacy Technician

## 2021-01-06 ENCOUNTER — Other Ambulatory Visit (HOSPITAL_COMMUNITY): Payer: Self-pay | Admitting: Cardiology

## 2021-01-06 VITALS — BP 102/50 | HR 81 | Wt 253.6 lb

## 2021-01-06 DIAGNOSIS — I5022 Chronic systolic (congestive) heart failure: Secondary | ICD-10-CM

## 2021-01-06 DIAGNOSIS — E785 Hyperlipidemia, unspecified: Secondary | ICD-10-CM | POA: Diagnosis not present

## 2021-01-06 DIAGNOSIS — I428 Other cardiomyopathies: Secondary | ICD-10-CM | POA: Insufficient documentation

## 2021-01-06 DIAGNOSIS — R55 Syncope and collapse: Secondary | ICD-10-CM | POA: Insufficient documentation

## 2021-01-06 DIAGNOSIS — I10 Essential (primary) hypertension: Secondary | ICD-10-CM | POA: Insufficient documentation

## 2021-01-06 LAB — BASIC METABOLIC PANEL
Anion gap: 10 (ref 5–15)
BUN: 21 mg/dL (ref 8–23)
CO2: 24 mmol/L (ref 22–32)
Calcium: 9.1 mg/dL (ref 8.9–10.3)
Chloride: 105 mmol/L (ref 98–111)
Creatinine, Ser: 1.39 mg/dL — ABNORMAL HIGH (ref 0.61–1.24)
GFR, Estimated: 53 mL/min — ABNORMAL LOW (ref >60)
Glucose, Bld: 111 mg/dL — ABNORMAL HIGH (ref 70–99)
Potassium: 4.2 mmol/L (ref 3.5–5.1)
Sodium: 139 mmol/L (ref 135–145)

## 2021-01-06 MED ORDER — ENTRESTO 97-103 MG PO TABS: 1.0000 | ORAL_TABLET | Freq: Two times a day (BID) | ORAL | 3 refills | Status: DC

## 2021-01-06 MED ORDER — BIDIL 20-37.5 MG PO TABS: 1.0000 | ORAL_TABLET | Freq: Three times a day (TID) | ORAL | 11 refills | Status: DC

## 2021-01-06 MED ORDER — SPIRONOLACTONE 25 MG PO TABS: 25.0000 mg | ORAL_TABLET | Freq: Every day | ORAL | 11 refills | Status: DC

## 2021-01-06 MED FILL — ENTRESTO 97 MG-103 MG TAB: 97-103 | 90 days supply | Qty: 180 | Fill #0

## 2021-01-06 MED FILL — BIDIL 20-37.5 MG TABS: 20-37.5 | 30 days supply | Qty: 90 | Fill #0

## 2021-01-06 NOTE — Progress Notes (Signed)
PCP: Dr. Virgina Jock Cardiology: Dr. Aundra Dubin  HPI:   75 y.o.with history of HTN and nonischemic cardiomyopathy (diagnosed in 2011) presented for cardiology followup.  He had an echo in 11/2013, showing that EF remains 25-30% with mild LV dilation.  He performed a cardiopulmonary exercise test in 01/2014 that showed normal functional capacity.  Echo in 02/2018 showed EF remained 25-30%. Repeat LHC/RHC in 02/2018 showed no significant coronary disease, mildly elevated LV filling pressure, and CI 2.01.   Echo in 12/2018 showed EF 30-35%, normal RV. Echo in 07/2020 showed EF 30% with mild LV dilation, diffuse hypokinesis.   In 10/2020, he was sitting in a restaurant eating breakfast when he passed out suddenly with no prodrome. He quickly regained consciousness. He was admitted to the hospital.  He was found to have a UTI so Jardiance was stopped.  His BP was low, ?due to urosepsis/dehydration.  He was also taken off Bidil and spironolactone. No prior dizzy spells and none since. He was wearing a Zio patch.  He returned to HF Clinic for followup of CHF on 12/18/20.  Reported using a walker when he walks long distances due to back and hip pain.  No chest pain.  No significant exertional dyspnea.  No orthopnea/PND.  Weight was up 2 lbs.  Today he returns to HF clinic for pharmacist medication titration. At last visit with MD, spironolactone 12.5 mg daily and Bidil 20-37.5 mg 1 tablet TID were restarted. Overall feeling well today. No dizziness or lightheadedness. No chest pain or palpitations. No SOB/DOE. Uses either a walker or wheelchair for back and hip pain. Weight at home is stable at around 250 pounds. No LEE/PND/Orthopnea. No diuretic use at home. Taking all medications as prescribed and tolerating all medications.   HF Medications: Carvedilol 25 mg BID Entresto 97-103 mg BID Spironolactone 12.5 mg daily Bidil 20-37.5 mg 1 tablet TID  Has the patient been experiencing any side effects to the medications  prescribed?  No, patient is tolerating medications well.   Does the patient have any problems obtaining medications due to transportation or finances?   Patient has Environmental education officer, as well as a Medicare advantage plan. He has prescription insurance through both plans. We were able to activate copay cards for Entresto and Bidil to use with his BCBS plan (unable to use with Medicare plan). This should decrease his Entresto copay to $10 and Bidil copay to $25. Will fill Entresto and Bidil at [facility] from now on to decrease confusion ([facility] only has Medicare information). [facility]  Understanding of regimen: good Understanding of indications: good Potential of compliance: good Patient understands to avoid NSAIDs. Patient understands to avoid decongestants.    Pertinent Lab Values (01/06/2021) . Serum creatinine 1.39, BUN 21, Potassium 4.2, Sodium 139   Vital Signs: . Weight: 253.6 lbs (last clinic weight: 254.4 lbs) . Blood pressure: 102/50  . Heart rate: 81   Assessment/Plan: 1. Nonischemic cardiomyopathy: Possibly due to viral myocarditis versus HTN.  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 Echo in 02/2018 showed EF 25-30%.  No CAD on 02/2018 cath, but CI marginal at 2.01 with mildly elevated PCWP.  Echo in 07/2020 showed EF fairly stable at 30%.  - NYHA class II symptoms. Euvolemic on exam.    - Continue carvedilol 25 mg BID.  - Continue Entresto 97/103 mg BID. - Increase spironolactone to 25 mg daily. Repeat BMET in  10 days.   - Continue Bidil 20-37.5 mg 1 tablet TID     - Failed Jardiance due to urosepsis in 10/2020.  - Follow-up in HF Clinic with Dr. Aundra Dubin on 02/26/2021.   2. HTN: - BP controlled today at 102/50 - Pt was not experiencing symptoms of hypotension.   3. Hyperlipidemia:   - LDL: 47 on 04/17/2020. - Continue simvastatin 40 mg daily  4. Syncope: Unexplained,  occurred while seated.  No prodrome.  He had a UTI, possible that UTI and dehydration could have played role but would expect prodrome.    - Referred to EP for ICD consideration.    Audry Riles, PharmD, BCPS, BCCP, CPP Heart Failure Clinic Pharmacist [phone]

## 2021-01-06 NOTE — Telephone Encounter (Signed)
Entresto $10 co-pay card   BIN [id number] PCN OHCP ID [id number] Group [id number]

## 2021-01-06 NOTE — Patient Instructions (Addendum)
It was a pleasure seeing you today!  MEDICATIONS: -We are changing your medications today -Increase spironolactone to 25 mg (1 tablet) daily -Call if you have questions about your medications.  LABS: -We will call you if your labs need attention.  NEXT APPOINTMENT: Return to clinic in 2 months with Dr. Aundra Dubin.  In general, to take care of your heart failure: -Limit your fluid intake to 2 Liters (half-gallon) per day.   -Limit your salt intake to ideally 2-3 grams (2000-3000 mg) per day. -Weigh yourself daily and record, and bring that "weight diary" to your next appointment.  (Weight gain of 2-3 pounds in 1 day typically means fluid weight.) -The medications for your heart are to help your heart and help you live longer.   -Please contact us before stopping any of your heart medications.  Call the clinic at [phone] with questions or to reschedule future appointments.

## 2021-01-08 ENCOUNTER — Ambulatory Visit (HOSPITAL_COMMUNITY)
Admission: RE | Admit: 2021-01-08 | Discharge: 2021-01-08 | Disposition: A | Discharge status: Home / Self Care | Payer: PPO | Source: Ambulatory Visit | Attending: Cardiology | Admitting: Cardiology

## 2021-01-11 NOTE — Telephone Encounter (Signed)
Upon investigation, patient is able to use a co-pay card for [facility] and due to UTI Jardiance was stopped.   Will be here to assist in the future, as needed.  Charlann Boxer, CPhT

## 2021-01-12 DIAGNOSIS — R55 Syncope and collapse: Secondary | ICD-10-CM | POA: Diagnosis not present

## 2021-01-15 IMAGING — DX DG PORTABLE PELVIS
1 series · 1 of 1 positions shown · non-contrast
Comparison: Earlier intraoperative fluoroscopic image dated
11/27/2019.

CLINICAL DATA: 73-year-old male status post right hip arthroplasty.

EXAM:
PORTABLE PELVIS 1-2 VIEWS

[pelvis ap]
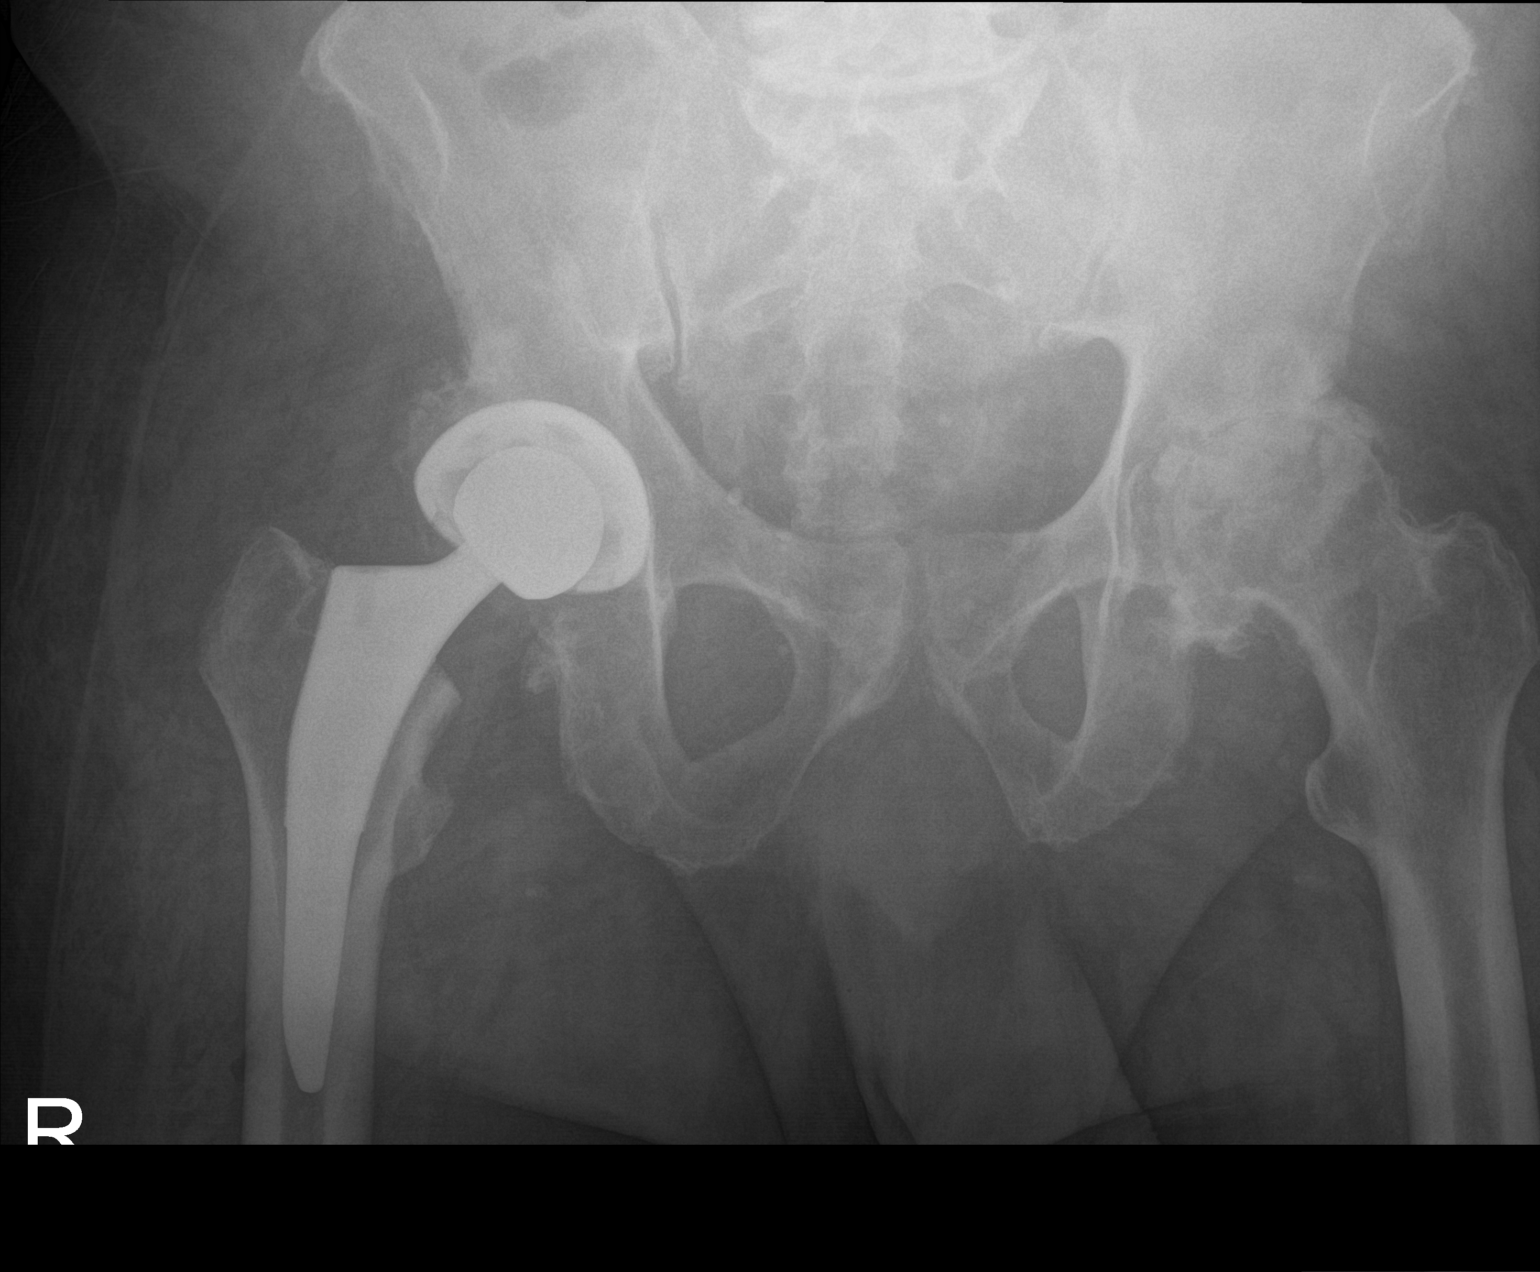

[1 of 1 positions shown; findings below may reference images not displayed]

FINDINGS: Total right hip arthroplasty. The arthroplasty components appear
intact and in anatomic alignment. There is no acute fracture or
dislocation. Severe arthritic changes of the left hip. The soft
tissues are unremarkable.
IMPRESSION: Right hip arthroplasty appears intact.  No immediate complication.

## 2021-01-18 ENCOUNTER — Ambulatory Visit (HOSPITAL_COMMUNITY)
Admission: RE | Admit: 2021-01-18 | Discharge: 2021-01-18 | Disposition: A | Discharge status: Home / Self Care | Payer: PPO | Source: Ambulatory Visit | Attending: Cardiology | Admitting: Cardiology

## 2021-01-18 ENCOUNTER — Other Ambulatory Visit: Payer: Self-pay

## 2021-02-26 ENCOUNTER — Other Ambulatory Visit (HOSPITAL_COMMUNITY): Payer: Self-pay

## 2021-02-26 ENCOUNTER — Encounter (HOSPITAL_COMMUNITY): Payer: PPO | Admitting: Cardiology

## 2021-04-02 ENCOUNTER — Ambulatory Visit (HOSPITAL_COMMUNITY)
Admission: RE | Admit: 2021-04-02 | Discharge: 2021-04-02 | Disposition: A | Discharge status: Home / Self Care | Payer: PPO | Source: Ambulatory Visit | Attending: Cardiology | Admitting: Cardiology

## 2021-04-02 ENCOUNTER — Other Ambulatory Visit: Payer: Self-pay

## 2021-04-02 ENCOUNTER — Encounter (HOSPITAL_COMMUNITY): Payer: Self-pay | Admitting: Cardiology

## 2021-04-02 VITALS — BP 130/70 | HR 58 | Wt 256.2 lb

## 2021-04-02 DIAGNOSIS — Z8249 Family history of ischemic heart disease and other diseases of the circulatory system: Secondary | ICD-10-CM | POA: Diagnosis not present

## 2021-04-02 DIAGNOSIS — I428 Other cardiomyopathies: Secondary | ICD-10-CM | POA: Insufficient documentation

## 2021-04-02 DIAGNOSIS — I5022 Chronic systolic (congestive) heart failure: Secondary | ICD-10-CM

## 2021-04-02 DIAGNOSIS — Z7982 Long term (current) use of aspirin: Secondary | ICD-10-CM | POA: Diagnosis not present

## 2021-04-02 DIAGNOSIS — I110 Hypertensive heart disease with heart failure: Secondary | ICD-10-CM | POA: Diagnosis not present

## 2021-04-02 DIAGNOSIS — R55 Syncope and collapse: Secondary | ICD-10-CM | POA: Diagnosis not present

## 2021-04-02 DIAGNOSIS — E785 Hyperlipidemia, unspecified: Secondary | ICD-10-CM | POA: Diagnosis not present

## 2021-04-02 DIAGNOSIS — Z79899 Other long term (current) drug therapy: Secondary | ICD-10-CM | POA: Insufficient documentation

## 2021-04-02 DIAGNOSIS — I501 Left ventricular failure: Secondary | ICD-10-CM | POA: Insufficient documentation

## 2021-04-02 DIAGNOSIS — Z87891 Personal history of nicotine dependence: Secondary | ICD-10-CM | POA: Insufficient documentation

## 2021-04-02 LAB — LIPID PANEL
Cholesterol: 119 mg/dL (ref 0–200)
HDL: 44 mg/dL (ref >40)
LDL Cholesterol: 55 mg/dL (ref 0–99)
Total CHOL/HDL Ratio: 2.7 RATIO
Triglycerides: 102 mg/dL (ref <150)
VLDL: 20 mg/dL (ref 0–40)

## 2021-04-02 LAB — BASIC METABOLIC PANEL
Anion gap: 8 (ref 5–15)
BUN: 20 mg/dL (ref 8–23)
CO2: 24 mmol/L (ref 22–32)
Calcium: 9.2 mg/dL (ref 8.9–10.3)
Chloride: 104 mmol/L (ref 98–111)
Creatinine, Ser: 1.25 mg/dL — ABNORMAL HIGH (ref 0.61–1.24)
GFR, Estimated: 60 mL/min — ABNORMAL LOW (ref >60)
Glucose, Bld: 108 mg/dL — ABNORMAL HIGH (ref 70–99)
Potassium: 4.5 mmol/L (ref 3.5–5.1)
Sodium: 136 mmol/L (ref 135–145)

## 2021-04-02 NOTE — Patient Instructions (Addendum)
Labs done today. We will contact you only if your labs are abnormal.  No medication changes were made. Please continue all current medications as prescribed.  Your physician recommends that you schedule a follow-up appointment in: 4 months.   If you have any questions or concerns before your next appointment please send us a message through mychart or call our office at [phone].    TO LEAVE A MESSAGE FOR THE NURSE SELECT OPTION 2, PLEASE LEAVE A MESSAGE INCLUDING: . YOUR NAME . DATE OF BIRTH . CALL BACK NUMBER . REASON FOR CALL**this is important as we prioritize the call backs  YOU WILL RECEIVE A CALL BACK THE SAME DAY AS LONG AS YOU CALL BEFORE 4:00 PM   Do the following things EVERYDAY: 1) Weigh yourself in the morning before breakfast. Write it down and keep it in a log. 2) Take your medicines as prescribed 3) Eat low salt foods--Limit salt (sodium) to 2000 mg per day.  4) Stay as active as you can everyday 5) Limit all fluids for the day to less than 2 liters   At the [facility], you and your health needs are our priority. As part of our continuing mission to provide you with exceptional heart care, we have created designated Provider Care Teams. These Care Teams include your primary Cardiologist (physician) and Advanced Practice Providers (APPs- Physician Assistants and Nurse Practitioners) who all work together to provide you with the care you need, when you need it.   You may see any of the following providers on your designated Care Team at your next follow up: . Dr Daniel Bensimhon . Dr Dalton McLean . Amy Clegg, NP . Brittainy Simmons, PA . Lauren Kemp, PharmD   Please be sure to bring in all your medications bottles to every appointment.   

## 2021-04-04 NOTE — Progress Notes (Signed)
Patient ID: Kevin Murillo, male   DOB: 1946-11-16, 75 y.o.   MRN: [id number] PCP: Dr. Virgina Jock Cardiology: Dr. Aundra Dubin  75 y.o.with history of HTN and nonischemic cardiomyopathy (diagnosed in 2011) presents for cardiology followup.  He had an echo in 1/15, showing that EF remains 25-30% with mild LV dilation.  I had him do a cardiopulmonary exercise test in 3/15 that showed normal functional capacity.  Most recent echo in 4/19 showed EF remains 25-30%.  Repeat LHC/RHC in 4/19 showed no significant coronary disease, mildly elevated LV filling pressure, and CI 2.01.   Echo in 2/20 showed EF 30-35%, normal RV. Echo in 9/21 showed EF 30% with mild LV dilation, diffuse hypokinesis.   In 12/21, he was sitting in a restaurant eating breakfast when he passed out suddenly with no prodrome. He quickly regained consciousness. He was admitted to the hospital.  He was found to have a UTI, Jardiance was stopped.  His BP was low, ?due to urosepsis/dehydration.  He was also taken off Bidil and spironolactone. No prior dizzy spells and none since.  Zio patch in 2/22 showed 4% PVCs, short 4 beat NSVT run.  Patient saw Dr. Caryl Comes with EP, syncope possibly due to SGLT2 inhibitor-related UTI, Vania Rea has been stopped.   He return for followup of CHF.  Uses walker due to low back and hip pain for balance.  No dyspnea walking on flat ground.  No orthopnea/PND.  No chest pain.  No lightheadedness.  No further syncope. Weight up 3 lbs.   Labs (6/11): K 3.9, TSH normal, creatinine 1.0  Labs (7/11): HDL 60, LDL 122, K 4.4, creatinine 1.0, BNP 86, SPEP normal, transferrin saturation 24%  Labs (9/11): LDL 67, HDL 48, TGs 145, LFTs normal  Labs (10/11): K 4, creatinine 1.2  Labs (5/13): K 3.9, creatinine 1.0, LDL 66, HDL 44 Labs (7/14): K 3.9, creatinine 1.1 Labs (3/15): K 4.1, creatinine 1.1 Labs (8/15): K 4, creatinine 1.1, BNP 21 Labs (4/19): K 4.4, creatinine 1.06 Labs (5/19): K 4.4, creatinine 1.13 Labs (8/19): K  4.2, creatinine 1.29 Labs (11/19): K 4.1, creatinine 1.09 Labs (2/21): K 4.2, creatinine 1.63 Labs (5/21): K 4.3, creatinine 1.2, LDL 47 Labs (12/21): K 4, creatinine 1.37, Hgb 12.2 Labs (2/22): K 4.2, creatinine 1.39  Allergies (verified):  No Known Drug Allergies   Past Medical History:  1. HTN  2. Left hand neuropathy  3. Nonischemic cardiomyopathy: Echo (6/11) with EF 30-35%, posteroinferior severe hypokinesis, moderate diastolic dysfunction, mild left atrial enlargement, mild LV hypertrophy. LHC (7/11): EF 30-35% with diffuse hypokinesis, no angiographic CAD. SPEP and Fe studies unremarkable, TSH normal.  Repeat echo (1/12) with EF 45%, mild global hypokinesis, mild LVH, normal RV size and systolic function.  Echo (6/14) with EF 20-25%, diffuse hypokinesis, mild LV dilation. Echo (1/15) with mild LV dilation, EF 25-30%, diffuse hypokinesis, prominent apical trabeculation, mildly dilated RV. CPX (3/15) with VO2 max 20.1, VE/VCO2 slope 34.7 (near normal when adjusted to respiratory compensation point) => this CPX showed normal functional capacity for his age.  - Echo (4/19): EF 25-30%, mild MR.  - RHC/LHC (4/19): No significant CAD.  Mean RA 6, PA 46/18 mean 32, mean PCWP 21, CI 2.01, PVR 2.4 WU (pulmonary venous hypertension).  - Echo (2/20): EF 30-35%, moderate diastolic dysfunction, normal RV size and systolic function, moderate LAE.  - Echo (9/21): EF 30%, mild LV dilation, diffuse hypokinesis, normal RV.  4. Hyperlipidemia  5. Syncope (12/21): No prodrome.  - Zio patch  in 2/22 showed 4% PVCs, short 4 beat NSVT run.  Family History:  Mother died with CHF at 8.   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  Quit smoking in 1996.  Occasional ETOH  Married with 3 children. Wife is now in dementia unit at [facility].    ROS: All systems reviewed and negative except as per  HPI.   Current Outpatient Medications  Medication Sig Dispense Refill  . aspirin EC 81 MG tablet Take 81 mg by mouth daily. Swallow whole.    . carvedilol (COREG) 25 MG tablet TAKE 1 TABLET BY MOUTH TWICE A DAY 180 tablet 3  . isosorbide-hydrALAZINE (BIDIL) 20-37.5 MG tablet TAKE 1 TABLET BY MOUTH 3 TIMES DAILY  90 tablet 11  . Multiple Vitamin (MULTIVITAMIN WITH MINERALS) TABS tablet Take 1 tablet by mouth daily.    . sacubitril-valsartan (ENTRESTO) 97-103 MG TAKE 1 TABLET BY MOUTH 2 TIMES DAILY 180 tablet 3  . simvastatin (ZOCOR) 40 MG tablet Take 40 mg by mouth daily.     Marland Kitchen spironolactone (ALDACTONE) 25 MG tablet Take 1 tablet (25 mg total) by mouth daily. 30 tablet 11   No current facility-administered medications for this encounter.    BP 130/70   Pulse (!) 58   Wt 116.2 kg (256 lb 3.2 oz)   SpO2 100%   BMI 37.83 kg/m  General: NAD Neck: No JVD, no thyromegaly or thyroid nodule.  Lungs: Clear to auscultation bilaterally with normal respiratory effort. CV: Nondisplaced PMI.  Heart regular S1/S2, no S3/S4, no murmur.  No peripheral edema.  No carotid bruit.  Normal pedal pulses.  Abdomen: Soft, nontender, no hepatosplenomegaly, no distention.  Skin: Intact without lesions or rashes.  Neurologic: Alert and oriented x 3.  Psych: Normal affect. Extremities: No clubbing or cyanosis.  HEENT: Normal.   Assessment/Plan: 1. Nonischemic cardiomyopathy: Possibly due to viral myocarditis versus HTN.  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 Echo in 4/19 showed EF 25-30%.  No CAD on 4/19 cath, but CI marginal at 2.01 with mildly elevated PCWP.  Echo in 9/21 showed EF fairly stable at 30%.  NYHA class II symptoms.  Not volume overloaded on exam.   - He saw Dr. Caryl Comes to discuss ICD placement post-syncope.  Syncope likely related to UTI rather than arrhythmia.  He decided against ICD placement.    - Continue Coreg 25 mg bid.  -  Continue spironolactone 25 mg daily.  - Continue Bidil 1 tab tid.  - Continue Entresto 97/103 bid.   - Will leave off Jardiance given UTI and syncope.  - BMET today 2. HTN: BP controlled.  3. Hyperlipidemia: Check lipids today.  4. Syncope: Unexplained, occurred while seated.  No prodrome.  He had a UTI, possible that UTI and dehydration could have played role but would expect prodrome.  Zio patch showed 4% PVCs.  - As above, saw EP and decided against ICD placement.   Followup in 4 months  Loralie Champagne 04/04/2021

## 2021-06-29 DIAGNOSIS — I77819 Aortic ectasia, unspecified site: Secondary | ICD-10-CM | POA: Diagnosis not present

## 2021-06-29 DIAGNOSIS — R972 Elevated prostate specific antigen [PSA]: Secondary | ICD-10-CM | POA: Diagnosis not present

## 2021-06-29 DIAGNOSIS — I493 Ventricular premature depolarization: Secondary | ICD-10-CM | POA: Diagnosis not present

## 2021-06-29 DIAGNOSIS — N182 Chronic kidney disease, stage 2 (mild): Secondary | ICD-10-CM | POA: Diagnosis not present

## 2021-06-29 DIAGNOSIS — I517 Cardiomegaly: Secondary | ICD-10-CM | POA: Diagnosis not present

## 2021-06-29 DIAGNOSIS — I5022 Chronic systolic (congestive) heart failure: Secondary | ICD-10-CM | POA: Diagnosis not present

## 2021-06-29 DIAGNOSIS — I2721 Secondary pulmonary arterial hypertension: Secondary | ICD-10-CM | POA: Diagnosis not present

## 2021-06-29 DIAGNOSIS — E785 Hyperlipidemia, unspecified: Secondary | ICD-10-CM | POA: Diagnosis not present

## 2021-06-29 DIAGNOSIS — D649 Anemia, unspecified: Secondary | ICD-10-CM | POA: Diagnosis not present

## 2021-06-29 DIAGNOSIS — I501 Left ventricular failure: Secondary | ICD-10-CM | POA: Diagnosis not present

## 2021-06-29 DIAGNOSIS — I429 Cardiomyopathy, unspecified: Secondary | ICD-10-CM | POA: Diagnosis not present

## 2021-07-12 ENCOUNTER — Other Ambulatory Visit (HOSPITAL_COMMUNITY): Payer: Self-pay

## 2021-07-12 MED FILL — Isosorbide Dinitrate-Hydralazine HCl Tab 20-37.5 MG: ORAL | 30 days supply | Qty: 90 | Fill #0 | Status: AC

## 2021-07-13 ENCOUNTER — Other Ambulatory Visit (HOSPITAL_COMMUNITY): Payer: Self-pay

## 2021-08-09 ENCOUNTER — Ambulatory Visit (HOSPITAL_COMMUNITY)
Admission: RE | Admit: 2021-08-09 | Discharge: 2021-08-09 | Disposition: A | Discharge status: Home / Self Care | Payer: PPO | Source: Ambulatory Visit | Attending: Cardiology | Admitting: Cardiology

## 2021-08-09 ENCOUNTER — Encounter (HOSPITAL_COMMUNITY): Payer: Self-pay | Admitting: Cardiology

## 2021-08-09 ENCOUNTER — Other Ambulatory Visit: Payer: Self-pay

## 2021-08-09 VITALS — BP 100/60 | HR 76 | Wt 251.6 lb

## 2021-08-09 DIAGNOSIS — I10 Essential (primary) hypertension: Secondary | ICD-10-CM | POA: Insufficient documentation

## 2021-08-09 DIAGNOSIS — N390 Urinary tract infection, site not specified: Secondary | ICD-10-CM | POA: Diagnosis not present

## 2021-08-09 DIAGNOSIS — I428 Other cardiomyopathies: Secondary | ICD-10-CM | POA: Diagnosis not present

## 2021-08-09 DIAGNOSIS — M25552 Pain in left hip: Secondary | ICD-10-CM | POA: Insufficient documentation

## 2021-08-09 DIAGNOSIS — Z8249 Family history of ischemic heart disease and other diseases of the circulatory system: Secondary | ICD-10-CM | POA: Diagnosis not present

## 2021-08-09 DIAGNOSIS — Z87891 Personal history of nicotine dependence: Secondary | ICD-10-CM | POA: Diagnosis not present

## 2021-08-09 DIAGNOSIS — E785 Hyperlipidemia, unspecified: Secondary | ICD-10-CM | POA: Diagnosis not present

## 2021-08-09 DIAGNOSIS — Z79899 Other long term (current) drug therapy: Secondary | ICD-10-CM | POA: Insufficient documentation

## 2021-08-09 DIAGNOSIS — I509 Heart failure, unspecified: Secondary | ICD-10-CM | POA: Insufficient documentation

## 2021-08-09 DIAGNOSIS — R2681 Unsteadiness on feet: Secondary | ICD-10-CM | POA: Insufficient documentation

## 2021-08-09 DIAGNOSIS — R262 Difficulty in walking, not elsewhere classified: Secondary | ICD-10-CM | POA: Diagnosis not present

## 2021-08-09 DIAGNOSIS — Z7982 Long term (current) use of aspirin: Secondary | ICD-10-CM | POA: Diagnosis not present

## 2021-08-09 DIAGNOSIS — R55 Syncope and collapse: Secondary | ICD-10-CM | POA: Diagnosis not present

## 2021-08-09 DIAGNOSIS — I420 Dilated cardiomyopathy: Secondary | ICD-10-CM

## 2021-08-09 LAB — BASIC METABOLIC PANEL
Anion gap: 9 (ref 5–15)
BUN: 20 mg/dL (ref 8–23)
CO2: 24 mmol/L (ref 22–32)
Calcium: 8.8 mg/dL — ABNORMAL LOW (ref 8.9–10.3)
Chloride: 104 mmol/L (ref 98–111)
Creatinine, Ser: 1.22 mg/dL (ref 0.61–1.24)
GFR, Estimated: >60 mL/min (ref >60)
Glucose, Bld: 103 mg/dL — ABNORMAL HIGH (ref 70–99)
Potassium: 3.8 mmol/L (ref 3.5–5.1)
Sodium: 137 mmol/L (ref 135–145)

## 2021-08-09 NOTE — Progress Notes (Signed)
Patient ID: Kevin Murillo, male   DOB: [birth year], 75 y.o.   MRN: [id number] PCP: Dr. Virgina Jock Cardiology: Dr. Aundra Dubin  75 y.o.with history of HTN and nonischemic cardiomyopathy (diagnosed in 2011) presents for cardiology followup.  He had an echo in 1/15, showing that EF remains 25-30% with mild LV dilation.  I had him do a cardiopulmonary exercise test in 3/15 that showed normal functional capacity.  Most recent echo in 4/19 showed EF remains 25-30%.  Repeat LHC/RHC in 4/19 showed no significant coronary disease, mildly elevated LV filling pressure, and CI 2.01.   Echo in 2/20 showed EF 30-35%, normal RV. Echo in 9/21 showed EF 30% with mild LV dilation, diffuse hypokinesis.   In 12/21, he was sitting in a restaurant eating breakfast when he passed out suddenly with no prodrome. He quickly regained consciousness. He was admitted to the hospital.  He was found to have a UTI, Jardiance was stopped.  His BP was low, ?due to urosepsis/dehydration.  He was also taken off Bidil and spironolactone. No prior dizzy spells and none since.  Zio patch in 2/22 showed 4% PVCs, short 4 beat NSVT run.  Patient saw Dr. Caryl Comes with EP, syncope possibly due to SGLT2 inhibitor-related UTI, Vania Rea has been stopped.   He return for followup of CHF.  Uses walker due to low back and hip pain for balance.  No dyspnea walking on flat ground.  Weight down 5 lbs.  No lightheadedness.  He can climb a flight of stairs without trouble.  No chest pain.  He gets out with friends for breakfast and dinner several times/week.   Labs (6/11): K 3.9, TSH normal, creatinine 1.0  Labs (7/11): HDL 60, LDL 122, K 4.4, creatinine 1.0, BNP 86, SPEP normal, transferrin saturation 24%  Labs (9/11): LDL 67, HDL 48, TGs 145, LFTs normal  Labs (10/11): K 4, creatinine 1.2  Labs (5/13): K 3.9, creatinine 1.0, LDL 66, HDL 44 Labs (7/14): K 3.9, creatinine 1.1 Labs (3/15): K 4.1, creatinine 1.1 Labs (8/15): K 4, creatinine 1.1, BNP 21 Labs  (4/19): K 4.4, creatinine 1.06 Labs (5/19): K 4.4, creatinine 1.13 Labs (8/19): K 4.2, creatinine 1.29 Labs (11/19): K 4.1, creatinine 1.09 Labs (2/21): K 4.2, creatinine 1.63 Labs (5/21): K 4.3, creatinine 1.2, LDL 47 Labs (12/21): K 4, creatinine 1.37, Hgb 12.2 Labs (2/22): K 4.2, creatinine 1.39 Labs (5/22): K 4.5, creatinine 1.25, LDL 55  Allergies (verified):  No Known Drug Allergies   Past Medical History:  1. HTN  2. Left hand neuropathy  3. Nonischemic cardiomyopathy: Echo (6/11) with EF 30-35%, posteroinferior severe hypokinesis, moderate diastolic dysfunction, mild left atrial enlargement, mild LV hypertrophy. LHC (7/11): EF 30-35% with diffuse hypokinesis, no angiographic CAD. SPEP and Fe studies unremarkable, TSH normal.  Repeat echo (1/12) with EF 45%, mild global hypokinesis, mild LVH, normal RV size and systolic function.  Echo (6/14) with EF 20-25%, diffuse hypokinesis, mild LV dilation. Echo (1/15) with mild LV dilation, EF 25-30%, diffuse hypokinesis, prominent apical trabeculation, mildly dilated RV. CPX (3/15) with VO2 max 20.1, VE/VCO2 slope 34.7 (near normal when adjusted to respiratory compensation point) => this CPX showed normal functional capacity for his age.  - Echo (4/19): EF 25-30%, mild MR.  - RHC/LHC (4/19): No significant CAD.  Mean RA 6, PA 46/18 mean 32, mean PCWP 21, CI 2.01, PVR 2.4 WU (pulmonary venous hypertension).  - Echo (2/20): EF 30-35%, moderate diastolic dysfunction, normal RV size and systolic function, moderate LAE.  -  Echo (9/21): EF 30%, mild LV dilation, diffuse hypokinesis, normal RV.  4. Hyperlipidemia  5. Syncope (12/21): No prodrome.  - Zio patch in 2/22 showed 4% PVCs, short 4 beat NSVT run.  Family History:  Mother died with CHF at 76.   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  Quit smoking in 1996.  Occasional ETOH   Married with 3 children. Wife is now in dementia unit at [facility].    ROS: All systems reviewed and negative except as per HPI.   Current Outpatient Medications  Medication Sig Dispense Refill   aspirin EC 81 MG tablet Take 81 mg by mouth daily. Swallow whole.     carvedilol (COREG) 25 MG tablet TAKE 1 TABLET BY MOUTH TWICE A DAY 180 tablet 3   isosorbide-hydrALAZINE (BIDIL) 20-37.5 MG tablet TAKE 1 TABLET BY MOUTH 3 TIMES DAILY  90 tablet 11   Multiple Vitamin (MULTIVITAMIN WITH MINERALS) TABS tablet Take 1 tablet by mouth daily.     sacubitril-valsartan (ENTRESTO) 97-103 MG TAKE 1 TABLET BY MOUTH 2 TIMES DAILY 180 tablet 3   simvastatin (ZOCOR) 40 MG tablet Take 40 mg by mouth daily.      spironolactone (ALDACTONE) 25 MG tablet Take 1 tablet (25 mg total) by mouth daily. 30 tablet 11   No current facility-administered medications for this encounter.    BP 100/60   Pulse 76   Wt 114.1 kg (251 lb 9.6 oz)   SpO2 97%   BMI 37.15 kg/m  General: NAD Neck: No JVD, no thyromegaly or thyroid nodule.  Lungs: Clear to auscultation bilaterally with normal respiratory effort. CV: Nondisplaced PMI.  Heart regular S1/S2, no S3/S4, no murmur.  No peripheral edema.  No carotid bruit.  Normal pedal pulses.  Abdomen: Soft, nontender, no hepatosplenomegaly, no distention.  Skin: Intact without lesions or rashes.  Neurologic: Alert and oriented x 3.  Psych: Normal affect. Extremities: No clubbing or cyanosis.  HEENT: Normal.   Assessment/Plan: 1. Nonischemic cardiomyopathy: Possibly due to viral myocarditis versus HTN.  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 Echo in 4/19 showed EF 25-30%.  No CAD on 4/19 cath, but CI marginal at 2.01 with mildly elevated PCWP.  Echo in 9/21 showed EF fairly stable at 30%.  NYHA class II symptoms.  Not volume overloaded on exam.   - He saw Dr. Caryl Comes to discuss ICD placement post-syncope.  Syncope likely  related to UTI rather than arrhythmia.  He decided against ICD placement.    - Continue Coreg 25 mg bid.  - Continue spironolactone 25 mg daily.  - Continue Bidil 1 tab tid, no BP room to increase.   - Continue Entresto 97/103 bid.   - Will leave off Jardiance given UTI and syncope.  - BMET today - Repeat echo at followup in 4 months.  2. HTN: BP now on the low side.  3. Hyperlipidemia: LDL ok in 5/22.  4. Syncope: Unexplained, occurred while seated.  No prodrome.  He had a UTI, possible that UTI and dehydration could have played role but would expect prodrome.  Zio patch showed 4% PVCs.  - As above, saw EP and decided against ICD placement.   Followup in 4 months with echo.   Loralie Champagne 08/09/2021

## 2021-08-09 NOTE — Patient Instructions (Signed)
Labs done today. We will contact you only if your labs are abnormal.  No medication changes were made. Please continue all current medications as prescribed.  Your physician recommends that you schedule a follow-up appointment in: 4 months with an echo prior to your exam. Please contact our office in December to schedule a January appointment.   Your physician has requested that you have an echocardiogram. Echocardiography is a painless test that uses sound waves to create images of your heart. It provides your doctor with information about the size and shape of your heart and how well your heart's chambers and valves are working. This procedure takes approximately one hour. There are no restrictions for this procedure.  If you have any questions or concerns before your next appointment please send [place] a message through [place] or call our office at [phone].    TO LEAVE A MESSAGE FOR THE NURSE SELECT OPTION 2, PLEASE LEAVE A MESSAGE INCLUDING: YOUR NAME DATE OF BIRTH CALL BACK NUMBER REASON FOR CALL**this is important as we prioritize the call backs  YOU WILL RECEIVE A CALL BACK THE SAME DAY AS LONG AS YOU CALL BEFORE 4:00 PM   Do the following things EVERYDAY: Weigh yourself in the morning before breakfast. Write it down and keep it in a log. Take your medicines as prescribed Eat low salt foods--Limit salt (sodium) to 2000 mg per day.  Stay as active as you can everyday Limit all fluids for the day to less than 2 liters   At the [facility], you and your health needs are our priority. As part of our continuing mission to provide you with exceptional heart care, we have created designated Provider Care Teams. These Care Teams include your primary Cardiologist (physician) and Advanced Practice Providers (APPs- Physician Assistants and Nurse Practitioners) who all work together to provide you with the care you need, when you need it.   You may see any of the following  providers on your designated Care Team at your next follow up: Dr Glori Bickers Dr Haynes Kerns, NP Lyda Jester, [place] Audry Riles, PharmD   Please be sure to bring in all your medications bottles to every appointment.

## 2021-08-17 ENCOUNTER — Other Ambulatory Visit: Payer: Self-pay | Admitting: Cardiology

## 2021-08-17 DIAGNOSIS — I428 Other cardiomyopathies: Secondary | ICD-10-CM

## 2021-08-23 ENCOUNTER — Other Ambulatory Visit (HOSPITAL_COMMUNITY): Payer: Self-pay

## 2021-08-23 MED FILL — Isosorbide Dinitrate-Hydralazine HCl Tab 20-37.5 MG: ORAL | 30 days supply | Qty: 90 | Fill #1 | Status: AC

## 2021-08-24 ENCOUNTER — Other Ambulatory Visit (HOSPITAL_COMMUNITY): Payer: Self-pay

## 2021-09-27 DIAGNOSIS — I130 Hypertensive heart and chronic kidney disease with heart failure and stage 1 through stage 4 chronic kidney disease, or unspecified chronic kidney disease: Secondary | ICD-10-CM | POA: Diagnosis not present

## 2021-09-27 DIAGNOSIS — I5022 Chronic systolic (congestive) heart failure: Secondary | ICD-10-CM | POA: Diagnosis not present

## 2021-09-27 DIAGNOSIS — N182 Chronic kidney disease, stage 2 (mild): Secondary | ICD-10-CM | POA: Diagnosis not present

## 2021-09-27 DIAGNOSIS — M1990 Unspecified osteoarthritis, unspecified site: Secondary | ICD-10-CM | POA: Diagnosis not present

## 2021-09-29 ENCOUNTER — Other Ambulatory Visit (HOSPITAL_COMMUNITY): Payer: Self-pay

## 2021-09-29 MED FILL — Isosorbide Dinitrate-Hydralazine HCl Tab 20-37.5 MG: ORAL | 30 days supply | Qty: 90 | Fill #2 | Status: AC

## 2021-10-27 DIAGNOSIS — N182 Chronic kidney disease, stage 2 (mild): Secondary | ICD-10-CM | POA: Diagnosis not present

## 2021-10-27 DIAGNOSIS — E785 Hyperlipidemia, unspecified: Secondary | ICD-10-CM | POA: Diagnosis not present

## 2021-10-27 DIAGNOSIS — I130 Hypertensive heart and chronic kidney disease with heart failure and stage 1 through stage 4 chronic kidney disease, or unspecified chronic kidney disease: Secondary | ICD-10-CM | POA: Diagnosis not present

## 2021-10-27 DIAGNOSIS — I5022 Chronic systolic (congestive) heart failure: Secondary | ICD-10-CM | POA: Diagnosis not present

## 2021-10-29 ENCOUNTER — Other Ambulatory Visit (HOSPITAL_COMMUNITY): Payer: Self-pay

## 2021-10-29 MED FILL — Isosorbide Dinitrate-Hydralazine HCl Tab 20-37.5 MG: ORAL | 30 days supply | Qty: 90 | Fill #3 | Status: AC

## 2021-11-26 DIAGNOSIS — I501 Left ventricular failure: Secondary | ICD-10-CM | POA: Diagnosis not present

## 2021-11-26 DIAGNOSIS — I130 Hypertensive heart and chronic kidney disease with heart failure and stage 1 through stage 4 chronic kidney disease, or unspecified chronic kidney disease: Secondary | ICD-10-CM | POA: Diagnosis not present

## 2021-11-26 DIAGNOSIS — E785 Hyperlipidemia, unspecified: Secondary | ICD-10-CM | POA: Diagnosis not present

## 2021-11-26 DIAGNOSIS — N182 Chronic kidney disease, stage 2 (mild): Secondary | ICD-10-CM | POA: Diagnosis not present

## 2021-12-13 ENCOUNTER — Other Ambulatory Visit (HOSPITAL_COMMUNITY): Payer: Self-pay

## 2021-12-13 MED FILL — Isosorbide Dinitrate-Hydralazine HCl Tab 20-37.5 MG: ORAL | 30 days supply | Qty: 90 | Fill #4 | Status: CN

## 2021-12-14 ENCOUNTER — Other Ambulatory Visit (HOSPITAL_COMMUNITY): Payer: Self-pay

## 2021-12-14 MED FILL — Isosorbide Dinitrate-Hydralazine HCl Tab 20-37.5 MG: ORAL | 30 days supply | Qty: 90 | Fill #4 | Status: CN

## 2021-12-22 ENCOUNTER — Other Ambulatory Visit (HOSPITAL_COMMUNITY): Payer: Self-pay

## 2021-12-26 DIAGNOSIS — N182 Chronic kidney disease, stage 2 (mild): Secondary | ICD-10-CM | POA: Diagnosis not present

## 2021-12-26 DIAGNOSIS — E785 Hyperlipidemia, unspecified: Secondary | ICD-10-CM | POA: Diagnosis not present

## 2021-12-26 DIAGNOSIS — I130 Hypertensive heart and chronic kidney disease with heart failure and stage 1 through stage 4 chronic kidney disease, or unspecified chronic kidney disease: Secondary | ICD-10-CM | POA: Diagnosis not present

## 2021-12-26 DIAGNOSIS — I501 Left ventricular failure: Secondary | ICD-10-CM | POA: Diagnosis not present

## 2021-12-30 DIAGNOSIS — R739 Hyperglycemia, unspecified: Secondary | ICD-10-CM | POA: Diagnosis not present

## 2021-12-30 DIAGNOSIS — I5022 Chronic systolic (congestive) heart failure: Secondary | ICD-10-CM | POA: Diagnosis not present

## 2021-12-30 DIAGNOSIS — Z125 Encounter for screening for malignant neoplasm of prostate: Secondary | ICD-10-CM | POA: Diagnosis not present

## 2021-12-30 DIAGNOSIS — E785 Hyperlipidemia, unspecified: Secondary | ICD-10-CM | POA: Diagnosis not present

## 2022-01-06 DIAGNOSIS — I493 Ventricular premature depolarization: Secondary | ICD-10-CM | POA: Diagnosis not present

## 2022-01-06 DIAGNOSIS — I130 Hypertensive heart and chronic kidney disease with heart failure and stage 1 through stage 4 chronic kidney disease, or unspecified chronic kidney disease: Secondary | ICD-10-CM | POA: Diagnosis not present

## 2022-01-06 DIAGNOSIS — D649 Anemia, unspecified: Secondary | ICD-10-CM | POA: Diagnosis not present

## 2022-01-06 DIAGNOSIS — Z0000 Encounter for general adult medical examination without abnormal findings: Secondary | ICD-10-CM | POA: Diagnosis not present

## 2022-01-06 DIAGNOSIS — R82998 Other abnormal findings in urine: Secondary | ICD-10-CM | POA: Diagnosis not present

## 2022-01-06 DIAGNOSIS — I77819 Aortic ectasia, unspecified site: Secondary | ICD-10-CM | POA: Diagnosis not present

## 2022-01-06 DIAGNOSIS — Z1331 Encounter for screening for depression: Secondary | ICD-10-CM | POA: Diagnosis not present

## 2022-01-06 DIAGNOSIS — R739 Hyperglycemia, unspecified: Secondary | ICD-10-CM | POA: Diagnosis not present

## 2022-01-06 DIAGNOSIS — N182 Chronic kidney disease, stage 2 (mild): Secondary | ICD-10-CM | POA: Diagnosis not present

## 2022-01-06 DIAGNOSIS — I5022 Chronic systolic (congestive) heart failure: Secondary | ICD-10-CM | POA: Diagnosis not present

## 2022-01-06 DIAGNOSIS — Z23 Encounter for immunization: Secondary | ICD-10-CM | POA: Diagnosis not present

## 2022-01-06 DIAGNOSIS — E785 Hyperlipidemia, unspecified: Secondary | ICD-10-CM | POA: Diagnosis not present

## 2022-01-13 ENCOUNTER — Other Ambulatory Visit (HOSPITAL_COMMUNITY): Payer: Self-pay

## 2022-01-20 ENCOUNTER — Other Ambulatory Visit (HOSPITAL_COMMUNITY): Payer: Self-pay | Admitting: Cardiology

## 2022-01-24 ENCOUNTER — Other Ambulatory Visit (HOSPITAL_COMMUNITY): Payer: Self-pay | Admitting: Cardiology

## 2022-01-25 DIAGNOSIS — I501 Left ventricular failure: Secondary | ICD-10-CM | POA: Diagnosis not present

## 2022-01-25 DIAGNOSIS — N182 Chronic kidney disease, stage 2 (mild): Secondary | ICD-10-CM | POA: Diagnosis not present

## 2022-01-25 DIAGNOSIS — I130 Hypertensive heart and chronic kidney disease with heart failure and stage 1 through stage 4 chronic kidney disease, or unspecified chronic kidney disease: Secondary | ICD-10-CM | POA: Diagnosis not present

## 2022-01-25 DIAGNOSIS — E785 Hyperlipidemia, unspecified: Secondary | ICD-10-CM | POA: Diagnosis not present

## 2022-01-26 ENCOUNTER — Other Ambulatory Visit (HOSPITAL_COMMUNITY): Payer: Self-pay | Admitting: Cardiology

## 2022-01-31 ENCOUNTER — Other Ambulatory Visit (HOSPITAL_COMMUNITY): Payer: Self-pay | Admitting: *Deleted

## 2022-01-31 MED ORDER — ISOSORB DINITRATE-HYDRALAZINE 20-37.5 MG PO TABS: 1.0000 | ORAL_TABLET | Freq: Three times a day (TID) | ORAL | 3 refills | Status: DC

## 2022-02-25 DIAGNOSIS — I501 Left ventricular failure: Secondary | ICD-10-CM | POA: Diagnosis not present

## 2022-02-25 DIAGNOSIS — N182 Chronic kidney disease, stage 2 (mild): Secondary | ICD-10-CM | POA: Diagnosis not present

## 2022-02-25 DIAGNOSIS — I130 Hypertensive heart and chronic kidney disease with heart failure and stage 1 through stage 4 chronic kidney disease, or unspecified chronic kidney disease: Secondary | ICD-10-CM | POA: Diagnosis not present

## 2022-02-25 DIAGNOSIS — E785 Hyperlipidemia, unspecified: Secondary | ICD-10-CM | POA: Diagnosis not present

## 2022-02-28 ENCOUNTER — Telehealth (HOSPITAL_COMMUNITY): Payer: Self-pay | Admitting: Pharmacy Technician

## 2022-02-28 ENCOUNTER — Other Ambulatory Visit (HOSPITAL_COMMUNITY): Payer: Self-pay | Admitting: *Deleted

## 2022-02-28 ENCOUNTER — Other Ambulatory Visit (HOSPITAL_COMMUNITY): Payer: Self-pay | Admitting: Cardiology

## 2022-02-28 MED ORDER — ENTRESTO 97-103 MG PO TABS: 1.0000 | ORAL_TABLET | Freq: Two times a day (BID) | ORAL | 11 refills | Status: DC

## 2022-02-28 NOTE — Telephone Encounter (Signed)
Patient Advocate Encounter ?  ?Received notification from [place] [employer] that prior authorization for Kevin Murillo is required. ?  ?PA submitted on CoverMyMeds ?Key  [id number] ?Status is pending ?  ?Will continue to follow. ? ?

## 2022-03-01 NOTE — Telephone Encounter (Signed)
Advanced Heart Failure Patient Advocate Encounter ? ?Prior Authorization for Kevin Murillo has been approved.   ? ?PA#  [id number] ?Effective dates: 02/28/22 through 03/01/23 ? ?Charlann Boxer, CPhT ? ? ?

## 2022-03-02 ENCOUNTER — Ambulatory Visit (HOSPITAL_COMMUNITY)
Admission: RE | Admit: 2022-03-02 | Discharge: 2022-03-02 | Disposition: A | Discharge status: Home / Self Care | Payer: PPO | Source: Ambulatory Visit | Attending: Cardiology | Admitting: Cardiology

## 2022-03-02 ENCOUNTER — Encounter (HOSPITAL_COMMUNITY): Payer: Self-pay | Admitting: Cardiology

## 2022-03-02 ENCOUNTER — Ambulatory Visit (HOSPITAL_BASED_OUTPATIENT_CLINIC_OR_DEPARTMENT_OTHER)
Admission: RE | Admit: 2022-03-02 | Discharge: 2022-03-02 | Disposition: A | Discharge status: Home / Self Care | Payer: PPO | Source: Ambulatory Visit | Attending: Cardiology | Admitting: Cardiology

## 2022-03-02 VITALS — BP 116/70 | HR 67 | Wt 249.6 lb

## 2022-03-02 DIAGNOSIS — E785 Hyperlipidemia, unspecified: Secondary | ICD-10-CM | POA: Insufficient documentation

## 2022-03-02 DIAGNOSIS — I110 Hypertensive heart disease with heart failure: Secondary | ICD-10-CM | POA: Diagnosis not present

## 2022-03-02 DIAGNOSIS — I5022 Chronic systolic (congestive) heart failure: Secondary | ICD-10-CM | POA: Diagnosis not present

## 2022-03-02 DIAGNOSIS — Z7901 Long term (current) use of anticoagulants: Secondary | ICD-10-CM | POA: Diagnosis not present

## 2022-03-02 DIAGNOSIS — I428 Other cardiomyopathies: Secondary | ICD-10-CM | POA: Diagnosis not present

## 2022-03-02 DIAGNOSIS — R55 Syncope and collapse: Secondary | ICD-10-CM | POA: Diagnosis not present

## 2022-03-02 DIAGNOSIS — I420 Dilated cardiomyopathy: Secondary | ICD-10-CM

## 2022-03-02 DIAGNOSIS — I493 Ventricular premature depolarization: Secondary | ICD-10-CM | POA: Insufficient documentation

## 2022-03-02 DIAGNOSIS — Z79899 Other long term (current) drug therapy: Secondary | ICD-10-CM | POA: Insufficient documentation

## 2022-03-02 DIAGNOSIS — I5030 Unspecified diastolic (congestive) heart failure: Secondary | ICD-10-CM | POA: Diagnosis not present

## 2022-03-02 LAB — BASIC METABOLIC PANEL
Anion gap: 7 (ref 5–15)
BUN: 25 mg/dL — ABNORMAL HIGH (ref 8–23)
CO2: 24 mmol/L (ref 22–32)
Calcium: 9.5 mg/dL (ref 8.9–10.3)
Chloride: 107 mmol/L (ref 98–111)
Creatinine, Ser: 1.35 mg/dL — ABNORMAL HIGH (ref 0.61–1.24)
GFR, Estimated: 54 mL/min — ABNORMAL LOW (ref >60)
Glucose, Bld: 105 mg/dL — ABNORMAL HIGH (ref 70–99)
Potassium: 4.4 mmol/L (ref 3.5–5.1)
Sodium: 138 mmol/L (ref 135–145)

## 2022-03-02 LAB — ECHOCARDIOGRAM COMPLETE
Area-P 1/2: 3.65 cm2
Calc EF: 38.7 %
S' Lateral: 4.2 cm
Single Plane A2C EF: 36.5 %
Single Plane A4C EF: 40 %

## 2022-03-02 NOTE — Progress Notes (Signed)
?  Echocardiogram ?2D Echocardiogram has been performed. ? ?Fidel Levy ?03/02/2022, 11:15 AM ?

## 2022-03-02 NOTE — Progress Notes (Signed)
Patient ID: Kevin Murillo, male   DOB: [birth year], 76 y.o.   MRN: [id number] ?PCP: Dr. Virgina Jock ?Cardiology: Dr. Aundra Dubin ? ?76 y.o.with history of HTN and nonischemic cardiomyopathy (diagnosed in 2011) presents for cardiology followup.  He had an echo in 1/15, showing that EF remains 25-30% with mild LV dilation.  I had him do a cardiopulmonary exercise test in 3/15 that showed normal functional capacity.  Most recent echo in 4/19 showed EF remains 25-30%.  Repeat LHC/RHC in 4/19 showed no significant coronary disease, mildly elevated LV filling pressure, and CI 2.01.  ? ?Echo in 2/20 showed EF 30-35%, normal RV. Echo in 9/21 showed EF 30% with mild LV dilation, diffuse hypokinesis.  ? ?In 12/21, he was sitting in a restaurant eating breakfast when he passed out suddenly with no prodrome. He quickly regained consciousness. He was admitted to the hospital.  He was found to have a UTI, Jardiance was stopped.  His BP was low, ?due to urosepsis/dehydration.  He was also taken off Bidil and spironolactone. No prior dizzy spells and none since.  Zio patch in 2/22 showed 4% PVCs, short 4 beat NSVT run.  Patient saw Dr. Caryl Comes with EP, syncope possibly due to SGLT2 inhibitor-related UTI, Vania Rea has been stopped.  ? ?Echo was done today and reviewed, EF 30% with global hypokinesis, mildly decreased RV systolic function.  ? ?He return for followup of CHF.  Uses walker due to low back and hip pain for balance.  No exertional dyspnea or chest pain.  No orthopnea/PND.  No lightheadedness.  Weight down 2 lbs.  ? ?Labs (6/11): K 3.9, TSH normal, creatinine 1.0  ?Labs (7/11): HDL 60, LDL 122, K 4.4, creatinine 1.0, BNP 86, SPEP normal, transferrin saturation 24%  ?Labs (9/11): LDL 67, HDL 48, TGs 145, LFTs normal  ?Labs (10/11): K 4, creatinine 1.2  ?Labs (5/13): K 3.9, creatinine 1.0, LDL 66, HDL 44 ?Labs (7/14): K 3.9, creatinine 1.1 ?Labs (3/15): K 4.1, creatinine 1.1 ?Labs (8/15): K 4, creatinine 1.1, BNP 21 ?Labs (4/19): K  4.4, creatinine 1.06 ?Labs (5/19): K 4.4, creatinine 1.13 ?Labs (8/19): K 4.2, creatinine 1.29 ?Labs (11/19): K 4.1, creatinine 1.09 ?Labs (2/21): K 4.2, creatinine 1.63 ?Labs (5/21): K 4.3, creatinine 1.2, LDL 47 ?Labs (12/21): K 4, creatinine 1.37, Hgb 12.2 ?Labs (2/22): K 4.2, creatinine 1.39 ?Labs (5/22): K 4.5, creatinine 1.25, LDL 55 ?Labs (9/22): K 3.8, creatinine 1.22 ? ?Allergies (verified):  ?No Known Drug Allergies  ? ?Past Medical History:  ?1. HTN  ?2. Left hand neuropathy  ?3. Nonischemic cardiomyopathy: Echo (6/11) with EF 30-35%, posteroinferior severe hypokinesis, moderate diastolic dysfunction, mild left atrial enlargement, mild LV hypertrophy. LHC (7/11): EF 30-35% with diffuse hypokinesis, no angiographic CAD. SPEP and Fe studies unremarkable, TSH normal.  Repeat echo (1/12) with EF 45%, mild global hypokinesis, mild LVH, normal RV size and systolic function.  Echo (6/14) with EF 20-25%, diffuse hypokinesis, mild LV dilation. Echo (1/15) with mild LV dilation, EF 25-30%, diffuse hypokinesis, prominent apical trabeculation, mildly dilated RV. CPX (3/15) with VO2 max 20.1, VE/VCO2 slope 34.7 (near normal when adjusted to respiratory compensation point) => this CPX showed normal functional capacity for his age.  ?- Echo (4/19): EF 25-30%, mild MR.  ?- RHC/LHC (4/19): No significant CAD.  Mean RA 6, PA 46/18 mean 32, mean PCWP 21, CI 2.01, PVR 2.4 WU (pulmonary venous hypertension).  ?- Echo (2/20): EF 30-35%, moderate diastolic dysfunction, normal RV size and systolic function, moderate LAE.  ?-  Echo (9/21): EF 30%, mild LV dilation, diffuse hypokinesis, normal RV.  ?- Echo (4/23): EF 30% with global hypokinesis, mildly decreased RV systolic function. ?4. Hyperlipidemia  ?5. Syncope (12/21): No prodrome.  ?- Zio patch in 2/22 showed 4% PVCs, short 4 beat NSVT run. ? ?Family History:  ?Mother died with CHF at 37.  ? 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  ?Quit smoking in 1996.  ?Occasional ETOH  ?Married with 3 children. Wife is now in dementia unit at [facility].   ? ?ROS: All systems reviewed and negative except as per HPI.  ? ?Current Outpatient Medications  ?Medication Sig Dispense Refill  ? aspirin EC 81 MG tablet Take 81 mg by mouth daily. Swallow whole.    ? carvedilol (COREG) 25 MG tablet TAKE 1 TABLET BY MOUTH TWICE A DAY 180 tablet 3  ? isosorbide-hydrALAZINE (BIDIL) 20-37.5 MG tablet TAKE 1 TABLET BY MOUTH 3 TIMES DAILY  90 tablet 11  ? Multiple Vitamin (MULTIVITAMIN WITH MINERALS) TABS tablet Take 1 tablet by mouth daily.    ? sacubitril-valsartan (ENTRESTO) 97-103 MG TAKE 1 TABLET BY MOUTH 2 TIMES DAILY 180 tablet 3  ? simvastatin (ZOCOR) 40 MG tablet Take 40 mg by mouth daily.     ? spironolactone (ALDACTONE) 25 MG tablet Take 1 tablet (25 mg total) by mouth daily. 30 tablet 11  ? ?No current facility-administered medications for this encounter.  ? ? ?BP 116/70   Pulse 67   Wt 113.2 kg (249 lb 9.6 oz)   SpO2 95%   BMI 36.86 kg/m?  ?General: NAD ?Neck: No JVD, no thyromegaly or thyroid nodule.  ?Lungs: Clear to auscultation bilaterally with normal respiratory effort. ?CV: Nondisplaced PMI.  Heart regular S1/S2, no S3/S4, no murmur.  No peripheral edema.  No carotid bruit.  Normal pedal pulses.  ?Abdomen: Soft, nontender, no hepatosplenomegaly, no distention.  ?Skin: Intact without lesions or rashes.  ?Neurologic: Alert and oriented x 3.  ?Psych: Normal affect. ?Extremities: No clubbing or cyanosis.  ?HEENT: Normal.  ? ?Assessment/Plan: ?1. Nonischemic cardiomyopathy: Possibly due to viral myocarditis versus HTN.  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 Echo in 4/19 showed EF 25-30%.  No CAD on 4/19 cath, but CI marginal at 2.01 with mildly elevated PCWP.  Echo in 9/21 showed EF fairly stable at 30%.  Echo today showed EF  30% with global hypokinesis, mildly decreased RV systolic function.NYHA class II symptoms.  Not volume overloaded on exam.   ?- He saw Dr. Caryl Comes to discuss ICD placement post-syncope.  Syncope likely related to UTI rather than arrhythmia.  He decided against ICD placement.    ?- Continue Coreg 25 mg bid.  ?- Continue spironolactone 25 mg daily.  ?- Continue Bidil 1 tab tid, no BP room to increase.   ?- Continue Entresto 97/103 bid.   ?- Will leave off Jardiance given UTI and syncope.  ?- BMET today ?2. HTN: BP controlled.  ?3. Hyperlipidemia: LDL ok in 5/22.  ?4. Syncope: Unexplained, occurred while seated.  No prodrome.  He had a UTI, possible that UTI and dehydration could have played role but would expect prodrome.  Zio patch showed 4% PVCs.  ?- As above, saw EP and decided against ICD placement.  ?5. Deconditioning: He is going to join the [employer] program at [employer].  ? ?  BMET 3 months, followup 6 months.  ? ?Loralie Champagne ?03/02/2022 ? ? ? ?

## 2022-03-02 NOTE — Patient Instructions (Signed)
Labs done today, we will call you for abnormal results ? ?Your physician recommends that you return for lab work in: 3 months ? ?Your physician recommends that you schedule a follow-up appointment in: 6 months, **PLEASE CALL OUR OFFICE IN AUGUST TO SCHEDULE THIS APPOINTMENT ? ?If you have any questions or concerns before your next appointment please send [place] a message through [employer] or call our office at [phone].   ? ?TO LEAVE A MESSAGE FOR THE NURSE SELECT OPTION 2, PLEASE LEAVE A MESSAGE INCLUDING: ?YOUR NAME ?DATE OF BIRTH ?CALL BACK NUMBER ?REASON FOR CALL**this is important as we prioritize the call backs ? ?YOU WILL RECEIVE A CALL BACK THE SAME DAY AS LONG AS YOU CALL BEFORE 4:00 PM ? ?At the [facility], you and your health needs are our priority. As part of our continuing mission to provide you with exceptional heart care, we have created designated Provider Care Teams. These Care Teams include your primary Cardiologist (physician) and Advanced Practice Providers (APPs- Physician Assistants and Nurse Practitioners) who all work together to provide you with the care you need, when you need it.  ? ?You may see any of the following providers on your designated Care Team at your next follow up: ?Dr Glori Bickers ?Dr Loralie Champagne ?Darrick Grinder, NP ?Lyda Jester, PA ?Jessica Milford,NP ?Marlyce Huge, PA ?Audry Riles, PharmD ? ? ?Please be sure to bring in all your medications bottles to every appointment.  ? ? ?

## 2022-03-27 DIAGNOSIS — N182 Chronic kidney disease, stage 2 (mild): Secondary | ICD-10-CM | POA: Diagnosis not present

## 2022-03-27 DIAGNOSIS — E785 Hyperlipidemia, unspecified: Secondary | ICD-10-CM | POA: Diagnosis not present

## 2022-03-27 DIAGNOSIS — I130 Hypertensive heart and chronic kidney disease with heart failure and stage 1 through stage 4 chronic kidney disease, or unspecified chronic kidney disease: Secondary | ICD-10-CM | POA: Diagnosis not present

## 2022-03-27 DIAGNOSIS — I501 Left ventricular failure: Secondary | ICD-10-CM | POA: Diagnosis not present

## 2022-04-27 DIAGNOSIS — I5022 Chronic systolic (congestive) heart failure: Secondary | ICD-10-CM | POA: Diagnosis not present

## 2022-04-27 DIAGNOSIS — I130 Hypertensive heart and chronic kidney disease with heart failure and stage 1 through stage 4 chronic kidney disease, or unspecified chronic kidney disease: Secondary | ICD-10-CM | POA: Diagnosis not present

## 2022-04-27 DIAGNOSIS — E785 Hyperlipidemia, unspecified: Secondary | ICD-10-CM | POA: Diagnosis not present

## 2022-05-27 DIAGNOSIS — I5022 Chronic systolic (congestive) heart failure: Secondary | ICD-10-CM | POA: Diagnosis not present

## 2022-05-27 DIAGNOSIS — I130 Hypertensive heart and chronic kidney disease with heart failure and stage 1 through stage 4 chronic kidney disease, or unspecified chronic kidney disease: Secondary | ICD-10-CM | POA: Diagnosis not present

## 2022-05-27 DIAGNOSIS — E785 Hyperlipidemia, unspecified: Secondary | ICD-10-CM | POA: Diagnosis not present

## 2022-06-01 ENCOUNTER — Ambulatory Visit (HOSPITAL_COMMUNITY)
Admission: RE | Admit: 2022-06-01 | Discharge: 2022-06-01 | Disposition: A | Discharge status: Home / Self Care | Payer: PPO | Source: Ambulatory Visit | Attending: Cardiology | Admitting: Cardiology

## 2022-06-01 DIAGNOSIS — I5022 Chronic systolic (congestive) heart failure: Secondary | ICD-10-CM | POA: Insufficient documentation

## 2022-06-01 LAB — BASIC METABOLIC PANEL
Anion gap: 12 (ref 5–15)
BUN: 22 mg/dL (ref 8–23)
CO2: 26 mmol/L (ref 22–32)
Calcium: 9.5 mg/dL (ref 8.9–10.3)
Chloride: 102 mmol/L (ref 98–111)
Creatinine, Ser: 1.41 mg/dL — ABNORMAL HIGH (ref 0.61–1.24)
GFR, Estimated: 51 mL/min — ABNORMAL LOW (ref >60)
Glucose, Bld: 129 mg/dL — ABNORMAL HIGH (ref 70–99)
Potassium: 4.1 mmol/L (ref 3.5–5.1)
Sodium: 140 mmol/L (ref 135–145)

## 2022-06-02 DIAGNOSIS — M25522 Pain in left elbow: Secondary | ICD-10-CM | POA: Diagnosis not present

## 2022-06-13 DIAGNOSIS — M7022 Olecranon bursitis, left elbow: Secondary | ICD-10-CM | POA: Diagnosis not present

## 2022-06-27 DIAGNOSIS — E785 Hyperlipidemia, unspecified: Secondary | ICD-10-CM | POA: Diagnosis not present

## 2022-06-27 DIAGNOSIS — I130 Hypertensive heart and chronic kidney disease with heart failure and stage 1 through stage 4 chronic kidney disease, or unspecified chronic kidney disease: Secondary | ICD-10-CM | POA: Diagnosis not present

## 2022-06-27 DIAGNOSIS — I5022 Chronic systolic (congestive) heart failure: Secondary | ICD-10-CM | POA: Diagnosis not present

## 2022-09-18 ENCOUNTER — Other Ambulatory Visit: Payer: Self-pay | Admitting: Cardiology

## 2022-09-18 DIAGNOSIS — I428 Other cardiomyopathies: Secondary | ICD-10-CM

## 2022-11-29 ENCOUNTER — Other Ambulatory Visit: Payer: Self-pay | Admitting: Cardiology

## 2022-11-29 DIAGNOSIS — I428 Other cardiomyopathies: Secondary | ICD-10-CM

## 2022-12-02 ENCOUNTER — Telehealth: Payer: Self-pay | Admitting: *Deleted

## 2022-12-02 DIAGNOSIS — M1612 Unilateral primary osteoarthritis, left hip: Secondary | ICD-10-CM | POA: Diagnosis not present

## 2022-12-02 DIAGNOSIS — Z96641 Presence of right artificial hip joint: Secondary | ICD-10-CM | POA: Diagnosis not present

## 2022-12-02 NOTE — Telephone Encounter (Signed)
   Name: Kevin Murillo  DOB: [birth year]  MRN: [id number]  Primary Cardiologist: Nelva Bush, MD  Chart reviewed as part of pre-operative protocol coverage. Because of Kevin Murillo's past medical history and time since last visit, he will require a follow-up in-office visit in order to better assess preoperative cardiovascular risk.  Pre-op covering staff: - Please schedule appointment with the advanced heart failure team and call patient to inform them.  He is currently followed by Dr. Aundra Dubin and was last seen 03/02/2022. If patient already had an upcoming appointment within acceptable timeframe, please add "pre-op clearance" to the appointment notes so provider is aware. - Please contact requesting surgeon's office via preferred method (i.e, phone, fax) to inform them of need for appointment prior to surgery.    Mable Fill, Marissa Nestle, NP  12/02/2022, 1:42 PM

## 2022-12-02 NOTE — Telephone Encounter (Signed)
   Pre-operative Risk Assessment    Patient Name: Kevin Murillo  DOB: Feb 28, 1946 MRN: [id number]      Request for Surgical Clearance    Procedure:   LEFT TOTAL HIP ARTHROPLASTY  Date of Surgery:  Clearance TBD                                 Surgeon:  DR. Lyla Glassing Surgeon's Group or Practice Name:  [facility] Phone number:  [phone]  Fax number:  [phone]    Type of Clearance Requested:   - Medical  - Pharmacy:  Hold Aspirin NOT INDICATED   Type of Anesthesia:  Spinal   Additional requests/questions:    Astrid Divine   12/02/2022, 12:59 PM

## 2022-12-05 NOTE — Telephone Encounter (Signed)
Kevin Murillo with surgeon office called and wanted to make sure that they are requesting ASA hold recommendations.  I assured Kevin Murillo that I will make note for Dr. Aundra Dubin, need ASA recommendations as well as cardiac clearance.

## 2022-12-05 NOTE — Telephone Encounter (Signed)
I s/w the pt and tells me that he only see's Dr. Aundra Dubin at the [facility]. I do see that Dr. Aundra Dubin is listed as primary card. Pt has appt 12/15/22 with Dr. Aundra Dubin and will see him for pre op clearance as well that day. I assured the pt that I will update the appt notes need pre op clearance. Pt thanked me for the call today and the help.

## 2022-12-15 ENCOUNTER — Ambulatory Visit (HOSPITAL_COMMUNITY)
Admission: RE | Admit: 2022-12-15 | Discharge: 2022-12-15 | Disposition: A | Discharge status: Home / Self Care | Payer: PPO | Source: Ambulatory Visit | Attending: Cardiology | Admitting: Cardiology

## 2022-12-15 ENCOUNTER — Encounter (HOSPITAL_COMMUNITY): Payer: Self-pay | Admitting: Cardiology

## 2022-12-15 VITALS — BP 100/60 | HR 74 | Wt 258.0 lb

## 2022-12-15 DIAGNOSIS — R55 Syncope and collapse: Secondary | ICD-10-CM | POA: Insufficient documentation

## 2022-12-15 DIAGNOSIS — Z79899 Other long term (current) drug therapy: Secondary | ICD-10-CM | POA: Diagnosis not present

## 2022-12-15 DIAGNOSIS — M1610 Unilateral primary osteoarthritis, unspecified hip: Secondary | ICD-10-CM | POA: Diagnosis not present

## 2022-12-15 DIAGNOSIS — I420 Dilated cardiomyopathy: Secondary | ICD-10-CM | POA: Diagnosis not present

## 2022-12-15 DIAGNOSIS — E785 Hyperlipidemia, unspecified: Secondary | ICD-10-CM | POA: Insufficient documentation

## 2022-12-15 DIAGNOSIS — I110 Hypertensive heart disease with heart failure: Secondary | ICD-10-CM | POA: Diagnosis not present

## 2022-12-15 DIAGNOSIS — I493 Ventricular premature depolarization: Secondary | ICD-10-CM | POA: Diagnosis not present

## 2022-12-15 DIAGNOSIS — I428 Other cardiomyopathies: Secondary | ICD-10-CM | POA: Insufficient documentation

## 2022-12-15 DIAGNOSIS — N390 Urinary tract infection, site not specified: Secondary | ICD-10-CM | POA: Diagnosis not present

## 2022-12-15 LAB — LIPID PANEL
Cholesterol: 121 mg/dL (ref 0–200)
HDL: 46 mg/dL (ref >40)
LDL Cholesterol: 41 mg/dL (ref 0–99)
Total CHOL/HDL Ratio: 2.6 RATIO
Triglycerides: 169 mg/dL — ABNORMAL HIGH (ref <150)
VLDL: 34 mg/dL (ref 0–40)

## 2022-12-15 LAB — BASIC METABOLIC PANEL
Anion gap: 7 (ref 5–15)
BUN: 16 mg/dL (ref 8–23)
CO2: 25 mmol/L (ref 22–32)
Calcium: 8.9 mg/dL (ref 8.9–10.3)
Chloride: 104 mmol/L (ref 98–111)
Creatinine, Ser: 1.27 mg/dL — ABNORMAL HIGH (ref 0.61–1.24)
GFR, Estimated: 58 mL/min — ABNORMAL LOW (ref >60)
Glucose, Bld: 95 mg/dL (ref 70–99)
Potassium: 3.8 mmol/L (ref 3.5–5.1)
Sodium: 136 mmol/L (ref 135–145)

## 2022-12-15 LAB — BRAIN NATRIURETIC PEPTIDE: B Natriuretic Peptide: 214.5 pg/mL — ABNORMAL HIGH (ref 0.0–100.0)

## 2022-12-15 NOTE — Patient Instructions (Signed)
There has been no changes to your medications..  Labs done today, your results will be available in MyChart, we will contact you for abnormal readings.  Your physician recommends that you schedule a follow-up appointment in: 3 months   If you have any questions or concerns before your next appointment please send us a message through mychart or call our office at [phone].    TO LEAVE A MESSAGE FOR THE NURSE SELECT OPTION 2, PLEASE LEAVE A MESSAGE INCLUDING: YOUR NAME DATE OF BIRTH CALL BACK NUMBER REASON FOR CALL**this is important as we prioritize the call backs  YOU WILL RECEIVE A CALL BACK THE SAME DAY AS LONG AS YOU CALL BEFORE 4:00 PM  At the [facility], you and your health needs are our priority. As part of our continuing mission to provide you with exceptional heart care, we have created designated Provider Care Teams. These Care Teams include your primary Cardiologist (physician) and Advanced Practice Providers (APPs- Physician Assistants and Nurse Practitioners) who all work together to provide you with the care you need, when you need it.   You may see any of the following providers on your designated Care Team at your next follow up: Dr Daniel Bensimhon Dr Dalton McLean Dr. Aditya Sabharwal Amy Clegg, NP Brittainy Simmons, PA Jessica Milford,NP Lindsay Finch, PA Alma Diaz, NP Lauren Kemp, PharmD   Please be sure to bring in all your medications bottles to every appointment.    

## 2022-12-15 NOTE — Progress Notes (Signed)
Patient ID: Kevin Murillo, male   DOB: 1946-08-12, 77 y.o.   MRN: [id number] PCP: Dr. Virgina Jock Cardiology: Dr. Aundra Dubin  77 y.o.with history of HTN and nonischemic cardiomyopathy (diagnosed in 2011) presents for cardiology followup.  He had an echo in 1/15, showing that EF remains 25-30% with mild LV dilation.  I had him do a cardiopulmonary exercise test in 3/15 that showed normal functional capacity.  Most recent echo in 4/19 showed EF remains 25-30%.  Repeat LHC/RHC in 4/19 showed no significant coronary disease, mildly elevated LV filling pressure, and CI 2.01.   Echo in 2/20 showed EF 30-35%, normal RV. Echo in 9/21 showed EF 30% with mild LV dilation, diffuse hypokinesis.   In 12/21, he was sitting in a restaurant eating breakfast when he passed out suddenly with no prodrome. He quickly regained consciousness. He was admitted to the hospital.  He was found to have a UTI, Jardiance was stopped.  His BP was low, ?due to urosepsis/dehydration.  He was also taken off Bidil and spironolactone. No prior dizzy spells and none since.  Zio patch in 2/22 showed 4% PVCs, short 4 beat NSVT run.  Patient saw Dr. Caryl Comes with EP, syncope possibly due to SGLT2 inhibitor-related UTI, Vania Rea has been stopped.   Echo in 4/23 showed EF 30% with global hypokinesis, mildly decreased RV systolic function.   He return for followup of CHF.  He is using a walker due to left hip pain which has become severe.  Unable to get much exercise and weight is up 9 lbs.  He plans to have left THR by Dr. Delfino Lovett. He had right THR in the past with no complication.  He has no chest pain.  No exertional dyspnea though not very active due to hip. No lightheadedness or palpitations, no falls.   ECG (personally reviewed): NSR, lateral/anterolateral TWIs  Labs (6/11): K 3.9, TSH normal, creatinine 1.0  Labs (7/11): HDL 60, LDL 122, K 4.4, creatinine 1.0, BNP 86, SPEP normal, transferrin saturation 24%  Labs (9/11): LDL 67, HDL 48,  TGs 145, LFTs normal  Labs (10/11): K 4, creatinine 1.2  Labs (5/13): K 3.9, creatinine 1.0, LDL 66, HDL 44 Labs (7/14): K 3.9, creatinine 1.1 Labs (3/15): K 4.1, creatinine 1.1 Labs (8/15): K 4, creatinine 1.1, BNP 21 Labs (4/19): K 4.4, creatinine 1.06 Labs (5/19): K 4.4, creatinine 1.13 Labs (8/19): K 4.2, creatinine 1.29 Labs (11/19): K 4.1, creatinine 1.09 Labs (2/21): K 4.2, creatinine 1.63 Labs (5/21): K 4.3, creatinine 1.2, LDL 47 Labs (12/21): K 4, creatinine 1.37, Hgb 12.2 Labs (2/22): K 4.2, creatinine 1.39 Labs (5/22): K 4.5, creatinine 1.25, LDL 55 Labs (9/22): K 3.8, creatinine 1.22 Labs (7/23): K 4.1, creatinine 1.41  Allergies (verified):  No Known Drug Allergies   Past Medical History:  1. HTN  2. Left hand neuropathy  3. Nonischemic cardiomyopathy: Echo (6/11) with EF 30-35%, posteroinferior severe hypokinesis, moderate diastolic dysfunction, mild left atrial enlargement, mild LV hypertrophy. LHC (7/11): EF 30-35% with diffuse hypokinesis, no angiographic CAD. SPEP and Fe studies unremarkable, TSH normal.  Repeat echo (1/12) with EF 45%, mild global hypokinesis, mild LVH, normal RV size and systolic function.  Echo (6/14) with EF 20-25%, diffuse hypokinesis, mild LV dilation. Echo (1/15) with mild LV dilation, EF 25-30%, diffuse hypokinesis, prominent apical trabeculation, mildly dilated RV. CPX (3/15) with VO2 max 20.1, VE/VCO2 slope 34.7 (near normal when adjusted to respiratory compensation point) => this CPX showed normal functional capacity for his age.  -  Echo (4/19): EF 25-30%, mild MR.  - RHC/LHC (4/19): No significant CAD.  Mean RA 6, PA 46/18 mean 32, mean PCWP 21, CI 2.01, PVR 2.4 WU (pulmonary venous hypertension).  - Echo (2/20): EF 30-35%, moderate diastolic dysfunction, normal RV size and systolic function, moderate LAE.  - Echo (9/21): EF 30%, mild LV dilation, diffuse hypokinesis, normal RV.  - Echo (4/23): EF 30% with global hypokinesis, mildly  decreased RV systolic function. 4. Hyperlipidemia  5. Syncope (12/21): No prodrome.  - Zio patch in 2/22 showed 4% PVCs, short 4 beat NSVT run. 6. OA hips: S/p right THR.   Family History:  Mother died with CHF at 12.   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  Quit smoking in 1996.  Occasional ETOH  Married with 3 children. Wife is now in dementia unit at [facility].    ROS: All systems reviewed and negative except as per HPI.   Current Outpatient Medications  Medication Sig Dispense Refill   aspirin EC 81 MG tablet Take 81 mg by mouth daily. Swallow whole.     carvedilol (COREG) 25 MG tablet TAKE 1 TABLET BY MOUTH TWICE A DAY 180 tablet 0   isosorbide-hydrALAZINE (BIDIL) 20-37.5 MG tablet Take 1 tablet by mouth 3 (three) times daily. 280 tablet 3   Multiple Vitamin (MULTIVITAMIN WITH MINERALS) TABS tablet Take 1 tablet by mouth daily.     sacubitril-valsartan (ENTRESTO) 97-103 MG Take 1 tablet by mouth 2 (two) times daily. 60 tablet 11   simvastatin (ZOCOR) 40 MG tablet Take 40 mg by mouth daily.      spironolactone (ALDACTONE) 25 MG tablet TAKE 1 TABLET (25 MG TOTAL) BY MOUTH DAILY. 90 tablet 3   No current facility-administered medications for this encounter.   BP 100/60   Pulse 74   Wt 117 kg (258 lb)   SpO2 95%   BMI 38.10 kg/m  General: NAD, obese Neck: No JVD, no thyromegaly or thyroid nodule.  Lungs: Clear to auscultation bilaterally with normal respiratory effort. CV: Nondisplaced PMI.  Heart regular S1/S2, no S3/S4, no murmur.  No peripheral edema.  No carotid bruit.  Normal pedal pulses.  Abdomen: Soft, nontender, no hepatosplenomegaly, no distention.  Skin: Intact without lesions or rashes.  Neurologic: Alert and oriented x 3.  Psych: Normal affect. Extremities: No clubbing or cyanosis.  HEENT: Normal.   Assessment/Plan: 1. Nonischemic  cardiomyopathy: Possibly due to viral myocarditis versus HTN.  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 Echo in 4/19 showed EF 25-30%.  No CAD on 4/19 cath, but CI marginal at 2.01 with mildly elevated PCWP.  Echo in 9/21 showed EF fairly stable at 30%.  Echo in 4/23 showed EF 30% with global hypokinesis, mildly decreased RV systolic function.NYHA class II symptoms.  He is not volume overloaded on exam.  - He saw Dr. Caryl Comes to discuss ICD placement post-syncopal episode.  Syncope likely related to UTI rather than arrhythmia.  He decided against ICD placement.    - Continue Coreg 25 mg bid.  - Continue spironolactone 25 mg daily.  - Continue Bidil 1 tab tid, no BP room to increase.   - Continue Entresto 97/103 bid.   - Will leave off Jardiance given UTI and syncope.  - BMET/BNP today 2. HTN: BP controlled (actually low now).  3. Hyperlipidemia: Check lipids  today.  4. Syncope: Unexplained, occurred while seated.  No prodrome.  He had a UTI, possible that UTI and dehydration could have played role but would expect prodrome.  Zio patch showed 4% PVCs.  - As above, saw EP and decided against ICD placement.  5. Deconditioning: Would like to get back to exercising at [facility] after THR.  6. Hip osteoarthritis: Patient needs left THR due to severe pain.  He had no problems with right THR.  He is stable from CHF standpoint, not volume overloaded with class II symptoms.  I think he is of reasonable risk to undergo surgery, can hold ASA prior to procedure and restart afterwards.   Followup with APP in 3 months.   Loralie Champagne 12/15/2022   BMET 3 months, followup 6 months.   Loralie Champagne 12/15/2022

## 2022-12-16 ENCOUNTER — Telehealth (HOSPITAL_COMMUNITY): Payer: Self-pay

## 2022-12-16 NOTE — Telephone Encounter (Signed)
Surgical clearance letter faxed to Dr. Delfino Lovett on 12/15/22 per Dr. Aundra Dubin

## 2023-01-13 DIAGNOSIS — E785 Hyperlipidemia, unspecified: Secondary | ICD-10-CM | POA: Diagnosis not present

## 2023-01-13 DIAGNOSIS — Z1212 Encounter for screening for malignant neoplasm of rectum: Secondary | ICD-10-CM | POA: Diagnosis not present

## 2023-01-13 DIAGNOSIS — Z125 Encounter for screening for malignant neoplasm of prostate: Secondary | ICD-10-CM | POA: Diagnosis not present

## 2023-01-13 DIAGNOSIS — R7989 Other specified abnormal findings of blood chemistry: Secondary | ICD-10-CM | POA: Diagnosis not present

## 2023-01-13 DIAGNOSIS — D649 Anemia, unspecified: Secondary | ICD-10-CM | POA: Diagnosis not present

## 2023-01-13 DIAGNOSIS — R739 Hyperglycemia, unspecified: Secondary | ICD-10-CM | POA: Diagnosis not present

## 2023-01-13 DIAGNOSIS — I130 Hypertensive heart and chronic kidney disease with heart failure and stage 1 through stage 4 chronic kidney disease, or unspecified chronic kidney disease: Secondary | ICD-10-CM | POA: Diagnosis not present

## 2023-01-14 ENCOUNTER — Other Ambulatory Visit (HOSPITAL_COMMUNITY): Payer: Self-pay | Admitting: Cardiology

## 2023-01-20 DIAGNOSIS — I501 Left ventricular failure: Secondary | ICD-10-CM | POA: Diagnosis not present

## 2023-01-20 DIAGNOSIS — I2721 Secondary pulmonary arterial hypertension: Secondary | ICD-10-CM | POA: Diagnosis not present

## 2023-01-20 DIAGNOSIS — I517 Cardiomegaly: Secondary | ICD-10-CM | POA: Diagnosis not present

## 2023-01-20 DIAGNOSIS — R82998 Other abnormal findings in urine: Secondary | ICD-10-CM | POA: Diagnosis not present

## 2023-01-20 DIAGNOSIS — I130 Hypertensive heart and chronic kidney disease with heart failure and stage 1 through stage 4 chronic kidney disease, or unspecified chronic kidney disease: Secondary | ICD-10-CM | POA: Diagnosis not present

## 2023-01-20 DIAGNOSIS — I429 Cardiomyopathy, unspecified: Secondary | ICD-10-CM | POA: Diagnosis not present

## 2023-01-20 DIAGNOSIS — I5022 Chronic systolic (congestive) heart failure: Secondary | ICD-10-CM | POA: Diagnosis not present

## 2023-01-20 DIAGNOSIS — N182 Chronic kidney disease, stage 2 (mild): Secondary | ICD-10-CM | POA: Diagnosis not present

## 2023-01-20 DIAGNOSIS — E785 Hyperlipidemia, unspecified: Secondary | ICD-10-CM | POA: Diagnosis not present

## 2023-01-20 DIAGNOSIS — Z23 Encounter for immunization: Secondary | ICD-10-CM | POA: Diagnosis not present

## 2023-01-20 DIAGNOSIS — I77819 Aortic ectasia, unspecified site: Secondary | ICD-10-CM | POA: Diagnosis not present

## 2023-01-20 DIAGNOSIS — Z0000 Encounter for general adult medical examination without abnormal findings: Secondary | ICD-10-CM | POA: Diagnosis not present

## 2023-01-20 DIAGNOSIS — T499 Poisoning by, adverse effect of and underdosing of unspecified topical agent: Secondary | ICD-10-CM | POA: Diagnosis not present

## 2023-01-23 ENCOUNTER — Ambulatory Visit: Payer: Self-pay | Admitting: Student

## 2023-01-23 NOTE — Progress Notes (Signed)
Sent message, via epic in basket, requesting orders in epic from surgeon.  

## 2023-01-24 NOTE — Progress Notes (Addendum)
Anesthesia Review:  PCP: DR Shon Baton  Clearance dated 12/16/22 on chart  Cardiologist : Loralie Champagne LOV 12/15/22  clearance on chart dated 12/15/22  Chest x-ray : EKG : 12/15/22  Echo : 03/06/22  Stress test: Cardiac Cath :  2019  Activity level: can do a flight of stairs without difficulty  Sleep Study/ CPAP : none  Fasting Blood Sugar :      / Checks Blood Sugar -- times a day:   Blood Thinner/ Instructions /Last Dose: ASA / Instructions/ Last Dose :   81 mg aspirin

## 2023-01-25 NOTE — Patient Instructions (Signed)
SURGICAL WAITING ROOM VISITATION  Patients having surgery or a procedure may have no more than 2 support people in the waiting area - these visitors may rotate.    Children under the age of 85 must have an adult with them who is not the patient.  Due to an increase in RSV and influenza rates and associated hospitalizations, children ages 44 and under may not visit patients in [place].  If the patient needs to stay at the hospital during part of their recovery, the visitor guidelines for inpatient rooms apply. Pre-op nurse will coordinate an appropriate time for 1 support person to accompany patient in pre-op.  This support person may not rotate.    Please refer to the [facility] website for the visitor guidelines for Inpatients (after your surgery is over and you are in a regular room).       Your procedure is scheduled on:  02/09/23    Report to [facility] Main Entrance    Report to admitting at  [place] AM   Call this number if you have problems the morning of surgery [phone]   Do not eat food :After Midnight.   After Midnight you may have the following liquids until __ 0430____ AM DAY OF SURGERY  Water Non-Citrus Juices (without pulp, NO RED-Apple, White grape, White cranberry) Black Coffee (NO MILK/CREAM OR CREAMERS, sugar ok)  Clear Tea (NO MILK/CREAM OR CREAMERS, sugar ok) regular and decaf                             Plain Jell-O (NO RED)                                           Fruit ices (not with fruit pulp, NO RED)                                     Popsicles (NO RED)                                                               Sports drinks like Gatorade (NO RED)              y.        The day of surgery:  Drink ONE (1) Pre-Surgery Clear Ensure or G2 at   0430 ( have completed by) AM the morning of surgery. Drink in one sitting. Do not sip.  This drink was given to you during your hospital  pre-op appointment visit. Nothing else to  drink after completing the  Pre-Surgery Clear Ensure or G2.          If you have questions, please contact your surgeon's office.       Oral Hygiene is also important to reduce your risk of infection.                                    Remember - BRUSH YOUR TEETH THE MORNING OF SURGERY  WITH YOUR REGULAR TOOTHPASTE  DENTURES WILL BE REMOVED PRIOR TO SURGERY PLEASE DO NOT APPLY "Poly grip" OR ADHESIVES!!!   Do NOT smoke after Midnight   Take these medicines the morning of surgery with A SIP OF WATER:  Coreg, Bidil   DO NOT TAKE ANY ORAL DIABETIC MEDICATIONS DAY OF YOUR SURGERY  Bring CPAP mask and tubing day of surgery.                              You may not have any metal on your body including hair pins, jewelry, and body piercing             Do not wear make-up, lotions, powders, perfumes/cologne, or deodorant  Do not wear nail polish including gel and S&S, artificial/acrylic nails, or any other type of covering on natural nails including finger and toenails. If you have artificial nails, gel coating, etc. that needs to be removed by a nail salon please have this removed prior to surgery or surgery may need to be canceled/ delayed if the surgeon/ anesthesia feels like they are unable to be safely monitored.   Do not shave  48 hours prior to surgery.               Men may shave face and neck.   Do not bring valuables to the hospital. [place].   Contacts, glasses, dentures or bridgework may not be worn into surgery.   Bring small overnight bag day of surgery.   DO NOT [place]. PHARMACY WILL DISPENSE MEDICATIONS LISTED ON YOUR MEDICATION LIST TO YOU DURING YOUR ADMISSION [place]!    Patients discharged on the day of surgery will not be allowed to drive home.  Someone NEEDS to stay with you for the first 24 hours after anesthesia.   Special Instructions: Bring a copy of your  healthcare power of attorney and living will documents the day of surgery if you haven't scanned them before.              Please read over the following fact sheets you were given: IF [place] [phone]   If you received a COVID test during your pre-op visit  it is requested that you wear a mask when out in public, stay away from anyone that may not be feeling well and notify your surgeon if you develop symptoms. If you test positive for Covid or have been in contact with anyone that has tested positive in the last 10 days please notify you surgeon.    [place] - Preparing for Surgery Before surgery, you can play an important role.  Because skin is not sterile, your skin needs to be as free of germs as possible.  You can reduce the number of germs on your skin by washing with CHG (chlorahexidine gluconate) soap before surgery.  CHG is an antiseptic cleaner which kills germs and bonds with the skin to continue killing germs even after washing. Please DO NOT use if you have an allergy to CHG or antibacterial soaps.  If your skin becomes reddened/irritated stop using the CHG and inform your nurse when you arrive at Short Stay. Do not shave (including legs and underarms) for at least 48 hours prior to the first  CHG shower.  You may shave your face/neck. Please follow these instructions carefully:  1.  Shower with CHG Soap the night before surgery and the  morning of Surgery.  2.  If you choose to wash your hair, wash your hair first as usual with your  normal  shampoo.  3.  After you shampoo, rinse your hair and body thoroughly to remove the  shampoo.                           4.  Use CHG as you would any other liquid soap.  You can apply chg directly  to the skin and wash                       Gently with a scrungie or clean washcloth.  5.  Apply the CHG Soap to your body ONLY FROM THE NECK DOWN.   Do not use on face/ open                            Wound or open sores. Avoid contact with eyes, ears mouth and genitals (private parts).                       Wash face,  Genitals (private parts) with your normal soap.             6.  Wash thoroughly, paying special attention to the area where your surgery  will be performed.  7.  Thoroughly rinse your body with warm water from the neck down.  8.  DO NOT shower/wash with your normal soap after using and rinsing off  the CHG Soap.                9.  Pat yourself dry with a clean towel.            10.  Wear clean pajamas.            11.  Place clean sheets on your bed the night of your first shower and do not  sleep with pets. Day of Surgery : Do not apply any lotions/deodorants the morning of surgery.  Please wear clean clothes to the hospital/surgery center.  FAILURE TO FOLLOW THESE INSTRUCTIONS MAY RESULT IN THE CANCELLATION OF YOUR SURGERY PATIENT SIGNATURE_________________________________  NURSE SIGNATURE__________________________________  ________________________________________________________________________

## 2023-01-30 ENCOUNTER — Encounter (HOSPITAL_COMMUNITY)
Admission: RE | Admit: 2023-01-30 | Discharge: 2023-01-30 | Disposition: A | Discharge status: Home / Self Care | Payer: PPO | Source: Ambulatory Visit | Attending: Orthopedic Surgery | Admitting: Orthopedic Surgery

## 2023-01-30 ENCOUNTER — Encounter (HOSPITAL_COMMUNITY): Payer: Self-pay

## 2023-01-30 ENCOUNTER — Other Ambulatory Visit (HOSPITAL_COMMUNITY): Payer: Self-pay

## 2023-01-30 ENCOUNTER — Telehealth (HOSPITAL_COMMUNITY): Payer: Self-pay

## 2023-01-30 ENCOUNTER — Other Ambulatory Visit: Payer: Self-pay

## 2023-01-30 VITALS — BP 132/73 | HR 66 | Temp 98.6°F | Resp 16 | Ht 67.75 in | Wt 242.0 lb

## 2023-01-30 DIAGNOSIS — Z01818 Encounter for other preprocedural examination: Secondary | ICD-10-CM | POA: Diagnosis not present

## 2023-01-30 HISTORY — DX: Unspecified osteoarthritis, unspecified site: M19.90

## 2023-01-30 LAB — CBC
HCT: 45.3 % (ref 39.0–52.0)
Hemoglobin: 14.2 g/dL (ref 13.0–17.0)
MCH: 30 pg (ref 26.0–34.0)
MCHC: 31.3 g/dL (ref 30.0–36.0)
MCV: 95.6 fL (ref 80.0–100.0)
Platelets: 230 10*3/uL (ref 150–400)
RBC: 4.74 MIL/uL (ref 4.22–5.81)
RDW: 13.2 % (ref 11.5–15.5)
WBC: 5.9 10*3/uL (ref 4.0–10.5)
nRBC: 0 % (ref 0.0–0.2)

## 2023-01-30 LAB — BASIC METABOLIC PANEL
Anion gap: 9 (ref 5–15)
BUN: 25 mg/dL — ABNORMAL HIGH (ref 8–23)
CO2: 23 mmol/L (ref 22–32)
Calcium: 9.4 mg/dL (ref 8.9–10.3)
Chloride: 106 mmol/L (ref 98–111)
Creatinine, Ser: 1.16 mg/dL (ref 0.61–1.24)
GFR, Estimated: >60 mL/min (ref >60)
Glucose, Bld: 87 mg/dL (ref 70–99)
Potassium: 4.1 mmol/L (ref 3.5–5.1)
Sodium: 138 mmol/L (ref 135–145)

## 2023-01-30 LAB — SURGICAL PCR SCREEN
MRSA, PCR: POSITIVE — AB
Staphylococcus aureus: POSITIVE — AB

## 2023-01-30 NOTE — Telephone Encounter (Signed)
Advanced Heart Failure Patient Advocate Encounter  Received notification that prior auth needs renewal for Entresto. Review of chart shows that the patient has new coverage, and prior Josem Kaufmann is not needed for this years plan ([employer])  No prior auth submitted at this time.  Clista Bernhardt, CPhT Rx Patient Advocate Phone: [phone]

## 2023-02-02 NOTE — Anesthesia Preprocedure Evaluation (Addendum)
Anesthesia Evaluation  Patient identified by MRN, date of birth, ID band Patient awake    Reviewed: Allergy & Precautions, H&P , NPO status , Patient's Chart, lab work & pertinent test results  Airway Mallampati: II  TM Distance: >3 FB Neck ROM: Full    Dental no notable dental hx.    Pulmonary neg pulmonary ROS, former smoker   Pulmonary exam normal breath sounds clear to auscultation       Cardiovascular hypertension, Pt. on medications +CHF  Normal cardiovascular exam Rhythm:Regular Rate:Normal     Neuro/Psych negative neurological ROS  negative psych ROS   GI/Hepatic negative GI ROS, Neg liver ROS,,,  Endo/Other  negative endocrine ROS    Renal/GU negative Renal ROS  negative genitourinary   Musculoskeletal  (+) Arthritis , Osteoarthritis,    Abdominal  (+) + obese  Peds negative pediatric ROS (+)  Hematology negative hematology ROS (+)   Anesthesia Other Findings   Reproductive/Obstetrics negative OB ROS                             Anesthesia Physical Anesthesia Plan  ASA: 4  Anesthesia Plan: MAC and Spinal   Post-op Pain Management: Tylenol PO (pre-op)*   Induction: Intravenous  PONV Risk Score and Plan: 1 and Ondansetron and Treatment may vary due to age or medical condition  Airway Management Planned: Simple Face Mask  Additional Equipment:   Intra-op Plan:   Post-operative Plan:   Informed Consent: I have reviewed the patients History and Physical, chart, labs and discussed the procedure including the risks, benefits and alternatives for the proposed anesthesia with the patient or authorized representative who has indicated his/her understanding and acceptance.     Dental advisory given  Plan Discussed with: CRNA  Anesthesia Plan Comments: (See PAT note 01/30/2023)       Anesthesia Quick Evaluation

## 2023-02-02 NOTE — Progress Notes (Signed)
Anesthesia Chart Review   Case: [id number] Date/Time: 02/09/23 0900   Procedure: TOTAL HIP ARTHROPLASTY ANTERIOR APPROACH (Left: Hip)   Anesthesia type: Spinal   Pre-op diagnosis: Left hip osteoarthritis   Location: [facility] 09 / [facility]   Surgeons: Rod Can, MD       DISCUSSION:76 y.o. former smoker with h/o HTN, PVC, CHF (ICD discussed in the past, declined), left hip OA scheduled for above procedure 02/09/2023 with Dr. Rod Can.   Pt last seen by cardiology 12/15/2022. Per OV note, "Patient needs left THR due to severe pain. He had no problems with right THR. He is stable from CHF standpoint, not volume overloaded with class II symptoms. I think he is of reasonable risk to undergo surgery, can hold ASA prior to procedure and restart afterwards."  Clearance from PCP on chart which states pt is optimized for procedure.   Anticipate pt can proceed with planned procedure barring acute status change.   VS: BP 132/73   Pulse 66   Temp 37 C (Oral)   Resp 16   Ht 5' 7.75" (1.721 m)   Wt 109.8 kg   SpO2 100%   BMI 37.07 kg/m   PROVIDERS: Shon Baton, MD is PCP   Cardiologist : Loralie Champagne, MD  LABS: Labs reviewed: Acceptable for surgery. (all labs ordered are listed, but only abnormal results are displayed)  Labs Reviewed  SURGICAL PCR SCREEN - Abnormal; Notable for the following components:      Result Value   MRSA, PCR POSITIVE (*)    Staphylococcus aureus POSITIVE (*)    All other components within normal limits  BASIC METABOLIC PANEL - Abnormal; Notable for the following components:   BUN 25 (*)    All other components within normal limits  CBC  TYPE AND SCREEN     IMAGES:   EKG:   CV: Echo 03/02/2022 1. Left ventricular ejection fraction, by estimation, is 30%. The left  ventricle has moderate to severely decreased function. The left ventricle  demonstrates global hypokinesis. Left ventricular diastolic parameters are  consistent with Grade I  diastolic   dysfunction (impaired relaxation).   2. Right ventricular systolic function is mildly reduced. The right  ventricular size is normal. Tricuspid regurgitation signal is inadequate  for assessing PA pressure.   3. Left atrial size was mildly dilated.   4. The mitral valve is normal in structure. Trivial mitral valve  regurgitation. No evidence of mitral stenosis.   5. The aortic valve is tricuspid. Aortic valve regurgitation is not  visualized. No aortic stenosis is present.   6. The inferior vena cava is normal in size with greater than 50%  respiratory variability, suggesting right atrial pressure of 3 mmHg.  Past Medical History:  Diagnosis Date   Arthritis    Chronic systolic CHF (congestive heart failure), NYHA class 1 (HCC)    History of echocardiogram    Echo 3/16: Mild LVH, EF 40-45%, anteroseptal akinesis, grade 1 diastolic dysfunction, moderate LAE // Echo 3/19: diff HK worse in basal and mid inf wall, mild LVH, EF 25-30, mild MR, mild LAE   Hyperlipidemia    Hypertension    Neuropathy of hand    Non-ischemic cardiomyopathy ([place])    a. Echo 6/11: 30-35% // b. LHC 7/11: EF 30-35% with diffuse hypokinesis, no angiographic CAD. SPEP and Fe studies unremarkable, TSH normal. //  c. Echo 1/12: EF 45% // d. Echo 6/14: EF 20-25%  //  e. Echo EF 25-30%, diffuse  hypokinesis, prominent apical trabeculation // f. CPX 3/15: normal fxnal capacity  // g. CPX 3/16 low norma fxnal capacity  // h. Echo 3/16: mild LVH, EF 40-45%    Obesity    PVC (premature ventricular contraction)    Rosacea    Tubular adenoma of colon 12/2010    Past Surgical History:  Procedure Laterality Date   ARM SURGERY  1967   RIGHT    COLONOSCOPY     COLONOSCOPY WITH PROPOFOL N/A 08/28/2019   Procedure: COLONOSCOPY WITH PROPOFOL;  Surgeon: Gatha Mayer, MD;  [facility];  Service: Endoscopy;  Laterality: N/A;   CYST EXCISION Left 07/29/2019   Procedure: EXCISION OF CHRONIC LEFT LOWER BACK  CYST;  Surgeon: Coralie Keens, MD;  Location: [facility];  Service: General;  Laterality: Left;   KNEE ARTHROSCOPY Left    RIGHT/LEFT HEART CATH AND CORONARY ANGIOGRAPHY N/A 03/09/2018   Procedure: RIGHT/LEFT HEART CATH AND CORONARY ANGIOGRAPHY;  Surgeon: Larey Dresser, MD;  Location: [facility];  Service: Cardiovascular;  Laterality: N/A;   TOTAL HIP ARTHROPLASTY Right 11/27/2019   Procedure: TOTAL HIP ARTHROPLASTY ANTERIOR APPROACH;  Surgeon: Rod Can, MD;  [facility];  Service: Orthopedics;  Laterality: Right;    MEDICATIONS:  aspirin EC 81 MG tablet   carvedilol (COREG) 25 MG tablet   isosorbide-hydrALAZINE (BIDIL) 20-37.5 MG tablet   Multiple Vitamin (MULTIVITAMIN WITH MINERALS) TABS tablet   sacubitril-valsartan (ENTRESTO) 97-103 MG   simvastatin (ZOCOR) 40 MG tablet   spironolactone (ALDACTONE) 25 MG tablet   No current facility-administered medications for this encounter.    Konrad Felix Ward, PA-C [facility] [phone]

## 2023-02-07 ENCOUNTER — Ambulatory Visit: Payer: Self-pay | Admitting: Student

## 2023-02-07 NOTE — H&P (Signed)
TOTAL HIP ADMISSION H&P  Patient is admitted for left total hip arthroplasty.  Subjective:  Chief Complaint: left hip pain  HPI: Kevin Murillo, 77 y.o. male, has a history of pain and functional disability in the left hip(s) due to arthritis and patient has failed non-surgical conservative treatments for greater than 12 weeks to include NSAID's and/or analgesics, flexibility and strengthening excercises, use of assistive devices, and activity modification.  Onset of symptoms was gradual starting 10 years ago with rapidlly worsening course since that time.The patient noted no past surgery on the left hip(s).  Patient currently rates pain in the left hip at 10 out of 10 with activity. Patient has night pain, worsening of pain with activity and weight bearing, trendelenberg gait, pain that interfers with activities of daily living, and pain with passive range of motion. Patient has evidence of subchondral cysts, subchondral sclerosis, periarticular osteophytes, and joint space narrowing by imaging studies. This condition presents safety issues increasing the risk of falls.  There is no current active infection.  Patient Active Problem List   Diagnosis Date Noted   Syncope and collapse 11/26/2020   Osteoarthritis of right hip 11/27/2019   Primary osteoarthritis of right hip 11/27/2019   Dilated cardiomyopathy ([place]) 05/07/2013   Personal history of colonic adenomas 11/04/2012   HYPERLIPIDEMIA-MIXED 06/16/2010   Essential hypertension 05/27/2010   LEFT HEART FAILURE 05/27/2010   Past Medical History:  Diagnosis Date   Arthritis    Chronic systolic CHF (congestive heart failure), NYHA class 1 (HCC)    History of echocardiogram    Echo 3/16: Mild LVH, EF 40-45%, anteroseptal akinesis, grade 1 diastolic dysfunction, moderate LAE // Echo 3/19: diff HK worse in basal and mid inf wall, mild LVH, EF 25-30, mild MR, mild LAE   Hyperlipidemia    Hypertension    Neuropathy of hand     Non-ischemic cardiomyopathy ([place])    a. Echo 6/11: 30-35% // b. LHC 7/11: EF 30-35% with diffuse hypokinesis, no angiographic CAD. SPEP and Fe studies unremarkable, TSH normal. //  c. Echo 1/12: EF 45% // d. Echo 6/14: EF 20-25%  //  e. Echo EF 25-30%, diffuse hypokinesis, prominent apical trabeculation // f. CPX 3/15: normal fxnal capacity  // g. CPX 3/16 low norma fxnal capacity  // h. Echo 3/16: mild LVH, EF 40-45%    Obesity    PVC (premature ventricular contraction)    Rosacea    Tubular adenoma of colon 12/2010    Past Surgical History:  Procedure Laterality Date   ARM SURGERY  1967   RIGHT    COLONOSCOPY     COLONOSCOPY WITH PROPOFOL N/A 08/28/2019   Procedure: COLONOSCOPY WITH PROPOFOL;  Surgeon: Gatha Mayer, MD;  [facility];  Service: Endoscopy;  Laterality: N/A;   CYST EXCISION Left 07/29/2019   Procedure: EXCISION OF CHRONIC LEFT LOWER BACK CYST;  Surgeon: Coralie Keens, MD;  Location: [facility];  Service: General;  Laterality: Left;   KNEE ARTHROSCOPY Left    RIGHT/LEFT HEART CATH AND CORONARY ANGIOGRAPHY N/A 03/09/2018   Procedure: RIGHT/LEFT HEART CATH AND CORONARY ANGIOGRAPHY;  Surgeon: Larey Dresser, MD;  Location: [facility];  Service: Cardiovascular;  Laterality: N/A;   TOTAL HIP ARTHROPLASTY Right 11/27/2019   Procedure: TOTAL HIP ARTHROPLASTY ANTERIOR APPROACH;  Surgeon: Rod Can, MD;  [facility];  Service: Orthopedics;  Laterality: Right;    Current Outpatient Medications  Medication Sig Dispense Refill Last Dose   aspirin EC 81 MG  tablet Take 81 mg by mouth daily. Swallow whole.      carvedilol (COREG) 25 MG tablet TAKE 1 TABLET BY MOUTH TWICE A DAY 180 tablet 0    isosorbide-hydrALAZINE (BIDIL) 20-37.5 MG tablet Take 1 tablet by mouth 3 (three) times daily. 280 tablet 3    Multiple Vitamin (MULTIVITAMIN WITH MINERALS) TABS tablet Take 1 tablet by mouth daily.      sacubitril-valsartan (ENTRESTO) 97-103 MG Take 1 tablet by mouth 2  (two) times daily. 60 tablet 11    simvastatin (ZOCOR) 40 MG tablet Take 40 mg by mouth daily.       spironolactone (ALDACTONE) 25 MG tablet TAKE 1 TABLET (25 MG TOTAL) BY MOUTH DAILY. 90 tablet 3    No current facility-administered medications for this visit.   No Known Allergies  Social History   Tobacco Use   Smoking status: Former    Types: Cigarettes    Quit date: 03/12/2002    Years since quitting: 20.9   Smokeless tobacco: Never  Substance Use Topics   Alcohol use: Yes    Alcohol/week: 2.0 standard drinks of alcohol    Types: 1 Cans of beer, 1 Standard drinks or equivalent per week    Comment: Socially    Family History  Problem Relation Age of Onset   CVA Mother    Heart attack Other    Gout Son    Colon cancer Neg Hx      Review of Systems  Musculoskeletal:  Positive for arthralgias and gait problem.  All other systems reviewed and are negative.   Objective:  Physical Exam Constitutional:      Appearance: Normal appearance.  HENT:     Head: Normocephalic and atraumatic.     Nose: Nose normal.     Mouth/Throat:     Mouth: Mucous membranes are moist.     Pharynx: Oropharynx is clear.  Eyes:     Conjunctiva/sclera: Conjunctivae normal.  Cardiovascular:     Rate and Rhythm: Normal rate and regular rhythm.     Pulses: Normal pulses.     Heart sounds: Normal heart sounds.  Pulmonary:     Effort: Pulmonary effort is normal.     Breath sounds: Normal breath sounds.  Abdominal:     General: Abdomen is flat.     Palpations: Abdomen is soft.  Genitourinary:    Comments: deferred Musculoskeletal:     Cervical back: Normal range of motion and neck supple.     Comments: Examination of the left hip reveals no skin wounds or lesions. Mild trochanteric tenderness to palpation. He has extremely restricted range of motion of the left hip. He has a significant flexion contracture. Pain with terminal flexion and rotation. Pain in the position of impingement.    Distally, there is no focal motor or sensory deficit. He has palpable pedal pulses.   He has mild symmetrical lower extremity edema. No calf tenderness to palpation.    Skin:    General: Skin is warm and dry.     Capillary Refill: Capillary refill takes less than 2 seconds.  Neurological:     General: No focal deficit present.     Mental Status: He is alert and oriented to person, place, and time.  Psychiatric:        Mood and Affect: Mood normal.        Behavior: Behavior normal.        Thought Content: Thought content normal.  Judgment: Judgment normal.     Vital signs in last 24 hours: '@VSRANGES'$ @  Labs:   Estimated body mass index is 37.07 kg/m as calculated from the following:   Height as of 01/30/23: 5' 7.75" (1.721 m).   Weight as of 01/30/23: 109.8 kg.   Imaging Review Plain radiographs demonstrate severe degenerative joint disease of the left hip(s). The bone quality appears to be adequate for age and reported activity level.      Assessment/Plan:  End stage arthritis, left hip(s)  The patient history, physical examination, clinical judgement of the provider and imaging studies are consistent with end stage degenerative joint disease of the left hip(s) and total hip arthroplasty is deemed medically necessary. The treatment options including medical management, injection therapy, arthroscopy and arthroplasty were discussed at length. The risks and benefits of total hip arthroplasty were presented and reviewed. The risks due to aseptic loosening, infection, stiffness, dislocation/subluxation,  thromboembolic complications and other imponderables were discussed.  The patient acknowledged the explanation, agreed to proceed with the plan and consent was signed. Patient is being admitted for inpatient treatment for surgery, pain control, PT, OT, prophylactic antibiotics, VTE prophylaxis, progressive ambulation and ADL's and discharge planning.The patient is planning to  be discharged home after an overnight stay with HEP.   Therapy Plans: HEP. Discussed the importance of hip abductor strengthening exercises including 150-300 standing side leg raises per day. Also, non-pounding exercises with a stationary bike/elliptical for 30 minutes 3x/week. Disposition: Home with son Planned DVT Prophylaxis: aspirin '81mg'$  BID DME needed: Has rolling walker and cane.  PCP: Cleared Cardiology: Cleared TXA: IV Allergies:  - NDKA.  Anesthesia Concerns: None. BMI: 35.7 Last HgbA1c: Not diabetic.  Other: - History of CHF.  - Aspirin '81mg'$  at baseline.  - Hydrocodone, zofran.  - 01/30/23: Hgb 14.1, K+ 4.1, Cr. 1.16. - NO NSAIDs.    Patient's anticipated LOS is less than 2 midnights, meeting these requirements: - Younger than 35 - Lives within 1 hour of care - Has a competent adult at home to recover with post-op recover - NO history of  - Chronic pain requiring opiods  - Diabetes  - Coronary Artery Disease  - Heart failure  - Heart attack  - Stroke  - DVT/VTE  - Cardiac arrhythmia  - Respiratory Failure/COPD  - Renal failure  - Anemia  - Advanced Liver disease

## 2023-02-07 NOTE — H&P (View-Only) (Signed)
TOTAL HIP ADMISSION H&P  Patient is admitted for left total hip arthroplasty.  Subjective:  Chief Complaint: left hip pain  HPI: Kevin Murillo, 77 y.o. male, has a history of pain and functional disability in the left hip(s) due to arthritis and patient has failed non-surgical conservative treatments for greater than 12 weeks to include NSAID's and/or analgesics, flexibility and strengthening excercises, use of assistive devices, and activity modification.  Onset of symptoms was gradual starting 10 years ago with rapidlly worsening course since that time.The patient noted no past surgery on the left hip(s).  Patient currently rates pain in the left hip at 10 out of 10 with activity. Patient has night pain, worsening of pain with activity and weight bearing, trendelenberg gait, pain that interfers with activities of daily living, and pain with passive range of motion. Patient has evidence of subchondral cysts, subchondral sclerosis, periarticular osteophytes, and joint space narrowing by imaging studies. This condition presents safety issues increasing the risk of falls.  There is no current active infection.  Patient Active Problem List   Diagnosis Date Noted   Syncope and collapse 11/26/2020   Osteoarthritis of right hip 11/27/2019   Primary osteoarthritis of right hip 11/27/2019   Dilated cardiomyopathy ([place]) 05/07/2013   Personal history of colonic adenomas 11/04/2012   HYPERLIPIDEMIA-MIXED 06/16/2010   Essential hypertension 05/27/2010   LEFT HEART FAILURE 05/27/2010   Past Medical History:  Diagnosis Date   Arthritis    Chronic systolic CHF (congestive heart failure), NYHA class 1 (HCC)    History of echocardiogram    Echo 3/16: Mild LVH, EF 40-45%, anteroseptal akinesis, grade 1 diastolic dysfunction, moderate LAE // Echo 3/19: diff HK worse in basal and mid inf wall, mild LVH, EF 25-30, mild MR, mild LAE   Hyperlipidemia    Hypertension    Neuropathy of hand     Non-ischemic cardiomyopathy ([place])    a. Echo 6/11: 30-35% // b. LHC 7/11: EF 30-35% with diffuse hypokinesis, no angiographic CAD. SPEP and Fe studies unremarkable, TSH normal. //  c. Echo 1/12: EF 45% // d. Echo 6/14: EF 20-25%  //  e. Echo EF 25-30%, diffuse hypokinesis, prominent apical trabeculation // f. CPX 3/15: normal fxnal capacity  // g. CPX 3/16 low norma fxnal capacity  // h. Echo 3/16: mild LVH, EF 40-45%    Obesity    PVC (premature ventricular contraction)    Rosacea    Tubular adenoma of colon 12/2010    Past Surgical History:  Procedure Laterality Date   ARM SURGERY  1967   RIGHT    COLONOSCOPY     COLONOSCOPY WITH PROPOFOL N/A 08/28/2019   Procedure: COLONOSCOPY WITH PROPOFOL;  Surgeon: Gatha Mayer, MD;  [facility];  Service: Endoscopy;  Laterality: N/A;   CYST EXCISION Left 07/29/2019   Procedure: EXCISION OF CHRONIC LEFT LOWER BACK CYST;  Surgeon: Coralie Keens, MD;  Location: [facility];  Service: General;  Laterality: Left;   KNEE ARTHROSCOPY Left    RIGHT/LEFT HEART CATH AND CORONARY ANGIOGRAPHY N/A 03/09/2018   Procedure: RIGHT/LEFT HEART CATH AND CORONARY ANGIOGRAPHY;  Surgeon: Larey Dresser, MD;  Location: [facility];  Service: Cardiovascular;  Laterality: N/A;   TOTAL HIP ARTHROPLASTY Right 11/27/2019   Procedure: TOTAL HIP ARTHROPLASTY ANTERIOR APPROACH;  Surgeon: Rod Can, MD;  [facility];  Service: Orthopedics;  Laterality: Right;    Current Outpatient Medications  Medication Sig Dispense Refill Last Dose   aspirin EC 81 MG  tablet Take 81 mg by mouth daily. Swallow whole.      carvedilol (COREG) 25 MG tablet TAKE 1 TABLET BY MOUTH TWICE A DAY 180 tablet 0    isosorbide-hydrALAZINE (BIDIL) 20-37.5 MG tablet Take 1 tablet by mouth 3 (three) times daily. 280 tablet 3    Multiple Vitamin (MULTIVITAMIN WITH MINERALS) TABS tablet Take 1 tablet by mouth daily.      sacubitril-valsartan (ENTRESTO) 97-103 MG Take 1 tablet by mouth 2  (two) times daily. 60 tablet 11    simvastatin (ZOCOR) 40 MG tablet Take 40 mg by mouth daily.       spironolactone (ALDACTONE) 25 MG tablet TAKE 1 TABLET (25 MG TOTAL) BY MOUTH DAILY. 90 tablet 3    No current facility-administered medications for this visit.   No Known Allergies  Social History   Tobacco Use   Smoking status: Former    Types: Cigarettes    Quit date: 03/12/2002    Years since quitting: 20.9   Smokeless tobacco: Never  Substance Use Topics   Alcohol use: Yes    Alcohol/week: 2.0 standard drinks of alcohol    Types: 1 Cans of beer, 1 Standard drinks or equivalent per week    Comment: Socially    Family History  Problem Relation Age of Onset   CVA Mother    Heart attack Other    Gout Son    Colon cancer Neg Hx      Review of Systems  Musculoskeletal:  Positive for arthralgias and gait problem.  All other systems reviewed and are negative.   Objective:  Physical Exam Constitutional:      Appearance: Normal appearance.  HENT:     Head: Normocephalic and atraumatic.     Nose: Nose normal.     Mouth/Throat:     Mouth: Mucous membranes are moist.     Pharynx: Oropharynx is clear.  Eyes:     Conjunctiva/sclera: Conjunctivae normal.  Cardiovascular:     Rate and Rhythm: Normal rate and regular rhythm.     Pulses: Normal pulses.     Heart sounds: Normal heart sounds.  Pulmonary:     Effort: Pulmonary effort is normal.     Breath sounds: Normal breath sounds.  Abdominal:     General: Abdomen is flat.     Palpations: Abdomen is soft.  Genitourinary:    Comments: deferred Musculoskeletal:     Cervical back: Normal range of motion and neck supple.     Comments: Examination of the left hip reveals no skin wounds or lesions. Mild trochanteric tenderness to palpation. He has extremely restricted range of motion of the left hip. He has a significant flexion contracture. Pain with terminal flexion and rotation. Pain in the position of impingement.    Distally, there is no focal motor or sensory deficit. He has palpable pedal pulses.   He has mild symmetrical lower extremity edema. No calf tenderness to palpation.    Skin:    General: Skin is warm and dry.     Capillary Refill: Capillary refill takes less than 2 seconds.  Neurological:     General: No focal deficit present.     Mental Status: He is alert and oriented to person, place, and time.  Psychiatric:        Mood and Affect: Mood normal.        Behavior: Behavior normal.        Thought Content: Thought content normal.  Judgment: Judgment normal.     Vital signs in last 24 hours: '@VSRANGES'$ @  Labs:   Estimated body mass index is 37.07 kg/m as calculated from the following:   Height as of 01/30/23: 5' 7.75" (1.721 m).   Weight as of 01/30/23: 109.8 kg.   Imaging Review Plain radiographs demonstrate severe degenerative joint disease of the left hip(s). The bone quality appears to be adequate for age and reported activity level.      Assessment/Plan:  End stage arthritis, left hip(s)  The patient history, physical examination, clinical judgement of the provider and imaging studies are consistent with end stage degenerative joint disease of the left hip(s) and total hip arthroplasty is deemed medically necessary. The treatment options including medical management, injection therapy, arthroscopy and arthroplasty were discussed at length. The risks and benefits of total hip arthroplasty were presented and reviewed. The risks due to aseptic loosening, infection, stiffness, dislocation/subluxation,  thromboembolic complications and other imponderables were discussed.  The patient acknowledged the explanation, agreed to proceed with the plan and consent was signed. Patient is being admitted for inpatient treatment for surgery, pain control, PT, OT, prophylactic antibiotics, VTE prophylaxis, progressive ambulation and ADL's and discharge planning.The patient is planning to  be discharged home after an overnight stay with HEP.   Therapy Plans: HEP. Discussed the importance of hip abductor strengthening exercises including 150-300 standing side leg raises per day. Also, non-pounding exercises with a stationary bike/elliptical for 30 minutes 3x/week. Disposition: Home with son Planned DVT Prophylaxis: aspirin '81mg'$  BID DME needed: Has rolling walker and cane.  PCP: Cleared Cardiology: Cleared TXA: IV Allergies:  - NDKA.  Anesthesia Concerns: None. BMI: 35.7 Last HgbA1c: Not diabetic.  Other: - History of CHF.  - Aspirin '81mg'$  at baseline.  - Hydrocodone, zofran.  - 01/30/23: Hgb 14.1, K+ 4.1, Cr. 1.16. - NO NSAIDs.    Patient's anticipated LOS is less than 2 midnights, meeting these requirements: - Younger than 40 - Lives within 1 hour of care - Has a competent adult at home to recover with post-op recover - NO history of  - Chronic pain requiring opiods  - Diabetes  - Coronary Artery Disease  - Heart failure  - Heart attack  - Stroke  - DVT/VTE  - Cardiac arrhythmia  - Respiratory Failure/COPD  - Renal failure  - Anemia  - Advanced Liver disease

## 2023-02-09 ENCOUNTER — Other Ambulatory Visit: Payer: Self-pay

## 2023-02-09 ENCOUNTER — Ambulatory Visit (HOSPITAL_BASED_OUTPATIENT_CLINIC_OR_DEPARTMENT_OTHER): Payer: PPO | Admitting: Anesthesiology

## 2023-02-09 ENCOUNTER — Ambulatory Visit (HOSPITAL_COMMUNITY): Payer: PPO

## 2023-02-09 ENCOUNTER — Ambulatory Visit (HOSPITAL_COMMUNITY): Payer: PPO | Admitting: Physician Assistant

## 2023-02-09 ENCOUNTER — Encounter (HOSPITAL_COMMUNITY)
Admission: RE | Disposition: A | Discharge status: Home / Self Care | Payer: Self-pay | Source: Home / Self Care | Attending: Orthopedic Surgery

## 2023-02-09 ENCOUNTER — Ambulatory Visit (HOSPITAL_COMMUNITY)
Admission: RE | Admit: 2023-02-09 | Discharge: 2023-02-10 | Disposition: A | Discharge status: Home / Self Care | Payer: PPO | Attending: Orthopedic Surgery | Admitting: Orthopedic Surgery

## 2023-02-09 ENCOUNTER — Encounter (HOSPITAL_COMMUNITY): Payer: Self-pay | Admitting: Orthopedic Surgery

## 2023-02-09 DIAGNOSIS — Z87891 Personal history of nicotine dependence: Secondary | ICD-10-CM | POA: Diagnosis not present

## 2023-02-09 DIAGNOSIS — I509 Heart failure, unspecified: Secondary | ICD-10-CM | POA: Diagnosis not present

## 2023-02-09 DIAGNOSIS — I110 Hypertensive heart disease with heart failure: Secondary | ICD-10-CM | POA: Diagnosis not present

## 2023-02-09 DIAGNOSIS — Z01818 Encounter for other preprocedural examination: Secondary | ICD-10-CM

## 2023-02-09 DIAGNOSIS — M1990 Unspecified osteoarthritis, unspecified site: Secondary | ICD-10-CM | POA: Diagnosis not present

## 2023-02-09 DIAGNOSIS — Z79899 Other long term (current) drug therapy: Secondary | ICD-10-CM | POA: Diagnosis not present

## 2023-02-09 DIAGNOSIS — M1612 Unilateral primary osteoarthritis, left hip: Secondary | ICD-10-CM

## 2023-02-09 DIAGNOSIS — Z471 Aftercare following joint replacement surgery: Secondary | ICD-10-CM | POA: Diagnosis not present

## 2023-02-09 DIAGNOSIS — E785 Hyperlipidemia, unspecified: Secondary | ICD-10-CM | POA: Insufficient documentation

## 2023-02-09 DIAGNOSIS — Z6837 Body mass index (BMI) 37.0-37.9, adult: Secondary | ICD-10-CM | POA: Insufficient documentation

## 2023-02-09 DIAGNOSIS — I5022 Chronic systolic (congestive) heart failure: Secondary | ICD-10-CM | POA: Diagnosis not present

## 2023-02-09 DIAGNOSIS — M160 Bilateral primary osteoarthritis of hip: Secondary | ICD-10-CM | POA: Diagnosis not present

## 2023-02-09 DIAGNOSIS — E669 Obesity, unspecified: Secondary | ICD-10-CM | POA: Diagnosis not present

## 2023-02-09 DIAGNOSIS — Z96642 Presence of left artificial hip joint: Secondary | ICD-10-CM

## 2023-02-09 HISTORY — PX: TOTAL HIP ARTHROPLASTY: SHX124

## 2023-02-09 LAB — TYPE AND SCREEN
ABO/RH(D): O POS
Antibody Screen: NEGATIVE

## 2023-02-09 SURGERY — ARTHROPLASTY, HIP, TOTAL, ANTERIOR APPROACH
Anesthesia: Monitor Anesthesia Care | Site: Hip | Laterality: Left

## 2023-02-09 MED ORDER — ONDANSETRON HCL 4 MG PO TABS: 4.0000 mg | ORAL_TABLET | Freq: Four times a day (QID) | ORAL | Status: DC | PRN

## 2023-02-09 MED ORDER — ISOSORB DINITRATE-HYDRALAZINE 20-37.5 MG PO TABS
1.0000 | ORAL_TABLET | Freq: Three times a day (TID) | ORAL | Status: DC
  Administered 2023-02-09 – 2023-02-10 (×2): 1 via ORAL
  Filled 2023-02-09 (×4): qty 1

## 2023-02-09 MED ORDER — SIMVASTATIN 40 MG PO TABS
40.0000 mg | ORAL_TABLET | Freq: Every day | ORAL | Status: DC
  Administered 2023-02-10: 40 mg via ORAL
  Filled 2023-02-09: qty 1

## 2023-02-09 MED ORDER — POVIDONE-IODINE 10 % EX SWAB: 2.0000 | Freq: Once | CUTANEOUS | Status: DC

## 2023-02-09 MED ORDER — SODIUM CHLORIDE 0.9 % IV SOLN: INTRAVENOUS | Status: DC

## 2023-02-09 MED ORDER — MENTHOL 3 MG MT LOZG: 1.0000 | LOZENGE | OROMUCOSAL | Status: DC | PRN

## 2023-02-09 MED ORDER — DOCUSATE SODIUM 100 MG PO CAPS
100.0000 mg | ORAL_CAPSULE | Freq: Two times a day (BID) | ORAL | Status: DC
  Administered 2023-02-09 – 2023-02-10 (×2): 100 mg via ORAL
  Filled 2023-02-09 (×2): qty 1

## 2023-02-09 MED ORDER — DEXAMETHASONE SODIUM PHOSPHATE 10 MG/ML IJ SOLN
INTRAMUSCULAR | Status: DC | PRN
  Administered 2023-02-09: 10 mg via INTRAVENOUS

## 2023-02-09 MED ORDER — METHOCARBAMOL 500 MG IVPB - SIMPLE MED: 500.0000 mg | Freq: Four times a day (QID) | INTRAVENOUS | Status: DC | PRN

## 2023-02-09 MED ORDER — VANCOMYCIN HCL IN DEXTROSE 1-5 GM/200ML-% IV SOLN
1000.0000 mg | Freq: Two times a day (BID) | INTRAVENOUS | Status: AC
  Administered 2023-02-09: 1000 mg via INTRAVENOUS
  Filled 2023-02-09: qty 200

## 2023-02-09 MED ORDER — PHENYLEPHRINE HCL-NACL 20-0.9 MG/250ML-% IV SOLN
INTRAVENOUS | Status: AC
  Filled 2023-02-09: qty 250

## 2023-02-09 MED ORDER — ALUM & MAG HYDROXIDE-SIMETH 200-200-20 MG/5ML PO SUSP: 30.0000 mL | ORAL | Status: DC | PRN

## 2023-02-09 MED ORDER — METOCLOPRAMIDE HCL 5 MG PO TABS: 5.0000 mg | ORAL_TABLET | Freq: Three times a day (TID) | ORAL | Status: DC | PRN

## 2023-02-09 MED ORDER — ADULT MULTIVITAMIN W/MINERALS CH
1.0000 | ORAL_TABLET | Freq: Every day | ORAL | Status: DC
  Administered 2023-02-10: 1 via ORAL
  Filled 2023-02-09: qty 1

## 2023-02-09 MED ORDER — TRANEXAMIC ACID-NACL 1000-0.7 MG/100ML-% IV SOLN
1000.0000 mg | INTRAVENOUS | Status: AC
  Administered 2023-02-09: 1000 mg via INTRAVENOUS
  Filled 2023-02-09: qty 100

## 2023-02-09 MED ORDER — ONDANSETRON HCL 4 MG/2ML IJ SOLN
INTRAMUSCULAR | Status: AC
  Filled 2023-02-09: qty 2

## 2023-02-09 MED ORDER — DEXAMETHASONE SODIUM PHOSPHATE 10 MG/ML IJ SOLN
INTRAMUSCULAR | Status: AC
  Filled 2023-02-09: qty 1

## 2023-02-09 MED ORDER — METHOCARBAMOL 500 MG PO TABS: 500.0000 mg | ORAL_TABLET | Freq: Four times a day (QID) | ORAL | Status: DC | PRN

## 2023-02-09 MED ORDER — OXYCODONE HCL 5 MG/5ML PO SOLN: 5.0000 mg | Freq: Once | ORAL | Status: DC | PRN

## 2023-02-09 MED ORDER — ONDANSETRON HCL 4 MG/2ML IJ SOLN: 4.0000 mg | Freq: Four times a day (QID) | INTRAMUSCULAR | Status: DC | PRN

## 2023-02-09 MED ORDER — ASPIRIN 81 MG PO CHEW
81.0000 mg | CHEWABLE_TABLET | Freq: Two times a day (BID) | ORAL | Status: DC
  Administered 2023-02-09 – 2023-02-10 (×2): 81 mg via ORAL
  Filled 2023-02-09 (×2): qty 1

## 2023-02-09 MED ORDER — MORPHINE SULFATE (PF) 2 MG/ML IV SOLN: 0.5000 mg | INTRAVENOUS | Status: DC | PRN

## 2023-02-09 MED ORDER — HYDROCODONE-ACETAMINOPHEN 7.5-325 MG PO TABS: 1.0000 | ORAL_TABLET | ORAL | Status: DC | PRN

## 2023-02-09 MED ORDER — HYDROCODONE-ACETAMINOPHEN 5-325 MG PO TABS
1.0000 | ORAL_TABLET | ORAL | Status: DC | PRN
  Administered 2023-02-10: 1 via ORAL
  Filled 2023-02-09: qty 1

## 2023-02-09 MED ORDER — SODIUM CHLORIDE (PF) 0.9 % IJ SOLN
INTRAMUSCULAR | Status: AC
  Filled 2023-02-09: qty 50

## 2023-02-09 MED ORDER — VANCOMYCIN HCL 1500 MG/300ML IV SOLN: 1500.0000 mg | Freq: Once | INTRAVENOUS | Status: DC

## 2023-02-09 MED ORDER — DIPHENHYDRAMINE HCL 12.5 MG/5ML PO ELIX: 12.5000 mg | ORAL_SOLUTION | ORAL | Status: DC | PRN

## 2023-02-09 MED ORDER — BUPIVACAINE-EPINEPHRINE (PF) 0.25% -1:200000 IJ SOLN
INTRAMUSCULAR | Status: AC
  Filled 2023-02-09: qty 30

## 2023-02-09 MED ORDER — MIDAZOLAM HCL 2 MG/2ML IJ SOLN
INTRAMUSCULAR | Status: DC | PRN
  Administered 2023-02-09: 2 mg via INTRAVENOUS

## 2023-02-09 MED ORDER — PHENYLEPHRINE 80 MCG/ML (10ML) SYRINGE FOR IV PUSH (FOR BLOOD PRESSURE SUPPORT)
PREFILLED_SYRINGE | INTRAVENOUS | Status: DC | PRN
  Administered 2023-02-09 (×3): 160 ug via INTRAVENOUS

## 2023-02-09 MED ORDER — ORAL CARE MOUTH RINSE: 15.0000 mL | Freq: Once | OROMUCOSAL | Status: AC

## 2023-02-09 MED ORDER — ISOPROPYL ALCOHOL 70 % SOLN
Status: DC | PRN
  Administered 2023-02-09: 1 via TOPICAL

## 2023-02-09 MED ORDER — MIDAZOLAM HCL 2 MG/2ML IJ SOLN
INTRAMUSCULAR | Status: AC
  Filled 2023-02-09: qty 2

## 2023-02-09 MED ORDER — WATER FOR IRRIGATION, STERILE IR SOLN
Status: DC | PRN
  Administered 2023-02-09: 2000 mL

## 2023-02-09 MED ORDER — PROMETHAZINE HCL 25 MG/ML IJ SOLN: 6.2500 mg | INTRAMUSCULAR | Status: DC | PRN

## 2023-02-09 MED ORDER — SPIRONOLACTONE 25 MG PO TABS
25.0000 mg | ORAL_TABLET | Freq: Every day | ORAL | Status: DC
  Administered 2023-02-10: 25 mg via ORAL
  Filled 2023-02-09: qty 1

## 2023-02-09 MED ORDER — SODIUM CHLORIDE 0.9 % IR SOLN
Status: DC | PRN
  Administered 2023-02-09: 3000 mL
  Administered 2023-02-09: 1000 mL

## 2023-02-09 MED ORDER — ACETAMINOPHEN 500 MG PO TABS
1000.0000 mg | ORAL_TABLET | Freq: Once | ORAL | Status: AC
  Administered 2023-02-09: 1000 mg via ORAL
  Filled 2023-02-09: qty 2

## 2023-02-09 MED ORDER — OXYCODONE HCL 5 MG PO TABS: 5.0000 mg | ORAL_TABLET | Freq: Once | ORAL | Status: DC | PRN

## 2023-02-09 MED ORDER — PROPOFOL 10 MG/ML IV BOLUS
INTRAVENOUS | Status: DC | PRN
  Administered 2023-02-09: 20 mg via INTRAVENOUS

## 2023-02-09 MED ORDER — CHLORHEXIDINE GLUCONATE 0.12 % MT SOLN
15.0000 mL | Freq: Once | OROMUCOSAL | Status: AC
  Administered 2023-02-09: 15 mL via OROMUCOSAL

## 2023-02-09 MED ORDER — PHENYLEPHRINE HCL-NACL 20-0.9 MG/250ML-% IV SOLN
INTRAVENOUS | Status: DC | PRN
  Administered 2023-02-09: 50 ug/min via INTRAVENOUS

## 2023-02-09 MED ORDER — ACETAMINOPHEN 325 MG PO TABS: 325.0000 mg | ORAL_TABLET | Freq: Four times a day (QID) | ORAL | Status: DC | PRN

## 2023-02-09 MED ORDER — VANCOMYCIN HCL 1500 MG/300ML IV SOLN
1500.0000 mg | INTRAVENOUS | Status: AC
  Administered 2023-02-09: 1500 mg via INTRAVENOUS
  Filled 2023-02-09: qty 300

## 2023-02-09 MED ORDER — SODIUM CHLORIDE (PF) 0.9 % IJ SOLN
INTRAMUSCULAR | Status: DC | PRN
  Administered 2023-02-09: 30 mL

## 2023-02-09 MED ORDER — POLYETHYLENE GLYCOL 3350 17 G PO PACK: 17.0000 g | PACK | Freq: Every day | ORAL | Status: DC | PRN

## 2023-02-09 MED ORDER — SACUBITRIL-VALSARTAN 97-103 MG PO TABS
1.0000 | ORAL_TABLET | Freq: Two times a day (BID) | ORAL | Status: DC
  Administered 2023-02-09 – 2023-02-10 (×2): 1 via ORAL
  Filled 2023-02-09 (×2): qty 1

## 2023-02-09 MED ORDER — ONDANSETRON HCL 4 MG/2ML IJ SOLN
INTRAMUSCULAR | Status: DC | PRN
  Administered 2023-02-09: 4 mg via INTRAVENOUS

## 2023-02-09 MED ORDER — KETOROLAC TROMETHAMINE 30 MG/ML IJ SOLN
INTRAMUSCULAR | Status: DC | PRN
  Administered 2023-02-09: 30 mg

## 2023-02-09 MED ORDER — CARVEDILOL 25 MG PO TABS
25.0000 mg | ORAL_TABLET | Freq: Two times a day (BID) | ORAL | Status: DC
  Administered 2023-02-09 – 2023-02-10 (×2): 25 mg via ORAL
  Filled 2023-02-09 (×2): qty 1

## 2023-02-09 MED ORDER — PROPOFOL 500 MG/50ML IV EMUL
INTRAVENOUS | Status: DC | PRN
  Administered 2023-02-09: 100 ug/kg/min via INTRAVENOUS

## 2023-02-09 MED ORDER — PROPOFOL 1000 MG/100ML IV EMUL
INTRAVENOUS | Status: AC
  Filled 2023-02-09: qty 100

## 2023-02-09 MED ORDER — BUPIVACAINE IN DEXTROSE 0.75-8.25 % IT SOLN
INTRATHECAL | Status: DC | PRN
  Administered 2023-02-09: 2 mL via INTRATHECAL

## 2023-02-09 MED ORDER — LACTATED RINGERS IV SOLN: INTRAVENOUS | Status: DC

## 2023-02-09 MED ORDER — SENNA 8.6 MG PO TABS
1.0000 | ORAL_TABLET | Freq: Two times a day (BID) | ORAL | Status: DC
  Administered 2023-02-09 – 2023-02-10 (×2): 8.6 mg via ORAL
  Filled 2023-02-09 (×2): qty 1

## 2023-02-09 MED ORDER — CEFAZOLIN SODIUM-DEXTROSE 2-4 GM/100ML-% IV SOLN
2.0000 g | INTRAVENOUS | Status: AC
  Administered 2023-02-09: 2 g via INTRAVENOUS
  Filled 2023-02-09: qty 100

## 2023-02-09 MED ORDER — GLYCOPYRROLATE 0.2 MG/ML IJ SOLN
INTRAMUSCULAR | Status: AC
  Filled 2023-02-09: qty 1

## 2023-02-09 MED ORDER — HYDROMORPHONE HCL 1 MG/ML IJ SOLN: 0.2500 mg | INTRAMUSCULAR | Status: DC | PRN

## 2023-02-09 MED ORDER — PHENOL 1.4 % MT LIQD: 1.0000 | OROMUCOSAL | Status: DC | PRN

## 2023-02-09 MED ORDER — ORAL CARE MOUTH RINSE: 15.0000 mL | OROMUCOSAL | Status: DC | PRN

## 2023-02-09 MED ORDER — BUPIVACAINE-EPINEPHRINE 0.25% -1:200000 IJ SOLN
INTRAMUSCULAR | Status: DC | PRN
  Administered 2023-02-09: 30 mL

## 2023-02-09 MED ORDER — METOCLOPRAMIDE HCL 5 MG/ML IJ SOLN: 5.0000 mg | Freq: Three times a day (TID) | INTRAMUSCULAR | Status: DC | PRN

## 2023-02-09 MED ORDER — KETOROLAC TROMETHAMINE 30 MG/ML IJ SOLN
INTRAMUSCULAR | Status: AC
  Filled 2023-02-09: qty 1

## 2023-02-09 MED ORDER — GLYCOPYRROLATE 0.2 MG/ML IJ SOLN
INTRAMUSCULAR | Status: DC | PRN
  Administered 2023-02-09: .2 mg via INTRAVENOUS

## 2023-02-09 SURGICAL SUPPLY — 57 items
ADH SKN CLS APL DERMABOND .7 (GAUZE/BANDAGES/DRESSINGS) ×2
APL PRP STRL LF DISP 70% ISPRP (MISCELLANEOUS) ×1
BAG COUNTER SPONGE SURGICOUNT (BAG) IMPLANT
BAG DECANTER FOR FLEXI CONT (MISCELLANEOUS) IMPLANT
BAG SPEC THK2 15X12 ZIP CLS (MISCELLANEOUS)
BAG SPNG CNTER NS LX DISP (BAG)
BAG ZIPLOCK 12X15 (MISCELLANEOUS) IMPLANT
CHLORAPREP W/TINT 26 (MISCELLANEOUS) ×1 IMPLANT
COVER PERINEAL POST (MISCELLANEOUS) ×1 IMPLANT
COVER SURGICAL LIGHT HANDLE (MISCELLANEOUS) ×1 IMPLANT
DERMABOND ADVANCED .7 DNX12 (GAUZE/BANDAGES/DRESSINGS) ×2 IMPLANT
DRAPE IMP U-DRAPE 54X76 (DRAPES) ×1 IMPLANT
DRAPE SHEET LG 3/4 BI-LAMINATE (DRAPES) ×3 IMPLANT
DRAPE STERI IOBAN 125X83 (DRAPES) ×1 IMPLANT
DRAPE U-SHAPE 47X51 STRL (DRAPES) ×2 IMPLANT
DRSG AQUACEL AG ADV 3.5X10 (GAUZE/BANDAGES/DRESSINGS) ×1 IMPLANT
ELECT REM PT RETURN 15FT ADLT (MISCELLANEOUS) ×1 IMPLANT
G7 VIT E NTRL LNR 36 SZG (Miscellaneous) IMPLANT
GAUZE SPONGE 4X4 12PLY STRL (GAUZE/BANDAGES/DRESSINGS) ×1 IMPLANT
GLOVE BIO SURGEON STRL SZ7 (GLOVE) ×1 IMPLANT
GLOVE BIO SURGEON STRL SZ8.5 (GLOVE) ×2 IMPLANT
GLOVE BIOGEL PI IND STRL 7.5 (GLOVE) ×1 IMPLANT
GLOVE BIOGEL PI IND STRL 8.5 (GLOVE) ×1 IMPLANT
GOWN SPEC L3 XXLG W/TWL (GOWN DISPOSABLE) ×1 IMPLANT
GOWN STRL REUS W/ TWL XL LVL3 (GOWN DISPOSABLE) ×1 IMPLANT
GOWN STRL REUS W/TWL XL LVL3 (GOWN DISPOSABLE) ×1
HANDPIECE INTERPULSE COAX TIP (DISPOSABLE) ×1
HEAD FEM -3XOFST 36XMDLR (Head) IMPLANT
HEAD MODULAR 36MM (Head) ×1 IMPLANT
HOLDER FOLEY CATH W/STRAP (MISCELLANEOUS) ×1 IMPLANT
HOOD PEEL AWAY T7 (MISCELLANEOUS) ×3 IMPLANT
KIT TURNOVER KIT A (KITS) IMPLANT
MANIFOLD NEPTUNE II (INSTRUMENTS) ×1 IMPLANT
MARKER SKIN DUAL TIP RULER LAB (MISCELLANEOUS) ×1 IMPLANT
NDL SAFETY ECLIP 18X1.5 (MISCELLANEOUS) ×1 IMPLANT
NDL SPNL 18GX3.5 QUINCKE PK (NEEDLE) ×1 IMPLANT
NEEDLE SPNL 18GX3.5 QUINCKE PK (NEEDLE) ×1 IMPLANT
PACK ANTERIOR HIP CUSTOM (KITS) ×1 IMPLANT
PENCIL SMOKE EVACUATOR (MISCELLANEOUS) IMPLANT
SAW OSC TIP CART 19.5X105X1.3 (SAW) ×1 IMPLANT
SEALER BIPOLAR AQUA 6.0 (INSTRUMENTS) ×1 IMPLANT
SET HNDPC FAN SPRY TIP SCT (DISPOSABLE) ×1 IMPLANT
SHELL ACET G7 4H 58 SZG HIP (Shell) IMPLANT
SOLUTION PRONTOSAN WOUND 350ML (IRRIGATION / IRRIGATOR) ×1 IMPLANT
SPIKE FLUID TRANSFER (MISCELLANEOUS) ×1 IMPLANT
STEM FEM CEMTL 133D 14X113X196 (Stem) IMPLANT
SUT MNCRL AB 3-0 PS2 18 (SUTURE) ×1 IMPLANT
SUT MON AB 2-0 CT1 36 (SUTURE) ×1 IMPLANT
SUT STRATAFIX PDO 1 14 VIOLET (SUTURE) ×1
SUT STRATFX PDO 1 14 VIOLET (SUTURE) ×1
SUT VIC AB 2-0 CT1 27 (SUTURE)
SUT VIC AB 2-0 CT1 TAPERPNT 27 (SUTURE) IMPLANT
SUTURE STRATFX PDO 1 14 VIOLET (SUTURE) ×1 IMPLANT
SYR 3ML LL SCALE MARK (SYRINGE) ×1 IMPLANT
TRAY FOLEY MTR SLVR 16FR STAT (SET/KITS/TRAYS/PACK) IMPLANT
TUBE SUCTION HIGH CAP CLEAR NV (SUCTIONS) ×1 IMPLANT
WATER STERILE IRR 1000ML POUR (IV SOLUTION) ×1 IMPLANT

## 2023-02-09 NOTE — Interval H&P Note (Signed)
History and Physical Interval Note:  02/09/2023 7:06 AM  Kevin Murillo  has presented today for surgery, with the diagnosis of Left hip osteoarthritis.  The various methods of treatment have been discussed with the patient and family. After consideration of risks, benefits and other options for treatment, the patient has consented to  Procedure(s): TOTAL HIP ARTHROPLASTY ANTERIOR APPROACH (Left) as a surgical intervention.  The patient's history has been reviewed, patient examined, no change in status, stable for surgery.  I have reviewed the patient's chart and labs.  Questions were answered to the patient's satisfaction.    The risks, benefits, and alternatives were discussed with the patient. There are risks associated with the surgery including, but not limited to, problems with anesthesia (death), infection, instability (giving out of the joint), dislocation, differences in leg length/angulation/rotation, fracture of bones, loosening or failure of implants, hematoma (blood accumulation) which may require surgical drainage, blood clots, pulmonary embolism, nerve injury (foot drop and lateral thigh numbness), and blood vessel injury. The patient understands these risks and elects to proceed.    Kevin Murillo

## 2023-02-09 NOTE — Anesthesia Postprocedure Evaluation (Signed)
Anesthesia Post Note  Patient: Kevin Murillo  Procedure(s) Performed: TOTAL HIP ARTHROPLASTY ANTERIOR APPROACH (Left: Hip)     Patient location during evaluation: PACU Anesthesia Type: MAC Level of consciousness: awake and alert Pain management: pain level controlled Vital Signs Assessment: post-procedure vital signs reviewed and stable Respiratory status: spontaneous breathing, nonlabored ventilation and respiratory function stable Cardiovascular status: blood pressure returned to baseline and stable Postop Assessment: no apparent nausea or vomiting Anesthetic complications: no   No notable events documented.  Last Vitals:  Vitals:   02/09/23 1300 02/09/23 1315  BP: 100/71 116/82  Pulse: (!) 58 60  Resp: 16 14  Temp:    SpO2: 94%     Last Pain:  Vitals:   02/09/23 1315  TempSrc:   PainSc: 0-No pain                 Lynda Rainwater

## 2023-02-09 NOTE — Evaluation (Signed)
Physical Therapy Evaluation Patient Details Name: Kevin Murillo MRN: [id number] DOB: Apr 17, 1946 Today's Date: 02/09/2023  History of Present Illness  Pt s/p L THR and with hx of R THR, CHF, and non-ishemic cardiomyopathy  Clinical Impression  Pt s/p L THR and presents with functional mobility limitations 2* decreased L LE strength/ROM and post op pain.  Pt motivated and should progress well to dc to son's home with family assist.     Recommendations for follow up therapy are one component of a multi-disciplinary discharge planning process, led by the attending physician.  Recommendations may be updated based on patient status, additional functional criteria and insurance authorization.  Follow Up Recommendations Follow physician's recommendations for discharge plan and follow up therapies      Assistance Recommended at Discharge Intermittent Supervision/Assistance  Patient can return home with the following  A little help with walking and/or transfers;A little help with bathing/dressing/bathroom;Assistance with cooking/housework;Assist for transportation;Help with stairs or ramp for entrance    Equipment Recommendations None recommended by PT  Recommendations for Other Services       Functional Status Assessment Patient has had a recent decline in their functional status and demonstrates the ability to make significant improvements in function in a reasonable and predictable amount of time.     Precautions / Restrictions Precautions Precautions: Fall Restrictions Weight Bearing Restrictions: No Other Position/Activity Restrictions: WBAT      Mobility  Bed Mobility Overal bed mobility: Needs Assistance Bed Mobility: Supine to Sit     Supine to sit: Min guard     General bed mobility comments: Increased time with min guard for safety only    Transfers Overall transfer level: Needs assistance Equipment used: Rolling walker (2 wheels) Transfers: Sit to/from  Stand Sit to Stand: Min guard           General transfer comment: cues for LE management and use of UEs to self assist    Ambulation/Gait Ambulation/Gait assistance: Min assist, Min guard Gait Distance (Feet): 100 Feet Assistive device: Rolling walker (2 wheels) Gait Pattern/deviations: Step-to pattern, Step-through pattern, Shuffle, Trunk flexed       General Gait Details: cues for sequence, posture and initial sequence  Stairs            Wheelchair Mobility    Modified Rankin (Stroke Patients Only)       Balance Overall balance assessment: Mild deficits observed, not formally tested                                           Pertinent Vitals/Pain Pain Assessment Pain Assessment: 0-10 Pain Score: 2  Pain Location: L hip Pain Descriptors / Indicators: Aching, Sore Pain Intervention(s): Limited activity within patient's tolerance, Monitored during session, Premedicated before session, Ice applied    Home Living Family/patient expects to be discharged to:: Private residence Living Arrangements: Alone Available Help at Discharge: Family;Available 24 hours/day Type of Home: House Home Access: Stairs to enter Entrance Stairs-Rails: Right;Left Entrance Stairs-Number of Steps: 3 Alternate Level Stairs-Number of Steps: 6 Home Layout: Two level Home Equipment: Conservation officer, nature (2 wheels);Shower seat;Cane - single point Additional Comments: Pt will be staying at son's home initially    Prior Function Prior Level of Function : Independent/Modified Independent                     Hand Dominance  Extremity/Trunk Assessment   Upper Extremity Assessment Upper Extremity Assessment: Overall WFL for tasks assessed    Lower Extremity Assessment Lower Extremity Assessment: LLE deficits/detail    Cervical / Trunk Assessment Cervical / Trunk Assessment: Normal  Communication   Communication: No difficulties  Cognition  Arousal/Alertness: Awake/alert Behavior During Therapy: WFL for tasks assessed/performed Overall Cognitive Status: Within Functional Limits for tasks assessed                                          General Comments      Exercises Total Joint Exercises Ankle Circles/Pumps: AROM, Both, 15 reps, Supine   Assessment/Plan    PT Assessment Patient needs continued PT services  PT Problem List Decreased strength;Decreased range of motion;Decreased activity tolerance;Decreased balance;Decreased mobility;Decreased knowledge of use of DME;Pain       PT Treatment Interventions DME instruction;Gait training;Stair training;Functional mobility training;Therapeutic activities;Therapeutic exercise;Patient/family education    PT Goals (Current goals can be found in the Care Plan section)  Acute Rehab PT Goals Patient Stated Goal: REgain IND PT Goal Formulation: With patient Time For Goal Achievement: 02/16/23 Potential to Achieve Goals: Good    Frequency 7X/week     Co-evaluation               AM-PAC PT "6 Clicks" Mobility  Outcome Measure Help needed turning from your back to your side while in a flat bed without using bedrails?: A Little Help needed moving from lying on your back to sitting on the side of a flat bed without using bedrails?: A Little Help needed moving to and from a bed to a chair (including a wheelchair)?: A Little Help needed standing up from a chair using your arms (e.g., wheelchair or bedside chair)?: A Little Help needed to walk in hospital room?: A Little Help needed climbing 3-5 steps with a railing? : A Lot 6 Click Score: 17    End of Session Equipment Utilized During Treatment: Gait belt Activity Tolerance: Patient tolerated treatment well Patient left: in chair;with call bell/phone within reach;with chair alarm set;with family/visitor present Nurse Communication: Mobility status PT Visit Diagnosis: Difficulty in walking, not  elsewhere classified (R26.2)    Time: [id number] PT Time Calculation (min) (ACUTE ONLY): 22 min   Charges:   PT Evaluation $PT Eval Low Complexity: 1 Low          [place] Pager [phone] Office [phone]   Joeziah Voit 02/09/2023, 4:59 PM

## 2023-02-09 NOTE — Care Plan (Signed)
Ortho Bundle Case Management Note  Patient Details  Name: Kevin Murillo MRN: [id number] Date of Birth: [birth year]                  L THA on 02/09/23.  DCP: Home with son.  DME: No needs. Has RW & cane.  PT: HEP   DME Arranged:  N/A DME Agency:      Additional Comments: Please contact me with any questions of if this plan should need to change.    Dario Ave, Case Manager  [facility]  [phone] 02/09/2023, 12:10 PM

## 2023-02-09 NOTE — Transfer of Care (Signed)
Immediate Anesthesia Transfer of Care Note  Patient: KASHMIR SONDEREGGER  Procedure(s) Performed: TOTAL HIP ARTHROPLASTY ANTERIOR APPROACH (Left: Hip)  Patient Location: PACU  Anesthesia Type:MAC and Spinal  Level of Consciousness: awake, alert , oriented, and patient cooperative  Airway & Oxygen Therapy: Patient Spontanous Breathing and Patient connected to face mask oxygen  Post-op Assessment: Report given to RN and Post -op Vital signs reviewed and stable  Post vital signs: Reviewed and stable  Last Vitals:  Vitals Value Taken Time  BP 78/55 02/09/23 1145  Temp    Pulse 59 02/09/23 1146  Resp 12 02/09/23 1146  SpO2 100 % 02/09/23 1146  Vitals shown include unvalidated device data.  Last Pain:  Vitals:   02/09/23 0656  TempSrc: Oral         Complications: No notable events documented.

## 2023-02-09 NOTE — Anesthesia Procedure Notes (Signed)
Spinal  Patient location during procedure: OR Start time: 02/09/2023 9:15 AM End time: 02/09/2023 9:19 AM Reason for block: surgical anesthesia Staffing Performed: resident/CRNA  Anesthesiologist: Lynda Rainwater, MD Resident/CRNA: Lind Covert, CRNA Performed by: Lind Covert, CRNA Authorized by: Lynda Rainwater, MD   Preanesthetic Checklist Completed: patient identified, IV checked, site marked, risks and benefits discussed, surgical consent, monitors and equipment checked, pre-op evaluation and timeout performed Spinal Block Patient position: sitting Prep: DuraPrep Patient monitoring: heart rate, cardiac monitor, continuous pulse ox and blood pressure Approach: midline Location: L3-4 Injection technique: single-shot Needle Needle type: Pencan  Needle gauge: 24 G Needle length: 12.7 cm Needle insertion depth: 10 cm Assessment Sensory level: T6 Events: CSF return Additional Notes Patient in sitting position. L3-4 identified. Cleansed with Duraprep. SAB without difficulty. To supine position.

## 2023-02-09 NOTE — TOC Transition Note (Signed)
Transition of Care [facility]) - CM/SW Discharge Note   Patient Details  Name: Kevin Murillo MRN: [id number] Date of Birth: [birth year]  [facility] CM/SW Contact:  Lennart Pall, LCSW Phone Number: 02/09/2023, 2:01 PM   Clinical Narrative:     Met with pt and confirming he has needed DME at home.  Plan for HEP. No TOC needs.  Final next level of care: Home/Self Care Barriers to Discharge: No Barriers Identified   Patient Goals and CMS Choice      Discharge Placement                         Discharge Plan and Services Additional resources added to the After Visit Summary for                  DME Arranged: N/A                    Social Determinants of Health (SDOH) Interventions SDOH Screenings   Depression (PHQ2-9): Low Risk  (09/21/2019)  Tobacco Use: Medium Risk (02/09/2023)     Readmission Risk Interventions     No data to display

## 2023-02-09 NOTE — Op Note (Signed)
OPERATIVE REPORT  SURGEON: Rod Can, MD   ASSISTANT: Larene Pickett, PA-C.  PREOPERATIVE DIAGNOSIS: Left hip arthritis.   POSTOPERATIVE DIAGNOSIS: Left hip arthritis.   PROCEDURE: Left total hip arthroplasty, anterior approach.   IMPLANTS: Biomet Taperloc Complete Microplasty stem, size 14 x 128m, high offset. Biomet G7 OsseoTi Cup, size 58 mm. Biomet Vivacit-E liner, size 36 mm, G, neutral. Biomet Biolox ceramic head ball, size 36 - 3 mm.  ANESTHESIA:  MAC and Spinal  ESTIMATED BLOOD LOSS:-400 mL    ANTIBIOTICS: 1g vancomycin.  DRAINS: None.  COMPLICATIONS: None.   CONDITION: PACU - hemodynamically stable.   BRIEF CLINICAL NOTE: Kevin STJEANis a 77y.o. male with a long-standing history of Left hip arthritis. After failing conservative management, the patient was indicated for total hip arthroplasty. The risks, benefits, and alternatives to the procedure were explained, and the patient elected to proceed.  PROCEDURE IN DETAIL: Surgical site was marked by myself in the pre-op holding area. Once inside the operating room, spinal anesthesia was obtained, and a foley catheter was inserted. The patient was then positioned on the Hana table.  All bony prominences were well padded.  The hip was prepped and draped in the normal sterile surgical fashion.  A time-out was called verifying side and site of surgery. The patient received IV antibiotics within 60 minutes of beginning the procedure.   Bikini incision was made, and superficial dissection was performed lateral to the ASIS. The direct anterior approach to the hip was performed through the Hueter interval.  Lateral femoral circumflex vessels were treated with the Auqumantys. The anterior capsule was exposed and an inverted T capsulotomy was made. The femoral neck cut was made to the level of the templated cut.  A corkscrew was placed into the head and the head was removed.  The femoral head was found to have  eburnated bone. The head was passed to the back table and was measured. Pubofemoral ligament was released off of the calcar, taking care to stay on bone. Superior capsule was released from the greater trochanter, taking care to stay lateral to the posterior border of the femoral neck in order to preserve the short external rotators.   Acetabular exposure was achieved, and the pulvinar and labrum were excised. Sequential reaming of the acetabulum was then performed up to a size 57 mm reamer. A 58 mm cup was then opened and impacted into place at approximately 40 degrees of abduction and 20 degrees of anteversion. The final polyethylene liner was impacted into place and acetabular osteophytes were removed.    I then gained femoral exposure taking care to protect the abductors and greater trochanter.  This was performed using standard external rotation, extension, and adduction.  A cookie cutter was used to enter the femoral canal, and then the femoral canal finder was placed.  Sequential broaching was performed up to a size 14.  Calcar planer was used on the femoral neck remnant.  I placed a high offset neck and a trial head ball.  The hip was reduced.  Leg lengths and offset were checked fluoroscopically.  The hip was dislocated and trial components were removed.  The final implants were placed, and the hip was reduced.  Fluoroscopy was used to confirm component position and leg lengths.  At 90 degrees of external rotation and full extension, the hip was stable to an anterior directed force.   The wound was copiously irrigated with Prontosan solution and normal saline using pulse lavage.  Marcaine  solution was injected into the periarticular soft tissue.  The wound was closed in layers using #1 Vicryl and V-Loc for the fascia, 2-0 Vicryl for the subcutaneous fat, 2-0 Monocryl for the deep dermal layer, 3-0 running Monocryl subcuticular stitch, and Dermabond for the skin.  Once the glue was fully dried, an  Aquacell Ag dressing was applied.  The patient was transported to the recovery room in stable condition.  Sponge, needle, and instrument counts were correct at the end of the case x2.  The patient tolerated the procedure well and there were no known complications.  Please note that a surgical assistant was a medical necessity for this procedure to perform it in a safe and expeditious manner. Assistant was necessary to provide appropriate retraction of vital neurovascular structures, to prevent femoral fracture, and to allow for anatomic placement of the prosthesis.

## 2023-02-10 ENCOUNTER — Encounter (HOSPITAL_COMMUNITY): Payer: Self-pay | Admitting: Orthopedic Surgery

## 2023-02-10 DIAGNOSIS — M160 Bilateral primary osteoarthritis of hip: Secondary | ICD-10-CM | POA: Diagnosis not present

## 2023-02-10 LAB — CBC
HCT: 37.7 % — ABNORMAL LOW (ref 39.0–52.0)
Hemoglobin: 11.9 g/dL — ABNORMAL LOW (ref 13.0–17.0)
MCH: 30.5 pg (ref 26.0–34.0)
MCHC: 31.6 g/dL (ref 30.0–36.0)
MCV: 96.7 fL (ref 80.0–100.0)
Platelets: 169 10*3/uL (ref 150–400)
RBC: 3.9 MIL/uL — ABNORMAL LOW (ref 4.22–5.81)
RDW: 12.9 % (ref 11.5–15.5)
WBC: 11.1 10*3/uL — ABNORMAL HIGH (ref 4.0–10.5)
nRBC: 0 % (ref 0.0–0.2)

## 2023-02-10 LAB — BASIC METABOLIC PANEL
Anion gap: 8 (ref 5–15)
BUN: 23 mg/dL (ref 8–23)
CO2: 21 mmol/L — ABNORMAL LOW (ref 22–32)
Calcium: 8.4 mg/dL — ABNORMAL LOW (ref 8.9–10.3)
Chloride: 107 mmol/L (ref 98–111)
Creatinine, Ser: 1.1 mg/dL (ref 0.61–1.24)
GFR, Estimated: >60 mL/min (ref >60)
Glucose, Bld: 114 mg/dL — ABNORMAL HIGH (ref 70–99)
Potassium: 4.4 mmol/L (ref 3.5–5.1)
Sodium: 136 mmol/L (ref 135–145)

## 2023-02-10 MED ORDER — DOCUSATE SODIUM 100 MG PO CAPS: 100.0000 mg | ORAL_CAPSULE | Freq: Two times a day (BID) | ORAL | 0 refills | Status: AC

## 2023-02-10 MED ORDER — SENNA 8.6 MG PO TABS: 2.0000 | ORAL_TABLET | Freq: Every day | ORAL | 0 refills | Status: AC

## 2023-02-10 MED ORDER — ASPIRIN 81 MG PO CHEW: 81.0000 mg | CHEWABLE_TABLET | Freq: Two times a day (BID) | ORAL | 0 refills | Status: AC

## 2023-02-10 MED ORDER — POLYETHYLENE GLYCOL 3350 17 G PO PACK: 17.0000 g | PACK | Freq: Every day | ORAL | 0 refills | Status: AC | PRN

## 2023-02-10 MED ORDER — ONDANSETRON HCL 4 MG PO TABS: 4.0000 mg | ORAL_TABLET | Freq: Three times a day (TID) | ORAL | 0 refills | Status: AC | PRN

## 2023-02-10 MED ORDER — HYDROCODONE-ACETAMINOPHEN 10-325 MG PO TABS: 0.5000 | ORAL_TABLET | ORAL | 0 refills | Status: AC | PRN

## 2023-02-10 NOTE — Progress Notes (Signed)
    Subjective:  Patient reports pain as mild to moderate.  Denies N/V/CP/SOB/Abd pain. He reports some nausea yesterday. He has mild thigh pain. Denies any tingling or numbness in LE bilaterally.   Objective:   VITALS:   Vitals:   02/09/23 1347 02/09/23 2037 02/10/23 0309 02/10/23 0643  BP: 113/70 (!) 145/88 111/68 96/62  Pulse: (!) 55 66 82 83  Resp: 16 18 16 16   Temp: 97.8 F (36.6 C) 98 F (36.7 C) 98.6 F (37 C) 98.8 F (37.1 C)  TempSrc: Oral Oral Oral   SpO2: 99% 99% 96% 95%  Weight:      Height:        Patient sleeping upon entering room. Woken up for exam. NAD.  Neurologically intact ABD soft Neurovascular intact Sensation intact distally Intact pulses distally Dorsiflexion/Plantar flexion intact Incision: dressing C/D/I No cellulitis present Compartment soft Painless log rolling of this hip.   Lab Results  Component Value Date   WBC 11.1 (H) 02/10/2023   HGB 11.9 (L) 02/10/2023   HCT 37.7 (L) 02/10/2023   MCV 96.7 02/10/2023   PLT 169 02/10/2023   BMET    Component Value Date/Time   NA 136 02/10/2023 0400   NA 140 03/05/2018 1205   K 4.4 02/10/2023 0400   CL 107 02/10/2023 0400   CO2 21 (L) 02/10/2023 0400   GLUCOSE 114 (H) 02/10/2023 0400   BUN 23 02/10/2023 0400   BUN 18 03/05/2018 1205   CREATININE 1.10 02/10/2023 0400   CALCIUM 8.4 (L) 02/10/2023 0400   GFRNONAA >60 02/10/2023 0400     Assessment/Plan: 1 Day Post-Op   Principal Problem:   Osteoarthritis of left hip   WBAT with walker DVT ppx: Aspirin, SCDs, TEDS PO pain control PT/OT: Patient ambulated 100 feet with PT yesterday. Continue PT today.  Dispo:  - D/c home once cleared with PT.    Charlott Rakes, PA-C 02/10/2023, 7:43 AM  [facility]  [place] [address], [place], [place] [address] Phone: [phone] [email or web address] Facebook  [employer]

## 2023-02-10 NOTE — Discharge Instructions (Signed)
? ?Dr. Brian Swinteck ?Joint Replacement Specialist ?[facility] ?[address] ?[place], [place] [address] ?[phone] ? ? ?TOTAL HIP REPLACEMENT POSTOPERATIVE DIRECTIONS ? ? ? ?Hip Rehabilitation, Guidelines Following Surgery  ? ?WEIGHT BEARING ?Weight bearing as tolerated with assist device (walker, cane, etc) as directed, use it as long as suggested by your surgeon or therapist, typically at least 4-6 weeks. ? ?The results of a hip operation are greatly improved after range of motion and muscle strengthening exercises. Follow all safety measures which are given to protect your hip. If any of these exercises cause increased pain or swelling in your joint, decrease the amount until you are comfortable again. Then slowly increase the exercises. Call your caregiver if you have problems or questions.  ? ?HOME CARE INSTRUCTIONS  ?Most of the following instructions are designed to prevent the dislocation of your new hip.  ?Remove items at home which could result in a fall. This includes throw rugs or furniture in walking pathways.  ?Continue medications as instructed at time of discharge. ?You may have some home medications which will be placed on hold until you complete the course of blood thinner medication. ?You may start showering once you are discharged home. Do not remove your dressing. ?Do not put on socks or shoes without following the instructions of your caregivers.   ?Sit on chairs with arms. Use the chair arms to help push yourself up when arising.  ?Arrange for the use of a toilet seat elevator so you are not sitting low.  ?Walk with walker as instructed.  ?You may resume a sexual relationship in one month or when given the OK by your caregiver.  ?Use walker as long as suggested by your caregivers.  ?You may put full weight on your legs and walk as much as is comfortable. ?Avoid periods of inactivity such as sitting longer than an hour when not asleep. This helps prevent blood  clots.  ?You may return to work once you are cleared by your surgeon.  ?Do not drive a car for 6 weeks or until released by your surgeon.  ?Do not drive while taking narcotics.  ?Wear elastic stockings for two weeks following surgery during the day but you may remove then at night.  ?Make sure you keep all of your appointments after your operation with all of your doctors and caregivers. You should call the office at the above phone number and make an appointment for approximately two weeks after the date of your surgery. ?Please pick up a stool softener and laxative for home use as long as you are requiring pain medications. ?ICE to the affected hip every three hours for 30 minutes at a time and then as needed for pain and swelling. Continue to use ice on the hip for pain and swelling from surgery. You may notice swelling that will progress down to the foot and ankle.  This is normal after surgery.  Elevate the leg when you are not up walking on it.   ?It is important for you to complete the blood thinner medication as prescribed by your doctor. ?Continue to use the breathing machine which will help keep your temperature down.  It is common for your temperature to cycle up and down following surgery, especially at night when you are not up moving around and exerting yourself.  The breathing machine keeps your lungs expanded and your temperature down. ? ?RANGE OF MOTION AND STRENGTHENING EXERCISES  ?These exercises are designed to help you   keep full movement of your hip joint. Follow your caregiver's or physical therapist's instructions. Perform all exercises about fifteen times, three times per day or as directed. Exercise both hips, even if you have had only one joint replacement. These exercises can be done on a training (exercise) mat, on the floor, on a table or on a bed. Use whatever works the best and is most comfortable for you. Use music or television while you are exercising so that the exercises are a  pleasant break in your day. This will make your life better with the exercises acting as a break in routine you can look forward to.  ?Lying on your back, slowly slide your foot toward your buttocks, raising your knee up off the floor. Then slowly slide your foot back down until your leg is straight again.  ?Lying on your back spread your legs as far apart as you can without causing discomfort.  ?Lying on your side, raise your upper leg and foot straight up from the floor as far as is comfortable. Slowly lower the leg and repeat.  ?Lying on your back, tighten up the muscle in the front of your thigh (quadriceps muscles). You can do this by keeping your leg straight and trying to raise your heel off the floor. This helps strengthen the largest muscle supporting your knee.  ?Lying on your back, tighten up the muscles of your buttocks both with the legs straight and with the knee bent at a comfortable angle while keeping your heel on the floor.  ? ?SKILLED REHAB INSTRUCTIONS: ?If the patient is transferred to a skilled rehab facility following release from the hospital, a list of the current medications will be sent to the facility for the patient to continue.  When discharged from the skilled rehab facility, please have the facility set up the patient's Home Health Physical Therapy prior to being released. Also, the skilled facility will be responsible for providing the patient with their medications at time of release from the facility to include their pain medication and their blood thinner medication. If the patient is still at the rehab facility at time of the two week follow up appointment, the skilled rehab facility will also need to assist the patient in arranging follow up appointment in our office and any transportation needs. ? ?POST-OPERATIVE OPIOID TAPER INSTRUCTIONS: ?It is important to wean off of your opioid medication as soon as possible. If you do not need pain medication after your surgery it is ok  to stop day one. ?Opioids include: ?Codeine, Hydrocodone(Norco, Vicodin), Oxycodone(Percocet, oxycontin) and hydromorphone amongst others.  ?Long term and even short term use of opiods can cause: ?Increased pain response ?Dependence ?Constipation ?Depression ?Respiratory depression ?And more.  ?Withdrawal symptoms can include ?Flu like symptoms ?Nausea, vomiting ?And more ?Techniques to manage these symptoms ?Hydrate well ?Eat regular healthy meals ?Stay active ?Use relaxation techniques(deep breathing, meditating, yoga) ?Do Not substitute Alcohol to help with tapering ?If you have been on opioids for less than two weeks and do not have pain than it is ok to stop all together.  ?Plan to wean off of opioids ?This plan should start within one week post op of your joint replacement. ?Maintain the same interval or time between taking each dose and first decrease the dose.  ?Cut the total daily intake of opioids by one tablet each day ?Next start to increase the time between doses. ?The last dose that should be eliminated is the evening dose.  ? ? ?MAKE   SURE YOU:  ?Understand these instructions.  ?Will watch your condition.  ?Will get help right away if you are not doing well or get worse. ? ?Pick up stool softner and laxative for home use following surgery while on pain medications. ?Do not remove your dressing. ?The dressing is waterproof--it is OK to take showers. ?Continue to use ice for pain and swelling after surgery. ?Do not use any lotions or creams on the incision until instructed by your surgeon. ?Total Hip Protocol. ? ?

## 2023-02-10 NOTE — Progress Notes (Signed)
Physical Therapy Treatment Patient Details Name: Kevin Murillo MRN: [id number] DOB: [birth year] Today's Date: 02/10/2023   History of Present Illness Pt s/p L THR and with hx of R THR, CHF, and non-ishemic cardiomyopathy    PT Comments    POD # 1 Assisted OOB to amb in hallway and practice stairs went well.  Then returned to room to perform some TE's following HEP handout.  Instructed on proper tech, freq as well as use of ICE.   Addressed all mobility questions, discussed appropriate activity, educated on use of ICE.  Pt ready for D/C to home.   Recommendations for follow up therapy are one component of a multi-disciplinary discharge planning process, led by the attending physician.  Recommendations may be updated based on patient status, additional functional criteria and insurance authorization.  Follow Up Recommendations  Follow physician's recommendations for discharge plan and follow up therapies     Assistance Recommended at Discharge Intermittent Supervision/Assistance  Patient can return home with the following A little help with walking and/or transfers;A little help with bathing/dressing/bathroom;Assistance with cooking/housework;Assist for transportation;Help with stairs or ramp for entrance   Equipment Recommendations  None recommended by PT    Recommendations for Other Services       Precautions / Restrictions Precautions Precautions: Fall Restrictions Weight Bearing Restrictions: No Other Position/Activity Restrictions: WBAT     Mobility  Bed Mobility Overal bed mobility: Needs Assistance Bed Mobility: Supine to Sit     Supine to sit: Min guard, Supervision     General bed mobility comments: Increased time with min guard for safety only    Transfers Overall transfer level: Needs assistance Equipment used: Rolling walker (2 wheels) Transfers: Sit to/from Stand             General transfer comment: cues for LE management and use of UEs to  self assist    Ambulation/Gait Ambulation/Gait assistance: Supervision, Min guard Gait Distance (Feet): 75 Feet Assistive device: Rolling walker (2 wheels) Gait Pattern/deviations: Step-to pattern, Step-through pattern, Shuffle, Trunk flexed       General Gait Details: cues for sequence, posture and initial sequence   Stairs Stairs: Yes Stairs assistance: Min guard, Min assist Stair Management: No rails, Step to pattern, Forwards, With walker Number of Stairs: 4 General stair comments: VC's on proper tech   Wheelchair Mobility    Modified Rankin (Stroke Patients Only)       Balance                                            Cognition Arousal/Alertness: Awake/alert Behavior During Therapy: WFL for tasks assessed/performed Overall Cognitive Status: Within Functional Limits for tasks assessed                                          Exercises  Total Hip Replacement TE's following HEP Handout 10 reps ankle pumps 05 reps knee presses 05 reps heel slides 05 reps SAQ's 05 reps ABD Instructed how to use a belt loop to assist  Followed by ICE     General Comments        Pertinent Vitals/Pain Pain Assessment Pain Assessment: 0-10 Pain Score: 3  Pain Location: L hip Pain Descriptors / Indicators: Aching, Sore, Operative site guarding Pain Intervention(s): Monitored during  session, Premedicated before session, Repositioned, Ice applied    Home Living                          Prior Function            PT Goals (current goals can now be found in the care plan section) Progress towards PT goals: Progressing toward goals    Frequency    7X/week      PT Plan Current plan remains appropriate    Co-evaluation              AM-PAC PT "6 Clicks" Mobility   Outcome Measure  Help needed turning from your back to your side while in a flat bed without using bedrails?: A Little Help needed moving from  lying on your back to sitting on the side of a flat bed without using bedrails?: A Little Help needed moving to and from a bed to a chair (including a wheelchair)?: A Little Help needed standing up from a chair using your arms (e.g., wheelchair or bedside chair)?: A Little Help needed to walk in hospital room?: A Little Help needed climbing 3-5 steps with a railing? : A Little 6 Click Score: 18    End of Session Equipment Utilized During Treatment: Gait belt Activity Tolerance: Patient tolerated treatment well Patient left: in chair;with call bell/phone within reach;with chair alarm set Nurse Communication: Mobility status PT Visit Diagnosis: Difficulty in walking, not elsewhere classified (R26.2)     Time: [id number] PT Time Calculation (min) (ACUTE ONLY): 28 min  Charges:  $Gait Training: 8-22 mins $Therapeutic Activity: 8-22 mins                     Rica Koyanagi  PTA [place] [facility] M-F          [phone] Weekend pager [phone]

## 2023-02-21 DIAGNOSIS — Z96642 Presence of left artificial hip joint: Secondary | ICD-10-CM | POA: Diagnosis not present

## 2023-02-21 DIAGNOSIS — Z471 Aftercare following joint replacement surgery: Secondary | ICD-10-CM | POA: Diagnosis not present

## 2023-03-03 NOTE — Progress Notes (Signed)
Patient ID: Kevin Murillo, male   DOB: May 09, 1946, 77 y.o.   MRN: [id number] PCP: Dr. Timothy Lasso Cardiology: Dr. Shirlee Latch  77 y.o.with history of HTN and nonischemic cardiomyopathy (diagnosed in 2011) presents for cardiology followup.  He had an echo in 1/15, showing that EF remains 25-30% with mild LV dilation.  I had him do a cardiopulmonary exercise test in 3/15 that showed normal functional capacity.  Most recent echo in 4/19 showed EF remains 25-30%.  Repeat LHC/RHC in 4/19 showed no significant coronary disease, mildly elevated LV filling pressure, and CI 2.01.   Echo in 2/20 showed EF 30-35%, normal RV. Echo in 9/21 showed EF 30% with mild LV dilation, diffuse hypokinesis.   In 12/21, he was sitting in a restaurant eating breakfast when he passed out suddenly with no prodrome. He quickly regained consciousness. He was admitted to the hospital.  He was found to have a UTI, Jardiance was stopped.  His BP was low, ?due to urosepsis/dehydration.  He was also taken off Bidil and spironolactone. No prior dizzy spells and none since.  Zio patch in 2/22 showed 4% PVCs, short 4 beat NSVT run.  Patient saw Dr. Graciela Husbands with EP, syncope possibly due to SGLT2 inhibitor-related UTI, [place] Pepper has been stopped.   Echo in 4/23 showed EF 30% with global hypokinesis, mildly decreased RV systolic function.   S/p L THR 3/24.  Today he returns for HF follow up. Overall feeling fine. He is doing well post-hip replacement. No dyspnea walking on flat ground with cane. Not very physically active but planning on going to [employer] or another exercise program. Ankles have been swelling since surgery. Denies palpitations, CP, dizziness, or PND/Orthopnea. Appetite ok. No fever or chills. Weight at home 250 pounds. Taking all medications.   ECG (personally reviewed): None ordered today.  Labs (7/11): HDL 60, LDL 122, K 4.4, creatinine 1.0, BNP 86, SPEP normal, transferrin saturation 24%  Labs (4/19): K 4.4,  creatinine 1.06 Labs (5/19): K 4.4, creatinine 1.13 Labs (8/19): K 4.2, creatinine 1.29 Labs (11/19): K 4.1, creatinine 1.09 Labs (2/21): K 4.2, creatinine 1.63 Labs (5/21): K 4.3, creatinine 1.2, LDL 47 Labs (12/21): K 4, creatinine 1.37, Hgb 12.2 Labs (2/22): K 4.2, creatinine 1.39 Labs (5/22): K 4.5, creatinine 1.25, LDL 55 Labs (9/22): K 3.8, creatinine 1.22 Labs (7/23): K 4.1, creatinine 1.41 Labs (1/24): LDL 41, HDL 46 Labs (3/24): K 4.4, creatinine 1.10  Allergies (verified):  No Known Drug Allergies   Past Medical History:  1. HTN  2. Left hand neuropathy  3. Nonischemic cardiomyopathy: Echo (6/11) with EF 30-35%, posteroinferior severe hypokinesis, moderate diastolic dysfunction, mild left atrial enlargement, mild LV hypertrophy. LHC (7/11): EF 30-35% with diffuse hypokinesis, no angiographic CAD. SPEP and Fe studies unremarkable, TSH normal.  Repeat echo (1/12) with EF 45%, mild global hypokinesis, mild LVH, normal RV size and systolic function.  Echo (6/14) with EF 20-25%, diffuse hypokinesis, mild LV dilation. Echo (1/15) with mild LV dilation, EF 25-30%, diffuse hypokinesis, prominent apical trabeculation, mildly dilated RV. CPX (3/15) with VO2 max 20.1, VE/VCO2 slope 34.7 (near normal when adjusted to respiratory compensation point) => this CPX showed normal functional capacity for his age.  - Echo (4/19): EF 25-30%, mild MR.  - RHC/LHC (4/19): No significant CAD.  Mean RA 6, PA 46/18 mean 32, mean PCWP 21, CI 2.01, PVR 2.4 WU (pulmonary venous hypertension).  - Echo (2/20): EF 30-35%, moderate diastolic dysfunction, normal RV size and systolic function, moderate LAE.  -  Echo (9/21): EF 30%, mild LV dilation, diffuse hypokinesis, normal RV.  - Echo (4/23): EF 30% with global hypokinesis, mildly decreased RV systolic function. 4. Hyperlipidemia  5. Syncope (12/21): No prodrome.  - Zio patch in 2/22 showed 4% PVCs, short 4 beat NSVT run. 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    Family History:  Mother died with CHF at 59.   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  Quit smoking in 1996.  Occasional ETOH  Married with 3 children. Wife is now in dementia unit at [facility].    ROS: All systems reviewed and negative except as per HPI.   Current Outpatient Medications  Medication Sig Dispense Refill   aspirin (ASPIRIN CHILDRENS) 81 MG chewable tablet Chew 1 tablet (81 mg total) by mouth 2 (two) times daily with a meal. 90 tablet 0   carvedilol (COREG) 25 MG tablet TAKE 1 TABLET BY MOUTH TWICE A DAY 180 tablet 0   docusate sodium (COLACE) 100 MG capsule Take 1 capsule (100 mg total) by mouth 2 (two) times daily. 60 capsule 0   isosorbide-hydrALAZINE (BIDIL) 20-37.5 MG tablet Take 1 tablet by mouth 3 (three) times daily. 280 tablet 3   Multiple Vitamin (MULTIVITAMIN WITH MINERALS) TABS tablet Take 1 tablet by mouth daily.     ondansetron (ZOFRAN) 4 MG tablet Take 1 tablet (4 mg total) by mouth every 8 (eight) hours as needed for nausea or vomiting. 30 tablet 0   polyethylene glycol (MIRALAX) 17 g packet Take 17 g by mouth daily as needed for mild constipation or moderate constipation. 14 each 0   sacubitril-valsartan (ENTRESTO) 97-103 MG Take 1 tablet by mouth 2 (two) times daily. 60 tablet 11   simvastatin (ZOCOR) 40 MG tablet Take 40 mg by mouth daily.      spironolactone (ALDACTONE) 25 MG tablet TAKE 1 TABLET (25 MG TOTAL) BY MOUTH DAILY. 90 tablet 3   No current facility-administered medications for this encounter.   Wt Readings from Last 3 Encounters:  03/06/23 115.7 kg (255 lb)  02/09/23 109.8 kg (242 lb)  01/30/23 109.8 kg (242 lb)   BP 130/80   Pulse 70   Wt 115.7 kg (255 lb)   SpO2 98%   BMI 39.06 kg/m  Physical Exam General:  NAD. No resp difficulty, walked into clinc HEENT: Normal Neck: Supple. JVP 8. Carotids 2+ bilat; no bruits.  No lymphadenopathy or thryomegaly appreciated. Cor: PMI nondisplaced. Regular rate & rhythm. No rubs, gallops or murmurs. Lungs: Clear Abdomen: Soft, obese, nontender, nondistended. No hepatosplenomegaly. No bruits or masses. Good bowel sounds. Extremities: No cyanosis, clubbing, rash, 1+ BLE pre-tibial edema Neuro: Alert & oriented x 3, cranial nerves grossly intact. Moves all 4 extremities w/o difficulty. Affect pleasant.  Assessment/Plan: 1. Nonischemic cardiomyopathy: Possibly due to viral myocarditis versus HTN.  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 Echo in 4/19 showed EF 25-30%.  No CAD on 4/19 cath, but CI marginal at 2.01 with mildly elevated PCWP.  Echo in 9/21 showed EF fairly stable at 30%.  Echo in 4/23 showed EF 30% with global hypokinesis, mildly decreased RV systolic function.NYHA class II symptoms.  He is mildly volume overloaded on exam.  - He saw Dr. Graciela Husbands to discuss ICD placement post-syncopal episode.  Syncope likely related to UTI rather than arrhythmia.  He  decided against ICD placement.    - Start Lasix 20 mg daily, add 10 KCL daily. BMET and BNP today, repeat BMET in 2 weeks. - Continue Coreg 25 mg bid.  - Continue spironolactone 25 mg daily.  - Continue Bidil 1 tab tid. Has not had AM meds, will not increase today. - Continue Entresto 97/103 bid.   - Considered Jardiance but had UTI and syncope, and patient tells me he did not tolerate it. Will leave off. - Discussed ICD again today, he is not interested but may consider if EF drops. Repeat echo next visit. 2. HTN: BP controlled.  3. Hyperlipidemia: Good lipids 1/24 4. Syncope: Unexplained, occurred while seated.  No prodrome.  He had a UTI, possible that UTI and dehydration could have played role but would expect prodrome.  Zio patch showed 4% PVCs.  - As above, saw EP and decided against ICD placement.  5. Deconditioning: Encouraged getting back to exercise  routine.  Followup with Dr. Shirlee LatchMcLean in 3 months, update echo.  Anderson MaltaJessica M [place], [place]NP 03/06/2023

## 2023-03-06 ENCOUNTER — Encounter (HOSPITAL_COMMUNITY): Payer: Self-pay

## 2023-03-06 ENCOUNTER — Ambulatory Visit (HOSPITAL_COMMUNITY)
Admission: RE | Admit: 2023-03-06 | Discharge: 2023-03-06 | Disposition: A | Discharge status: Home / Self Care | Payer: PPO | Source: Ambulatory Visit | Attending: Family Medicine | Admitting: Family Medicine

## 2023-03-06 VITALS — BP 130/80 | HR 70 | Wt 255.0 lb

## 2023-03-06 DIAGNOSIS — I10 Essential (primary) hypertension: Secondary | ICD-10-CM

## 2023-03-06 DIAGNOSIS — I428 Other cardiomyopathies: Secondary | ICD-10-CM | POA: Diagnosis not present

## 2023-03-06 DIAGNOSIS — Z87440 Personal history of urinary (tract) infections: Secondary | ICD-10-CM | POA: Diagnosis not present

## 2023-03-06 DIAGNOSIS — Z87891 Personal history of nicotine dependence: Secondary | ICD-10-CM | POA: Insufficient documentation

## 2023-03-06 DIAGNOSIS — Z7182 Exercise counseling: Secondary | ICD-10-CM | POA: Insufficient documentation

## 2023-03-06 DIAGNOSIS — R55 Syncope and collapse: Secondary | ICD-10-CM

## 2023-03-06 DIAGNOSIS — I493 Ventricular premature depolarization: Secondary | ICD-10-CM | POA: Insufficient documentation

## 2023-03-06 DIAGNOSIS — I272 Pulmonary hypertension, unspecified: Secondary | ICD-10-CM | POA: Diagnosis not present

## 2023-03-06 DIAGNOSIS — I5022 Chronic systolic (congestive) heart failure: Secondary | ICD-10-CM | POA: Insufficient documentation

## 2023-03-06 DIAGNOSIS — R5381 Other malaise: Secondary | ICD-10-CM

## 2023-03-06 DIAGNOSIS — Z7982 Long term (current) use of aspirin: Secondary | ICD-10-CM | POA: Diagnosis not present

## 2023-03-06 DIAGNOSIS — E785 Hyperlipidemia, unspecified: Secondary | ICD-10-CM | POA: Diagnosis not present

## 2023-03-06 DIAGNOSIS — Z79899 Other long term (current) drug therapy: Secondary | ICD-10-CM | POA: Diagnosis not present

## 2023-03-06 DIAGNOSIS — I110 Hypertensive heart disease with heart failure: Secondary | ICD-10-CM | POA: Diagnosis not present

## 2023-03-06 LAB — BASIC METABOLIC PANEL
Anion gap: 9 (ref 5–15)
BUN: 19 mg/dL (ref 8–23)
CO2: 26 mmol/L (ref 22–32)
Calcium: 9.4 mg/dL (ref 8.9–10.3)
Chloride: 102 mmol/L (ref 98–111)
Creatinine, Ser: 1.14 mg/dL (ref 0.61–1.24)
GFR, Estimated: >60 mL/min (ref >60)
Glucose, Bld: 90 mg/dL (ref 70–99)
Potassium: 4.1 mmol/L (ref 3.5–5.1)
Sodium: 137 mmol/L (ref 135–145)

## 2023-03-06 LAB — BRAIN NATRIURETIC PEPTIDE: B Natriuretic Peptide: 262 pg/mL — ABNORMAL HIGH (ref 0.0–100.0)

## 2023-03-06 MED ORDER — POTASSIUM CHLORIDE ER 10 MEQ PO TBCR: 10.0000 meq | EXTENDED_RELEASE_TABLET | Freq: Every day | ORAL | 8 refills | Status: DC

## 2023-03-06 MED ORDER — FUROSEMIDE 20 MG PO TABS: 20.0000 mg | ORAL_TABLET | Freq: Every day | ORAL | 8 refills | Status: DC

## 2023-03-06 NOTE — Addendum Note (Signed)
Encounter addended by: Demetrius Charity, RN on: 03/06/2023 11:50 AM  Actions taken: Flowsheet accepted, Charge Capture section accepted

## 2023-03-06 NOTE — Addendum Note (Signed)
Encounter addended by: Demetrius Charity, RN on: 03/06/2023 11:34 AM  Actions taken: Order Reconciliation Section accessed

## 2023-03-06 NOTE — Addendum Note (Signed)
Encounter addended by: Demetrius Charity, RN on: 03/06/2023 11:30 AM  Actions taken: Care Plan modified, Pharmacy for encounter modified, Order list changed, Diagnosis association updated, Clinical Note Signed

## 2023-03-06 NOTE — Patient Instructions (Signed)
Thank you for coming in today  Labs were done today, if any labs are abnormal the clinic will call you No news is good news  Please wear your compression hose daily, place them on as soon as you get up in the morning and remove before you go to bed at night.   Medications: Start Lasix 20 mg 1 tablet daily Start Potassium 10 meq 1 tablet daily  Follow up appointments:  Your physician recommends that you return for lab work in:  2 weeks for BMET  Your physician recommends that you schedule a follow-up appointment in:  3 months With Dr. Shirlee Latch with echo  Your physician has requested that you have an echocardiogram. Echocardiography is a painless test that uses sound waves to create images of your heart. It provides your doctor with information about the size and shape of your heart and how well your heart's chambers and valves are working. This procedure takes approximately one hour. There are no restrictions for this procedure.   You will receive a reminder letter in the mail a few months in advance. If you don't receive a letter, please call our office to schedule the follow-up appointment.     Do the following things EVERYDAY: Weigh yourself in the morning before breakfast. Write it down and keep it in a log. Take your medicines as prescribed Eat low salt foods--Limit salt (sodium) to 2000 mg per day.  Stay as active as you can everyday Limit all fluids for the day to less than 2 liters   At the [facility], you and your health needs are our priority. As part of our continuing mission to provide you with exceptional heart care, we have created designated Provider Care Teams. These Care Teams include your primary Cardiologist (physician) and Advanced Practice Providers (APPs- Physician Assistants and Nurse Practitioners) who all work together to provide you with the care you need, when you need it.   You may see any of the following providers on your designated  Care Team at your next follow up: Dr Arvilla Meres Dr Marca Ancona Dr. Marcos Eke, NP Robbie Lis, [place] [facility] [place], [place] Brynda Peon, NP Karle Plumber, PharmD   Please be sure to bring in all your medications bottles to every appointment.    Thank you for choosing [facility]  If you have any questions or concerns before your next appointment please send [place] a message through [place] or call our office at [phone].    TO LEAVE A MESSAGE FOR THE NURSE SELECT OPTION 2, PLEASE LEAVE A MESSAGE INCLUDING: YOUR NAME DATE OF BIRTH CALL BACK NUMBER REASON FOR CALL**this is important as we prioritize the call backs  YOU WILL RECEIVE A CALL BACK THE SAME DAY AS LONG AS YOU CALL BEFORE 4:00 PM

## 2023-03-16 ENCOUNTER — Telehealth (HOSPITAL_COMMUNITY): Payer: Self-pay

## 2023-03-16 ENCOUNTER — Other Ambulatory Visit: Payer: Self-pay | Admitting: Cardiology

## 2023-03-16 ENCOUNTER — Other Ambulatory Visit (HOSPITAL_COMMUNITY): Payer: Self-pay

## 2023-03-16 DIAGNOSIS — I428 Other cardiomyopathies: Secondary | ICD-10-CM

## 2023-03-16 NOTE — Telephone Encounter (Signed)
Patient Advocate Encounter  Prior authorization for [employer] submitted and APPROVED. Test billing returns $47 copay for 30 day supply.  Key [id number] Effective: 03/16/2023 - 03/15/2024  Burnell Blanks, CPhT Rx Patient Advocate Phone: [phone]

## 2023-03-20 ENCOUNTER — Ambulatory Visit (HOSPITAL_COMMUNITY)
Admission: RE | Admit: 2023-03-20 | Discharge: 2023-03-20 | Disposition: A | Discharge status: Home / Self Care | Payer: PPO | Source: Ambulatory Visit | Attending: Cardiology | Admitting: Cardiology

## 2023-03-20 DIAGNOSIS — I5022 Chronic systolic (congestive) heart failure: Secondary | ICD-10-CM

## 2023-03-20 LAB — BASIC METABOLIC PANEL
Anion gap: 6 (ref 5–15)
BUN: 18 mg/dL (ref 8–23)
CO2: 27 mmol/L (ref 22–32)
Calcium: 9.3 mg/dL (ref 8.9–10.3)
Chloride: 104 mmol/L (ref 98–111)
Creatinine, Ser: 1.22 mg/dL (ref 0.61–1.24)
GFR, Estimated: >60 mL/min (ref >60)
Glucose, Bld: 100 mg/dL — ABNORMAL HIGH (ref 70–99)
Potassium: 4.5 mmol/L (ref 3.5–5.1)
Sodium: 137 mmol/L (ref 135–145)

## 2023-03-21 DIAGNOSIS — Z471 Aftercare following joint replacement surgery: Secondary | ICD-10-CM | POA: Diagnosis not present

## 2023-03-21 DIAGNOSIS — Z96642 Presence of left artificial hip joint: Secondary | ICD-10-CM | POA: Diagnosis not present

## 2023-03-27 ENCOUNTER — Emergency Department (HOSPITAL_COMMUNITY): Payer: PPO

## 2023-03-27 ENCOUNTER — Emergency Department (HOSPITAL_COMMUNITY)
Admission: EM | Admit: 2023-03-27 | Discharge: 2023-03-27 | Disposition: A | Discharge status: Home / Self Care | Payer: PPO | Attending: Emergency Medicine | Admitting: Emergency Medicine

## 2023-03-27 ENCOUNTER — Other Ambulatory Visit: Payer: Self-pay

## 2023-03-27 DIAGNOSIS — E860 Dehydration: Secondary | ICD-10-CM | POA: Diagnosis not present

## 2023-03-27 DIAGNOSIS — Z79899 Other long term (current) drug therapy: Secondary | ICD-10-CM | POA: Insufficient documentation

## 2023-03-27 DIAGNOSIS — J9811 Atelectasis: Secondary | ICD-10-CM | POA: Diagnosis not present

## 2023-03-27 DIAGNOSIS — I5022 Chronic systolic (congestive) heart failure: Secondary | ICD-10-CM | POA: Insufficient documentation

## 2023-03-27 DIAGNOSIS — I110 Hypertensive heart disease with heart failure: Secondary | ICD-10-CM | POA: Diagnosis not present

## 2023-03-27 DIAGNOSIS — R0902 Hypoxemia: Secondary | ICD-10-CM | POA: Diagnosis not present

## 2023-03-27 DIAGNOSIS — R000 Tachycardia, unspecified: Secondary | ICD-10-CM | POA: Diagnosis not present

## 2023-03-27 DIAGNOSIS — Z7982 Long term (current) use of aspirin: Secondary | ICD-10-CM | POA: Insufficient documentation

## 2023-03-27 DIAGNOSIS — I959 Hypotension, unspecified: Secondary | ICD-10-CM | POA: Diagnosis not present

## 2023-03-27 DIAGNOSIS — R55 Syncope and collapse: Secondary | ICD-10-CM

## 2023-03-27 DIAGNOSIS — R29818 Other symptoms and signs involving the nervous system: Secondary | ICD-10-CM | POA: Diagnosis not present

## 2023-03-27 DIAGNOSIS — R1111 Vomiting without nausea: Secondary | ICD-10-CM | POA: Diagnosis not present

## 2023-03-27 LAB — COMPREHENSIVE METABOLIC PANEL
ALT: 14 U/L (ref 0–44)
AST: 32 U/L (ref 15–41)
Albumin: 3.6 g/dL (ref 3.5–5.0)
Alkaline Phosphatase: 66 U/L (ref 38–126)
Anion gap: 9 (ref 5–15)
BUN: 19 mg/dL (ref 8–23)
CO2: 24 mmol/L (ref 22–32)
Calcium: 9.4 mg/dL (ref 8.9–10.3)
Chloride: 103 mmol/L (ref 98–111)
Creatinine, Ser: 1.45 mg/dL — ABNORMAL HIGH (ref 0.61–1.24)
GFR, Estimated: 49 mL/min — ABNORMAL LOW (ref >60)
Glucose, Bld: 122 mg/dL — ABNORMAL HIGH (ref 70–99)
Potassium: 5.2 mmol/L — ABNORMAL HIGH (ref 3.5–5.1)
Sodium: 136 mmol/L (ref 135–145)
Total Bilirubin: 1.3 mg/dL — ABNORMAL HIGH (ref 0.3–1.2)
Total Protein: 6.9 g/dL (ref 6.5–8.1)

## 2023-03-27 LAB — CBC WITH DIFFERENTIAL/PLATELET
Abs Immature Granulocytes: 0.02 10*3/uL (ref 0.00–0.07)
Basophils Absolute: 0.1 10*3/uL (ref 0.0–0.1)
Basophils Relative: 1 %
Eosinophils Absolute: 0.1 10*3/uL (ref 0.0–0.5)
Eosinophils Relative: 1 %
HCT: 40.2 % (ref 39.0–52.0)
Hemoglobin: 12.6 g/dL — ABNORMAL LOW (ref 13.0–17.0)
Immature Granulocytes: 0 %
Lymphocytes Relative: 20 %
Lymphs Abs: 1.6 10*3/uL (ref 0.7–4.0)
MCH: 30.1 pg (ref 26.0–34.0)
MCHC: 31.3 g/dL (ref 30.0–36.0)
MCV: 96.2 fL (ref 80.0–100.0)
Monocytes Absolute: 0.7 10*3/uL (ref 0.1–1.0)
Monocytes Relative: 9 %
Neutro Abs: 5.5 10*3/uL (ref 1.7–7.7)
Neutrophils Relative %: 69 %
Platelets: 235 10*3/uL (ref 150–400)
RBC: 4.18 MIL/uL — ABNORMAL LOW (ref 4.22–5.81)
RDW: 13.9 % (ref 11.5–15.5)
WBC: 7.9 10*3/uL (ref 4.0–10.5)
nRBC: 0 % (ref 0.0–0.2)

## 2023-03-27 LAB — TROPONIN I (HIGH SENSITIVITY)
Troponin I (High Sensitivity): 24 ng/L — ABNORMAL HIGH (ref <18)
Troponin I (High Sensitivity): 19 ng/L — ABNORMAL HIGH (ref <18)

## 2023-03-27 LAB — LACTIC ACID, PLASMA
Lactic Acid, Venous: 2.4 mmol/L (ref 0.5–1.9)
Lactic Acid, Venous: 1.1 mmol/L (ref 0.5–1.9)

## 2023-03-27 MED ORDER — LACTATED RINGERS IV BOLUS
500.0000 mL | Freq: Once | INTRAVENOUS | Status: AC
  Administered 2023-03-27: 500 mL via INTRAVENOUS

## 2023-03-27 MED ORDER — IOHEXOL 350 MG/ML SOLN
75.0000 mL | Freq: Once | INTRAVENOUS | Status: AC | PRN
  Administered 2023-03-27: 75 mL via INTRAVENOUS

## 2023-03-27 MED ORDER — LACTATED RINGERS IV BOLUS: 1000.0000 mL | Freq: Once | INTRAVENOUS | Status: DC

## 2023-03-27 NOTE — Discharge Instructions (Signed)
Follow-up with your regular doctor either the end of this week or next week.  Drink plenty of fluids.  Return if any problems

## 2023-03-27 NOTE — ED Notes (Signed)
Pt able to ambulate to restroom with steady gait.

## 2023-03-27 NOTE — ED Notes (Signed)
Pt provided with AVS.  Education complete; all questions answered.  Pt leaving ED in stable condition at this time via wheelchair with all belongings.  Pt taken to family's vehicle at ED entrance for discharge.

## 2023-03-27 NOTE — ED Triage Notes (Signed)
Pt to the ed from work with a CC of LOC with a fall. Prior to ems arrival. Pt then had another episode with EMS for about 30 secs. Pt BP was 50/30 for ems. Pt is alert, oriented. Pt denies cp, dizziness.

## 2023-03-27 NOTE — ED Provider Notes (Signed)
[facility]GENC[facility] DEPARTMENT AT [facility] Provider Note   CSN: [id number] Arrival date & time: 03/27/23  1324     History  Chief Complaint  Patient presents with   Loss of Consciousness    Kevin Murillo is a 77 y.o. male with HTN, HLD, OA, dilated cardiomyopathy, chronic systolic HF (03/02/22 EF 30%), history of syncope, presents with LOC, hypotension.   Patient was eating breakfast with his family when he had a loss of consciousness that occurred very suddenly then had another episode with EMS for about 30 secs. Pt BP was 50/30 for ems. Pt is alert, oriented. Pt denies CP, SOB, lightheadedness, palpitations, abdominal pain, recent lower extremity swelling.  He states he had 1 episode of nausea and vomiting right after he had the loss of consciousness.  He states he feels fine right now. Felt lightheaded prior to losing consciousness. Hadn't had anything to eat/drink prior to the episode. Pressure soft on arrival to ED in 90s systolic.  Per chart review, patient did recently have left total hip arthroplasty on 02/09/2023.  Also patient had syncope in 2021 ultimately attributed to UTI and dehydration.    Loss of Consciousness      Home Medications Prior to Admission medications   Medication Sig Start Date End Date Taking? Authorizing Provider  aspirin (ASPIRIN CHILDRENS) 81 MG chewable tablet Chew 1 tablet (81 mg total) by mouth 2 (two) times daily with a meal. 02/10/23 03/27/23  Hill, Alain Honey, PA-C  carvedilol (COREG) 25 MG tablet TAKE 1 TABLET BY MOUTH TWICE A DAY 03/16/23   Laurey Morale, MD  ENTRESTO 97-103 MG TAKE 1 TABLET BY MOUTH TWICE A DAY 03/16/23   Laurey Morale, MD  furosemide (LASIX) 20 MG tablet Take 1 tablet (20 mg total) by mouth daily. 03/06/23 12/01/23  Jacklynn Ganong, FNP  isosorbide-hydrALAZINE (BIDIL) 20-37.5 MG tablet TAKE 1 TABLET BY MOUTH THREE TIMES A DAY 03/16/23   Laurey Morale, MD  Multiple Vitamin (MULTIVITAMIN WITH MINERALS)  TABS tablet Take 1 tablet by mouth daily.    [provider]  ondansetron (ZOFRAN) 4 MG tablet Take 1 tablet (4 mg total) by mouth every 8 (eight) hours as needed for nausea or vomiting. 02/10/23 02/10/24  Clois Dupes, PA-C  potassium chloride (KLOR-CON) 10 MEQ tablet Take 1 tablet (10 mEq total) by mouth daily. 03/06/23 12/01/23  Jacklynn Ganong, FNP  simvastatin (ZOCOR) 40 MG tablet Take 40 mg by mouth daily.  05/16/16   [provider]  spironolactone (ALDACTONE) 25 MG tablet TAKE 1 TABLET (25 MG TOTAL) BY MOUTH DAILY. 01/16/23   Laurey Morale, MD      Allergies    Patient has no known allergies.    Review of Systems   Review of Systems  Cardiovascular:  Positive for syncope.   Review of systems Negative for f/c.  A 10 point review of systems was performed and is negative unless otherwise reported in HPI.  Physical Exam Updated Vital Signs BP 95/62   Pulse 63   Temp 99.1 F (37.3 C) (Oral)   Resp 16   Ht 5\' 9"  (1.753 m)   Wt 112.5 kg   SpO2 99%   BMI 36.62 kg/m  Physical Exam General: Normal appearing male, lying in bed.  HEENT: PERRLA, EOMI, Sclera anicteric, MMM, trachea midline. Tongue protrudes midline. Cardiology: RRR, no murmurs/rubs/gallops. BL radial and DP pulses equal bilaterally.  Resp: Normal respiratory rate and effort. CTAB, no wheezes,  rhonchi, crackles.  Abd: Soft, non-tender, non-distended. No rebound tenderness or guarding.  GU: Deferred. MSK: Well healed surgical wound in L anterior hip, no erythema/induration/fluctuance or hematoma. No peripheral edema or signs of trauma. Extremities without deformity or TTP. No cyanosis or clubbing. Skin: warm, dry. No rashes or lesions. Back: No CVA tenderness Neuro: A&Ox4, CNs II-XII grossly intact. MAEs. Sensation grossly intact.  Psych: Normal mood and affect.   ED Results / Procedures / Treatments   Labs (all labs ordered are listed, but only abnormal results are displayed) Labs Reviewed  CBC  WITH DIFFERENTIAL/PLATELET - Abnormal; Notable for the following components:      Result Value   RBC 4.18 (*)    Hemoglobin 12.6 (*)    All other components within normal limits  COMPREHENSIVE METABOLIC PANEL - Abnormal; Notable for the following components:   Potassium 5.2 (*)    Glucose, Bld 122 (*)    Creatinine, Ser 1.45 (*)    Total Bilirubin 1.3 (*)    GFR, Estimated 49 (*)    All other components within normal limits  LACTIC ACID, PLASMA - Abnormal; Notable for the following components:   Lactic Acid, Venous 2.4 (*)    All other components within normal limits  TROPONIN I (HIGH SENSITIVITY) - Abnormal; Notable for the following components:   Troponin I (High Sensitivity) 24 (*)    All other components within normal limits  LACTIC ACID, PLASMA  URINALYSIS, ROUTINE W REFLEX MICROSCOPIC  TROPONIN I (HIGH SENSITIVITY)    EKG EKG Interpretation  Date/Time:  Monday March 27 2023 14:10:11 EDT Ventricular Rate:  67 PR Interval:  242 QRS Duration: 133 QT Interval:  398 QTC Calculation: 421 R Axis:   -83 Text Interpretation: Sinus rhythm Prolonged PR interval Nonspecific IVCD with LAD Confirmed by Vivi Barrack [phone]) on 03/27/2023 4:31:04 PM  Radiology CTH: 1. No acute intracranial abnormality.   CT PE: 1. No evidence of pulmonary embolism or other acute intrathoracic findings. 2. Mild bibasilar subsegmental atelectasis. 3. Colonic diverticulosis without evidence of acute diverticulitis. 4. Aortic atherosclerosis (ICD10-I70.0).  Procedures Procedures    Medications Ordered in ED Medications  lactated ringers bolus 500 mL (0 mLs Intravenous Stopped 03/27/23 1607)    ED Course/ Medical Decision Making/ A&P                          Medical Decision Making Amount and/or Complexity of Data Reviewed Labs: ordered. Decision-making details documented in ED Course. Radiology: ordered.  Risk Prescription drug management.    This patient presents to the ED for  concern of syncope; this involves an extensive number of treatment options, and is a complaint that carries with it a high risk of complications and morbidity.  I considered the following differential and admission for this acute, potentially life threatening condition. Pt is now hemodynamically stable and well-appearing.  MDM:    DDX for syncope includes but is not limited to:  Consider anemia and electrolyte abnormalities as possible etiologies. Consider arrhythmias and ACS, although without associated symptoms, less likely. Higher suspicion for PE despite absence of chest pain or dyspnea, as patient with recent surgery/hospitalization and hypotension. Will eval w/ CT PE. Consider hemorrhage vs CVA, although neuro intact and no associated other symptoms. Low suspicion for HF, ICH (no trauma, headache), seizure (no witnessed seizure like activity, no postictal period, tongue laceration, bladder incontinence), stroke (no focal neuro deficits), HOCM (no murmur, family history of sudden death), ACS (neg  troponin, no anginal pain), aortic dissection (no chest pain), malignant arrhythmia on ekg or any family history of sudden death, or GI bleed (stable hgb).    Clinical Course as of 03/27/23 1631  Mon Mar 27, 2023  1629 BP: 95/62 BP improved after fluid bolus [HN]  1629 Lactic Acid, Venous(!!): 2.4 Receiving fluids [HN]  1629 Troponin I (High Sensitivity)(!): 24 [HN]  1629 Creatinine(!): 1.45 Mild elevation [HN]  1629 Potassium(!): 5.2 [HN]  1630 WBC: 7.9 No leukocytosis [HN]    Clinical Course User Index [HN] Loetta Rough, MD    Labs: I Ordered, and personally interpreted labs.  The pertinent results include:  those listed above  Imaging Studies ordered: I ordered imaging studies including CT PE I independently visualized and interpreted imaging. I agree with the radiologist interpretation  Additional history obtained from wife at bedside, chart review.    Cardiac Monitoring: The  patient was maintained on a cardiac monitor.  I personally viewed and interpreted the cardiac monitored which showed an underlying rhythm of: NSR w/ prolonged PR interval and IVCD, similar to prior EKGs  Reevaluation: After the interventions noted above, I reevaluated the patient and found that they have :improved  Social Determinants of Health: Patient lives independently   Disposition:  Pt with likely hypovolemia/orthostatic syncope today given lack of PO intake and improvement with fluids. He's had no recurrence of lightheadedness/syncope since arrival to ED. W/u shows mild elevation Cr and mild hyperkalemia likely caused by dehydration in clinical picture. Discussed with patient and his wife staying well hydrated and following up w/ PCP. Patient is signed out to the oncoming ED physician who is made aware of his history, presentation, exam, workup, and plan. Plan is to obtain CT PE and likely DC w/ o/p f/u.     Co morbidities that complicate the patient evaluation  Past Medical History:  Diagnosis Date   Arthritis    Chronic systolic CHF (congestive heart failure), NYHA class 1 (HCC)    History of echocardiogram    Echo 3/16: Mild LVH, EF 40-45%, anteroseptal akinesis, grade 1 diastolic dysfunction, moderate LAE // Echo 3/19: diff HK worse in basal and mid inf wall, mild LVH, EF 25-30, mild MR, mild LAE   Hyperlipidemia    Hypertension    Neuropathy of hand    Non-ischemic cardiomyopathy (HCC)    a. Echo 6/11: 30-35% // b. LHC 7/11: EF 30-35% with diffuse hypokinesis, no angiographic CAD. SPEP and Fe studies unremarkable, TSH normal. //  c. Echo 1/12: EF 45% // d. Echo 6/14: EF 20-25%  //  e. Echo EF 25-30%, diffuse hypokinesis, prominent apical trabeculation // f. CPX 3/15: normal fxnal capacity  // g. CPX 3/16 low norma fxnal capacity  // h. Echo 3/16: mild LVH, EF 40-45%    Obesity    PVC (premature ventricular contraction)    Rosacea    Tubular adenoma of colon 12/2010      Medicines Meds ordered this encounter  Medications   DISCONTD: lactated ringers bolus 1,000 mL   lactated ringers bolus 500 mL    I have reviewed the patients home medicines and have made adjustments as needed  Problem List / ED Course: Problem List Items Addressed This Visit       Other   Syncope and collapse   Other Visit Diagnoses     Dehydration    -  Primary  This note was created using dictation software, which may contain spelling or grammatical errors.    Loetta Rough, MD 04/04/23 1501

## 2023-03-27 NOTE — ED Notes (Signed)
Pt received for care at 1905.  This RN introduced self to pt.  Bed in lowest position, wheels locked.  Call bell within reach.

## 2023-03-29 ENCOUNTER — Other Ambulatory Visit (HOSPITAL_COMMUNITY): Payer: Self-pay

## 2023-03-29 ENCOUNTER — Telehealth (HOSPITAL_COMMUNITY): Payer: Self-pay

## 2023-03-29 DIAGNOSIS — I5022 Chronic systolic (congestive) heart failure: Secondary | ICD-10-CM

## 2023-03-29 NOTE — Telephone Encounter (Signed)
Patient called after being in the ED for dehydration. He wants to know if there should be a change in his fluid pill. He was not drinking enough prior, but states he is drinking more liquid now. He is concerned with getting dehydrated again and passing out.  Please advise.

## 2023-03-29 NOTE — Telephone Encounter (Signed)
I spoke to daughter and scheduled ZIO patch and labs for Friday. I explained concerns and that there are no med changes right now. She understood.

## 2023-03-31 ENCOUNTER — Inpatient Hospital Stay (HOSPITAL_COMMUNITY)
Admission: RE | Admit: 2023-03-31 | Discharge: 2023-03-31 | Disposition: A | Discharge status: Home / Self Care | Payer: PPO | Source: Ambulatory Visit | Attending: Cardiology | Admitting: Cardiology

## 2023-03-31 ENCOUNTER — Other Ambulatory Visit (HOSPITAL_COMMUNITY): Payer: Self-pay | Admitting: Cardiology

## 2023-03-31 ENCOUNTER — Ambulatory Visit (HOSPITAL_COMMUNITY)
Admission: RE | Admit: 2023-03-31 | Discharge: 2023-03-31 | Disposition: A | Discharge status: Home / Self Care | Payer: PPO | Source: Ambulatory Visit | Attending: Cardiology | Admitting: Cardiology

## 2023-03-31 ENCOUNTER — Other Ambulatory Visit (HOSPITAL_COMMUNITY): Payer: PPO

## 2023-03-31 ENCOUNTER — Encounter (HOSPITAL_COMMUNITY): Payer: Self-pay

## 2023-03-31 DIAGNOSIS — R55 Syncope and collapse: Secondary | ICD-10-CM

## 2023-03-31 DIAGNOSIS — I5022 Chronic systolic (congestive) heart failure: Secondary | ICD-10-CM | POA: Diagnosis not present

## 2023-03-31 LAB — BASIC METABOLIC PANEL
Anion gap: 9 (ref 5–15)
BUN: 20 mg/dL (ref 8–23)
CO2: 26 mmol/L (ref 22–32)
Calcium: 9.6 mg/dL (ref 8.9–10.3)
Chloride: 102 mmol/L (ref 98–111)
Creatinine, Ser: 1.37 mg/dL — ABNORMAL HIGH (ref 0.61–1.24)
GFR, Estimated: 53 mL/min — ABNORMAL LOW (ref >60)
Glucose, Bld: 96 mg/dL (ref 70–99)
Potassium: 4.4 mmol/L (ref 3.5–5.1)
Sodium: 137 mmol/L (ref 135–145)

## 2023-03-31 LAB — MAGNESIUM: Magnesium: 2.1 mg/dL (ref 1.7–2.4)

## 2023-03-31 LAB — BRAIN NATRIURETIC PEPTIDE: B Natriuretic Peptide: 113 pg/mL — ABNORMAL HIGH (ref 0.0–100.0)

## 2023-03-31 NOTE — Progress Notes (Signed)
Zio patch placed onto patient.  All instructions and information reviewed with patient, they verbalize understanding with no questions. 

## 2023-03-31 NOTE — Patient Instructions (Signed)
Your provider has recommended that  you wear a Zio Patch for 14 days.  This monitor will record your heart rhythm for our review.  IF you have any symptoms while wearing the monitor please press the button.  If you have any issues with the patch or you notice a red or orange light on it please call the company at [phone].  Once you remove the patch please mail it back to the company as soon as possible so we can get the results.  If you have any questions or concerns before your next appointment please send [place] a message through [place] or call our office at [phone].    TO LEAVE A MESSAGE FOR THE NURSE SELECT OPTION 2, PLEASE LEAVE A MESSAGE INCLUDING: YOUR NAME DATE OF BIRTH CALL BACK NUMBER REASON FOR CALL**this is important as we prioritize the call backs  YOU WILL RECEIVE A CALL BACK THE SAME DAY AS LONG AS YOU CALL BEFORE 4:00 PM  At the [facility], you and your health needs are our priority. As part of our continuing mission to provide you with exceptional heart care, we have created designated Provider Care Teams. These Care Teams include your primary Cardiologist (physician) and Advanced Practice Providers (APPs- Physician Assistants and Nurse Practitioners) who all work together to provide you with the care you need, when you need it.   You may see any of the following providers on your designated Care Team at your next follow up: Dr Arvilla Meres Dr Marca Ancona Dr. Marcos Eke, NP Robbie Lis, [place] [facility] [place], [place] Brynda Peon, NP Karle Plumber, PharmD   Please be sure to bring in all your medications bottles to every appointment.    Thank you for choosing [facility]

## 2023-04-01 DIAGNOSIS — R55 Syncope and collapse: Secondary | ICD-10-CM | POA: Diagnosis not present

## 2023-04-04 ENCOUNTER — Telehealth: Payer: Self-pay

## 2023-04-04 NOTE — Telephone Encounter (Signed)
  Transition Care Management Unsuccessful Follow-up Telephone Call  Date of discharge and from where:  [facility] 4/29  Attempts:  1st Attempt  Reason for unsuccessful TCM follow-up call:  Left voice message   Lenard Forth [facility] Guide, [facility] [phone] [address] [place], [place], [place] [address] Phone: [phone] Email: [email or web address]

## 2023-04-04 NOTE — Telephone Encounter (Signed)
Transition Care Management Follow-up Telephone Call Date of discharge and from where: [facility] How have you been since you were released from the hospital? Good  Any questions or concerns? No  Items Reviewed: Did the pt receive and understand the discharge instructions provided? Yes  Medications obtained and verified? Yes  Other? No  Any new allergies since your discharge? No  Dietary orders reviewed? No Do you have support at home? No     PCP Hospital f/u appt confirmed? Yes  Scheduled to see waiting for call back form PCP on  @ . Specialist Hospital f/u appt confirmed? Yes  Scheduled to see Cardiology 5/3 on  @ . Are transportation arrangements needed? No  If their condition worsens, is the pt aware to call PCP or go to the Emergency Dept.? Yes Was the patient provided with contact information for the PCP's office or ED? Yes Was to pt encouraged to call back with questions or concerns? Yes

## 2023-07-20 NOTE — Addendum Note (Signed)
Encounter addended by: Crissie Figures, RN on: 07/20/2023 3:11 PM  Actions taken: Imaging Exam ended

## 2023-07-24 ENCOUNTER — Telehealth (HOSPITAL_COMMUNITY): Payer: Self-pay

## 2023-07-24 NOTE — Telephone Encounter (Addendum)
Pt aware, agreeable, and verbalized understanding   ----- Message from Kevin Murillo sent at 07/23/2023  7:26 PM EDT ----- No particularly worrisome arrhythmias.  He had an episode of type 1 2nd degree AVB likely while sleeping.

## 2023-08-12 ENCOUNTER — Other Ambulatory Visit: Payer: Self-pay | Admitting: Cardiology

## 2023-08-12 DIAGNOSIS — I428 Other cardiomyopathies: Secondary | ICD-10-CM

## 2023-08-14 DIAGNOSIS — Z96643 Presence of artificial hip joint, bilateral: Secondary | ICD-10-CM | POA: Diagnosis not present

## 2023-08-14 DIAGNOSIS — Z471 Aftercare following joint replacement surgery: Secondary | ICD-10-CM | POA: Diagnosis not present

## 2023-09-08 ENCOUNTER — Other Ambulatory Visit: Payer: Self-pay | Admitting: Cardiology

## 2023-09-08 DIAGNOSIS — I428 Other cardiomyopathies: Secondary | ICD-10-CM

## 2023-12-09 ENCOUNTER — Other Ambulatory Visit (HOSPITAL_COMMUNITY): Payer: Self-pay | Admitting: Family Medicine

## 2023-12-29 ENCOUNTER — Other Ambulatory Visit: Payer: Self-pay | Admitting: Cardiology

## 2023-12-29 DIAGNOSIS — I428 Other cardiomyopathies: Secondary | ICD-10-CM

## 2024-01-02 ENCOUNTER — Other Ambulatory Visit (HOSPITAL_COMMUNITY): Payer: Self-pay | Admitting: Cardiology

## 2024-01-18 DIAGNOSIS — E785 Hyperlipidemia, unspecified: Secondary | ICD-10-CM | POA: Diagnosis not present

## 2024-01-18 DIAGNOSIS — R739 Hyperglycemia, unspecified: Secondary | ICD-10-CM | POA: Diagnosis not present

## 2024-01-18 DIAGNOSIS — I130 Hypertensive heart and chronic kidney disease with heart failure and stage 1 through stage 4 chronic kidney disease, or unspecified chronic kidney disease: Secondary | ICD-10-CM | POA: Diagnosis not present

## 2024-01-18 DIAGNOSIS — I5022 Chronic systolic (congestive) heart failure: Secondary | ICD-10-CM | POA: Diagnosis not present

## 2024-01-18 DIAGNOSIS — Z0189 Encounter for other specified special examinations: Secondary | ICD-10-CM | POA: Diagnosis not present

## 2024-01-18 DIAGNOSIS — Z1212 Encounter for screening for malignant neoplasm of rectum: Secondary | ICD-10-CM | POA: Diagnosis not present

## 2024-01-18 DIAGNOSIS — D649 Anemia, unspecified: Secondary | ICD-10-CM | POA: Diagnosis not present

## 2024-01-25 DIAGNOSIS — Z1331 Encounter for screening for depression: Secondary | ICD-10-CM | POA: Diagnosis not present

## 2024-01-25 DIAGNOSIS — Z0000 Encounter for general adult medical examination without abnormal findings: Secondary | ICD-10-CM | POA: Diagnosis not present

## 2024-01-25 DIAGNOSIS — R82998 Other abnormal findings in urine: Secondary | ICD-10-CM | POA: Diagnosis not present

## 2024-01-25 DIAGNOSIS — E785 Hyperlipidemia, unspecified: Secondary | ICD-10-CM | POA: Diagnosis not present

## 2024-01-25 DIAGNOSIS — I429 Cardiomyopathy, unspecified: Secondary | ICD-10-CM | POA: Diagnosis not present

## 2024-01-25 DIAGNOSIS — Z23 Encounter for immunization: Secondary | ICD-10-CM | POA: Diagnosis not present

## 2024-01-25 DIAGNOSIS — I493 Ventricular premature depolarization: Secondary | ICD-10-CM | POA: Diagnosis not present

## 2024-01-25 DIAGNOSIS — I501 Left ventricular failure: Secondary | ICD-10-CM | POA: Diagnosis not present

## 2024-01-25 DIAGNOSIS — N1832 Chronic kidney disease, stage 3b: Secondary | ICD-10-CM | POA: Diagnosis not present

## 2024-01-25 DIAGNOSIS — I5022 Chronic systolic (congestive) heart failure: Secondary | ICD-10-CM | POA: Diagnosis not present

## 2024-01-25 DIAGNOSIS — I77819 Aortic ectasia, unspecified site: Secondary | ICD-10-CM | POA: Diagnosis not present

## 2024-01-25 DIAGNOSIS — Z1389 Encounter for screening for other disorder: Secondary | ICD-10-CM | POA: Diagnosis not present

## 2024-01-25 DIAGNOSIS — I130 Hypertensive heart and chronic kidney disease with heart failure and stage 1 through stage 4 chronic kidney disease, or unspecified chronic kidney disease: Secondary | ICD-10-CM | POA: Diagnosis not present

## 2024-01-25 DIAGNOSIS — R739 Hyperglycemia, unspecified: Secondary | ICD-10-CM | POA: Diagnosis not present

## 2024-01-25 DIAGNOSIS — I517 Cardiomegaly: Secondary | ICD-10-CM | POA: Diagnosis not present

## 2024-02-20 DIAGNOSIS — Z96642 Presence of left artificial hip joint: Secondary | ICD-10-CM | POA: Diagnosis not present

## 2024-03-07 ENCOUNTER — Other Ambulatory Visit: Payer: Self-pay | Admitting: Cardiology

## 2024-03-07 DIAGNOSIS — I428 Other cardiomyopathies: Secondary | ICD-10-CM

## 2024-04-03 ENCOUNTER — Other Ambulatory Visit: Payer: Self-pay | Admitting: Cardiology

## 2024-04-18 ENCOUNTER — Telehealth (HOSPITAL_COMMUNITY): Payer: Self-pay | Admitting: Cardiology

## 2024-04-18 NOTE — Telephone Encounter (Signed)
 Called to confirm/remind patient of their appointment at the [facility] on 04/18/2024.   Appointment:   [x] Confirmed  [] Left mess   [] No answer/No voice mail  [] VM Full/unable to leave message  [] Phone not in service  Patient reminded to bring all medications and/or complete list.  Confirmed patient has transportation. Gave directions, instructed to utilize valet parking.

## 2024-04-19 ENCOUNTER — Other Ambulatory Visit (HOSPITAL_COMMUNITY): Payer: Self-pay

## 2024-04-19 ENCOUNTER — Ambulatory Visit (HOSPITAL_COMMUNITY): Payer: Self-pay | Admitting: Cardiology

## 2024-04-19 ENCOUNTER — Encounter (HOSPITAL_COMMUNITY): Payer: Self-pay | Admitting: Cardiology

## 2024-04-19 ENCOUNTER — Ambulatory Visit (HOSPITAL_COMMUNITY)
Admission: RE | Admit: 2024-04-19 | Discharge: 2024-04-19 | Disposition: A | Discharge status: Home / Self Care | Source: Ambulatory Visit | Attending: Cardiology | Admitting: Cardiology

## 2024-04-19 VITALS — BP 92/50 | HR 59 | Wt 249.0 lb

## 2024-04-19 DIAGNOSIS — I110 Hypertensive heart disease with heart failure: Secondary | ICD-10-CM | POA: Insufficient documentation

## 2024-04-19 DIAGNOSIS — Z7984 Long term (current) use of oral hypoglycemic drugs: Secondary | ICD-10-CM | POA: Diagnosis not present

## 2024-04-19 DIAGNOSIS — R55 Syncope and collapse: Secondary | ICD-10-CM | POA: Diagnosis not present

## 2024-04-19 DIAGNOSIS — E785 Hyperlipidemia, unspecified: Secondary | ICD-10-CM | POA: Insufficient documentation

## 2024-04-19 DIAGNOSIS — Z79899 Other long term (current) drug therapy: Secondary | ICD-10-CM | POA: Insufficient documentation

## 2024-04-19 DIAGNOSIS — I428 Other cardiomyopathies: Secondary | ICD-10-CM | POA: Insufficient documentation

## 2024-04-19 DIAGNOSIS — I5022 Chronic systolic (congestive) heart failure: Secondary | ICD-10-CM | POA: Diagnosis not present

## 2024-04-19 LAB — BRAIN NATRIURETIC PEPTIDE: B Natriuretic Peptide: 46.6 pg/mL (ref 0.0–100.0)

## 2024-04-19 LAB — LIPID PANEL
Cholesterol: 109 mg/dL (ref 0–200)
HDL: 47 mg/dL (ref >40)
LDL Cholesterol: 43 mg/dL (ref 0–99)
Total CHOL/HDL Ratio: 2.3 ratio
Triglycerides: 93 mg/dL (ref <150)
VLDL: 19 mg/dL (ref 0–40)

## 2024-04-19 LAB — BASIC METABOLIC PANEL WITH GFR
Anion gap: 10 (ref 5–15)
BUN: 31 mg/dL — ABNORMAL HIGH (ref 8–23)
CO2: 22 mmol/L (ref 22–32)
Calcium: 9.4 mg/dL (ref 8.9–10.3)
Chloride: 106 mmol/L (ref 98–111)
Creatinine, Ser: 1.83 mg/dL — ABNORMAL HIGH (ref 0.61–1.24)
GFR, Estimated: 37 mL/min — ABNORMAL LOW (ref >60)
Glucose, Bld: 108 mg/dL — ABNORMAL HIGH (ref 70–99)
Potassium: 4.8 mmol/L (ref 3.5–5.1)
Sodium: 138 mmol/L (ref 135–145)

## 2024-04-19 MED ORDER — DAPAGLIFLOZIN PROPANEDIOL 10 MG PO TABS: 10.0000 mg | ORAL_TABLET | Freq: Every day | ORAL | 11 refills | Status: AC

## 2024-04-19 NOTE — Patient Instructions (Signed)
 STOP Lasix .  STOP Potassium  START Farxiga 10 mg daily.  Labs done today, your results will be available in MyChart, we will contact you for abnormal readings.  Repeat blood work in 10 days.  Your physician has requested that you have an echocardiogram. Echocardiography is a painless test that uses sound waves to create images of your heart. It provides your doctor with information about the size and shape of your heart and how well your heart's chambers and valves are working. This procedure takes approximately one hour. There are no restrictions for this procedure. Please do NOT wear cologne, perfume, aftershave, or lotions (deodorant is allowed). Please arrive 15 minutes prior to your appointment time.  Please note: We ask at that you not bring children with you during ultrasound (echo/ vascular) testing. Due to room size and safety concerns, children are not allowed in the ultrasound rooms during exams. Our front office staff cannot provide observation of children in our lobby area while testing is being conducted. An adult accompanying a patient to their appointment will only be allowed in the ultrasound room at the discretion of the ultrasound technician under special circumstances. We apologize for any inconvenience.  Your physician recommends that you schedule a follow-up appointment in: 6 months  If you have any questions or concerns before your next appointment please send us  a message through [place] or call our office at [phone].    TO LEAVE A MESSAGE FOR THE NURSE SELECT OPTION 2, PLEASE LEAVE A MESSAGE INCLUDING: YOUR NAME DATE OF BIRTH CALL BACK NUMBER REASON FOR CALL**this is important as we prioritize the call backs  YOU WILL RECEIVE A CALL BACK THE SAME DAY AS LONG AS YOU CALL BEFORE 4:00 PM  At the [facility], you and your health needs are our priority. As part of our continuing mission to provide you with exceptional heart care, we have created  designated Provider Care Teams. These Care Teams include your primary Cardiologist (physician) and Advanced Practice Providers (APPs- Physician Assistants and Nurse Practitioners) who all work together to provide you with the care you need, when you need it.   You may see any of the following providers on your designated Care Team at your next follow up: Dr Jules Oar Dr Peder Bourdon Dr. Alwin Baars Dr. Arta Lark Amy Marijane Shoulders, NP Ruddy Corral, [place] [facility] [place], [place] Dennise Fitz, NP [place] Lee, NP Shaw[place] Dellen, NP Luster Salters, PharmD Bevely Brush, PharmD   Please be sure to bring in all your medications bottles to every appointment.    Thank you for choosing [facility]

## 2024-04-19 NOTE — Telephone Encounter (Signed)
 Left mess on VM to stop Furosemide  over weekend, call our office back next week   Darlis Eisenmenger, MD 04/19/2024  4:44 PM EDT Back to Top    Good LDL. Needs to stop Lasix  with creatinine elevated.  Repeat BMET 10 days.

## 2024-04-20 NOTE — Progress Notes (Signed)
 Patient ID: Kevin Murillo, male   DOB: 1946/11/25, 78 y.o.   MRN: [id number] PCP: Dr. Mamie Searles Cardiology: Dr. Mitzie Anda  Chief complaint: CHF  78 y.o.with history of HTN and nonischemic cardiomyopathy (diagnosed in 2011) presents for cardiology followup.  He had an echo in 1/15, showing that EF remains 25-30% with mild LV dilation.  I had him do a cardiopulmonary exercise test in 3/15 that showed normal functional capacity.  Most recent echo in 4/19 showed EF remains 25-30%.  Repeat LHC/RHC in 4/19 showed no significant coronary disease, mildly elevated LV filling pressure, and CI 2.01.   Echo in 2/20 showed EF 30-35%, normal RV. Echo in 9/21 showed EF 30% with mild LV dilation, diffuse hypokinesis.   In 12/21, he was sitting in a restaurant eating breakfast when he passed out suddenly with no prodrome. He quickly regained consciousness. He was admitted to the hospital.  He was found to have a UTI, Jardiance  was stopped.  His BP was low, ?due to urosepsis/dehydration.  He was also taken off Bidil  and spironolactone . No prior dizzy spells and none since.  Zio patch in 2/22 showed 4% PVCs, short 4 beat NSVT run.  Patient saw Dr. Rodolfo Clan with EP, syncope possibly due to SGLT2 inhibitor-related UTI, Jardiance  has been stopped.   Echo in 4/23 showed EF 30% with global hypokinesis, mildly decreased RV systolic function.   Episode of hypotension with loss of consciousness in 4/24, thought to be dehydrated.  Zio monitor after this showed 1.8% PVCs, 4 short NSVT runs, 1 episode of type 1 2nd degree AVB likely while asleep.   He return for followup of CHF.  Mobility improved post-THR. Not using a cane or walker now.  No further episodes of lightheadedness.  No significant exertional dyspnea or chest pain.  No orthopnea/PND.    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 HTN  2. Left hand neuropathy  3. Nonischemic cardiomyopathy: Echo (6/11) with EF 30-35%, posteroinferior severe hypokinesis, moderate diastolic dysfunction, mild left atrial enlargement, mild LV hypertrophy. LHC (7/11): EF 30-35% with diffuse hypokinesis, no angiographic CAD. SPEP and Fe studies unremarkable, TSH normal.  Repeat echo (1/12) with EF 45%, mild global hypokinesis, mild LVH, normal RV size and systolic function.  Echo (6/14) with EF 20-25%, diffuse hypokinesis, mild LV dilation. Echo (1/15) with mild LV dilation, EF 25-30%, diffuse hypokinesis, prominent apical trabeculation, mildly dilated RV. CPX (3/15) with VO2 max 20.1, VE/VCO2 slope 34.7 (near normal when adjusted to respiratory compensation point) => this CPX showed normal functional capacity for his age.  - Echo (4/19): EF 25-30%, mild MR.  - RHC/LHC (4/19): No significant CAD.  Mean RA 6, PA 46/18 mean 32, mean PCWP 21, CI 2.01, PVR 2.4 WU (pulmonary venous hypertension).  - Echo (2/20): EF 30-35%, moderate diastolic dysfunction, normal RV size and systolic function, moderate LAE.  - Echo (9/21): EF 30%, mild LV dilation, diffuse hypokinesis, normal RV.  - Echo (4/23): EF 30% with global hypokinesis, mildly decreased RV systolic function. 4. Hyperlipidemia  5. Syncope (12/21, 4/24): No prodrome. Suspect dehydrated/orthostatic.  - Zio patch in 2/22 showed 4% PVCs, short 4 beat NSVT run. - Zio monitor (5/24): 1.8% PVCs, 4  short NSVT runs, 1 episode of type 1 2nd degree AVB likely while asleep.  6. OA hips: S/p right THR.   Family History:  Mother died with CHF at 69.   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  Quit smoking in 1996.  Occasional ETOH  Married with 3  children. Wife is now in dementia unit at [facility]    ROS: All systems reviewed and negative except as per HPI.   Current Outpatient Medications  Medication Sig Dispense Refill   carvedilol  (COREG ) 25 MG tablet Take 1 tablet (25 mg total) by mouth 2 (two) times daily with a meal. 180 tablet 0   dapagliflozin propanediol (FARXIGA) 10 MG TABS tablet Take 1 tablet (10 mg total) by mouth daily before breakfast. 30 tablet 11   isosorbide -hydrALAZINE  (BIDIL ) 20-37.5 MG tablet TAKE 1 TABLET BY MOUTH THREE TIMES A DAY 270 tablet 4   Multiple Vitamin (MULTIVITAMIN WITH MINERALS) TABS tablet Take 1 tablet by mouth daily.     sacubitril -valsartan  (ENTRESTO ) 97-103 MG TAKE 1 TABLET BY MOUTH TWICE A DAY 180 tablet 0   simvastatin  (ZOCOR ) 40 MG tablet Take 40 mg by mouth daily.      spironolactone  (ALDACTONE ) 25 MG tablet TAKE 1 TABLET (25 MG TOTAL) BY MOUTH DAILY. 90 tablet 1   No current facility-administered medications for this encounter.   BP (!) 92/50   Pulse (!) 59   Wt 112.9 kg (249 lb)   SpO2 97%   BMI 36.77 kg/m  General: NAD Neck: No JVD, no thyromegaly or thyroid  nodule.  Lungs: Clear to auscultation bilaterally with normal respiratory effort. CV: Nondisplaced PMI.  Heart regular S1/S2, no S3/S4, no murmur.  No peripheral edema.  No carotid bruit.  Normal pedal pulses.  Abdomen: Soft, nontender, no hepatosplenomegaly, no distention.  Skin: Intact without lesions or rashes.  Neurologic: Alert and oriented x 3.  Psych: Normal affect. Extremities: No clubbing or cyanosis.  HEENT: Normal.   Assessment/Plan: 1. Nonischemic cardiomyopathy: Possibly due to viral myocarditis versus HTN.  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 Echo in 4/19 showed EF 25-30%.  No CAD on 4/19 cath, but CI marginal at 2.01 with mildly elevated PCWP.  Echo in 9/21 showed EF fairly stable at 30%.  Echo in 4/23 showed EF 30% with global hypokinesis,  mildly decreased RV systolic function. NYHA class I-II, not volume overloaded on exam.  - He saw Dr. Rodolfo Clan to discuss ICD placement post-syncopal episode.  Syncope likely related to orthostasis rather than arrhythmia.  He decided against ICD placement.    - I will arrange for repeat echo.  - Continue Coreg  25 mg bid.  - Continue spironolactone  25 mg daily.  - Continue Bidil  1 tab tid, no BP room to increase.   - Continue Entresto  97/103 bid.   - Stop Lasix  and KCl, start Farxiga 10 mg daily.  BMET/BNP today and BMET in 10 days.  2. HTN: BP controlled (actually low now).  3. Hyperlipidemia: Check lipids today.  4. Syncope: 2 episodes, most recently in 4/24.  Suspect orthostatic/dehydration.  Zio monitor in 5/24 without definite cause.  - As above, has seen EP and decided against ICD placement.   Followup with APP in 6 months  I spent 31 minutes reviewing records, interviewing/examining patient, and managing orders.   Peder Bourdon 04/20/2024

## 2024-04-23 NOTE — Telephone Encounter (Addendum)
 Pt aware, agreeable, and verbalized understanding Labs scheduled  ----- Message from Peder Bourdon sent at 04/19/2024  4:44 PM EDT ----- Good LDL. Needs to stop Lasix  with creatinine elevated.  Repeat BMET 10 days.

## 2024-05-01 ENCOUNTER — Ambulatory Visit (HOSPITAL_COMMUNITY)
Admission: RE | Admit: 2024-05-01 | Discharge: 2024-05-01 | Disposition: A | Discharge status: Home / Self Care | Source: Ambulatory Visit | Attending: Cardiology | Admitting: Cardiology

## 2024-05-01 DIAGNOSIS — I5022 Chronic systolic (congestive) heart failure: Secondary | ICD-10-CM | POA: Insufficient documentation

## 2024-05-01 LAB — BASIC METABOLIC PANEL WITH GFR
Anion gap: 7 (ref 5–15)
BUN: 25 mg/dL — ABNORMAL HIGH (ref 8–23)
CO2: 22 mmol/L (ref 22–32)
Calcium: 9.1 mg/dL (ref 8.9–10.3)
Chloride: 106 mmol/L (ref 98–111)
Creatinine, Ser: 1.6 mg/dL — ABNORMAL HIGH (ref 0.61–1.24)
GFR, Estimated: 44 mL/min — ABNORMAL LOW (ref >60)
Glucose, Bld: 92 mg/dL (ref 70–99)
Potassium: 4.1 mmol/L (ref 3.5–5.1)
Sodium: 135 mmol/L (ref 135–145)

## 2024-05-28 DIAGNOSIS — H43392 Other vitreous opacities, left eye: Secondary | ICD-10-CM | POA: Diagnosis not present

## 2024-05-28 DIAGNOSIS — H2513 Age-related nuclear cataract, bilateral: Secondary | ICD-10-CM | POA: Diagnosis not present

## 2024-05-28 DIAGNOSIS — H02834 Dermatochalasis of left upper eyelid: Secondary | ICD-10-CM | POA: Diagnosis not present

## 2024-05-28 DIAGNOSIS — H02831 Dermatochalasis of right upper eyelid: Secondary | ICD-10-CM | POA: Diagnosis not present

## 2024-06-05 ENCOUNTER — Other Ambulatory Visit: Payer: Self-pay | Admitting: Cardiology

## 2024-06-06 ENCOUNTER — Telehealth (HOSPITAL_COMMUNITY): Payer: Self-pay

## 2024-06-06 ENCOUNTER — Other Ambulatory Visit (HOSPITAL_COMMUNITY): Payer: Self-pay

## 2024-06-06 NOTE — Telephone Encounter (Signed)
 Advanced Heart Failure Patient Advocate Encounter  Prior authorization for Entresto  has been submitted and approved. Test billing returns $47 for 30 day supply.  Key: [id number] Effective: 06/06/2024 to 06/06/2025  Rachel DEL, CPhT Rx Patient Advocate Phone: [phone]

## 2024-06-13 ENCOUNTER — Ambulatory Visit (HOSPITAL_COMMUNITY)
Admission: RE | Admit: 2024-06-13 | Discharge: 2024-06-13 | Disposition: A | Discharge status: Home / Self Care | Source: Ambulatory Visit | Attending: Cardiology | Admitting: Cardiology

## 2024-06-13 DIAGNOSIS — I493 Ventricular premature depolarization: Secondary | ICD-10-CM | POA: Diagnosis not present

## 2024-06-13 DIAGNOSIS — I429 Cardiomyopathy, unspecified: Secondary | ICD-10-CM | POA: Diagnosis not present

## 2024-06-13 DIAGNOSIS — I110 Hypertensive heart disease with heart failure: Secondary | ICD-10-CM | POA: Diagnosis not present

## 2024-06-13 DIAGNOSIS — I5022 Chronic systolic (congestive) heart failure: Secondary | ICD-10-CM | POA: Diagnosis not present

## 2024-06-13 DIAGNOSIS — E785 Hyperlipidemia, unspecified: Secondary | ICD-10-CM | POA: Diagnosis not present

## 2024-06-13 LAB — ECHOCARDIOGRAM COMPLETE
Area-P 1/2: 3.77 cm2
S' Lateral: 4.6 cm
Single Plane A4C EF: 35.8 %

## 2024-06-13 NOTE — Progress Notes (Signed)
  Echocardiogram 2D Echocardiogram has been performed.  Kevin Murillo 06/13/2024, 10:47 AM

## 2024-08-13 DIAGNOSIS — M25531 Pain in right wrist: Secondary | ICD-10-CM | POA: Diagnosis not present

## 2024-08-13 DIAGNOSIS — N1832 Chronic kidney disease, stage 3b: Secondary | ICD-10-CM | POA: Diagnosis not present

## 2024-08-13 DIAGNOSIS — I5022 Chronic systolic (congestive) heart failure: Secondary | ICD-10-CM | POA: Diagnosis not present

## 2024-08-13 DIAGNOSIS — I130 Hypertensive heart and chronic kidney disease with heart failure and stage 1 through stage 4 chronic kidney disease, or unspecified chronic kidney disease: Secondary | ICD-10-CM | POA: Diagnosis not present

## 2024-08-30 ENCOUNTER — Other Ambulatory Visit: Payer: Self-pay

## 2024-08-30 ENCOUNTER — Emergency Department (HOSPITAL_COMMUNITY)
Admission: EM | Admit: 2024-08-30 | Discharge: 2024-08-30 | Disposition: A | Discharge status: Home / Self Care | Attending: Emergency Medicine | Admitting: Emergency Medicine

## 2024-08-30 ENCOUNTER — Emergency Department (HOSPITAL_COMMUNITY)

## 2024-08-30 ENCOUNTER — Encounter (HOSPITAL_COMMUNITY): Payer: Self-pay | Admitting: Emergency Medicine

## 2024-08-30 DIAGNOSIS — I509 Heart failure, unspecified: Secondary | ICD-10-CM | POA: Insufficient documentation

## 2024-08-30 DIAGNOSIS — S7012XA Contusion of left thigh, initial encounter: Secondary | ICD-10-CM | POA: Insufficient documentation

## 2024-08-30 DIAGNOSIS — Z041 Encounter for examination and observation following transport accident: Secondary | ICD-10-CM | POA: Diagnosis not present

## 2024-08-30 DIAGNOSIS — Z96642 Presence of left artificial hip joint: Secondary | ICD-10-CM | POA: Diagnosis not present

## 2024-08-30 DIAGNOSIS — Y92410 Unspecified street and highway as the place of occurrence of the external cause: Secondary | ICD-10-CM | POA: Diagnosis not present

## 2024-08-30 DIAGNOSIS — M79652 Pain in left thigh: Secondary | ICD-10-CM | POA: Diagnosis present

## 2024-08-30 DIAGNOSIS — M25462 Effusion, left knee: Secondary | ICD-10-CM | POA: Diagnosis not present

## 2024-08-30 MED ORDER — ONDANSETRON 4 MG PO TBDP
4.0000 mg | ORAL_TABLET | Freq: Once | ORAL | Status: AC
  Administered 2024-08-30: 4 mg via ORAL
  Filled 2024-08-30: qty 1

## 2024-08-30 MED ORDER — CYCLOBENZAPRINE HCL 10 MG PO TABS
5.0000 mg | ORAL_TABLET | Freq: Once | ORAL | Status: AC
  Administered 2024-08-30: 5 mg via ORAL
  Filled 2024-08-30: qty 1

## 2024-08-30 MED ORDER — METHOCARBAMOL 500 MG PO TABS: 500.0000 mg | ORAL_TABLET | Freq: Three times a day (TID) | ORAL | 0 refills | Status: DC | PRN

## 2024-08-30 MED ORDER — ACETAMINOPHEN 325 MG PO TABS
650.0000 mg | ORAL_TABLET | Freq: Once | ORAL | Status: AC
  Administered 2024-08-30: 650 mg via ORAL
  Filled 2024-08-30: qty 2
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
## 2024-08-30 NOTE — Discharge Instructions (Addendum)
 As we did test your x-ray did not show any fracture  You likely will be stiff and sore  Take Motrin for pain and Robaxin  for muscle spasms  Please follow-up with your doctor  Return to ER if you have worse leg pain or trouble walking

## 2024-08-31 ENCOUNTER — Emergency Department (HOSPITAL_COMMUNITY)
Admission: EM | Admit: 2024-08-31 | Discharge: 2024-09-01 | Disposition: A | Attending: Emergency Medicine | Admitting: Emergency Medicine

## 2024-08-31 ENCOUNTER — Other Ambulatory Visit: Payer: Self-pay

## 2024-08-31 ENCOUNTER — Encounter (HOSPITAL_COMMUNITY): Payer: Self-pay | Admitting: Emergency Medicine

## 2024-08-31 DIAGNOSIS — I959 Hypotension, unspecified: Secondary | ICD-10-CM | POA: Diagnosis not present

## 2024-08-31 DIAGNOSIS — R42 Dizziness and giddiness: Secondary | ICD-10-CM | POA: Diagnosis not present

## 2024-08-31 DIAGNOSIS — R55 Syncope and collapse: Secondary | ICD-10-CM | POA: Insufficient documentation

## 2024-08-31 DIAGNOSIS — M25462 Effusion, left knee: Secondary | ICD-10-CM | POA: Diagnosis not present

## 2024-08-31 DIAGNOSIS — E860 Dehydration: Secondary | ICD-10-CM | POA: Diagnosis not present

## 2024-08-31 DIAGNOSIS — S80919A Unspecified superficial injury of unspecified knee, initial encounter: Secondary | ICD-10-CM | POA: Diagnosis not present

## 2024-08-31 DIAGNOSIS — I952 Hypotension due to drugs: Secondary | ICD-10-CM | POA: Diagnosis not present

## 2024-08-31 DIAGNOSIS — R110 Nausea: Secondary | ICD-10-CM | POA: Diagnosis not present

## 2024-08-31 MED ORDER — LACTATED RINGERS IV BOLUS
1000.0000 mL | Freq: Once | INTRAVENOUS | Status: AC
  Administered 2024-09-01: 1000 mL via INTRAVENOUS
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
## 2024-09-01 ENCOUNTER — Emergency Department (HOSPITAL_COMMUNITY)

## 2024-09-01 DIAGNOSIS — R55 Syncope and collapse: Secondary | ICD-10-CM | POA: Diagnosis not present

## 2024-09-01 DIAGNOSIS — M1712 Unilateral primary osteoarthritis, left knee: Secondary | ICD-10-CM | POA: Diagnosis not present

## 2024-09-01 DIAGNOSIS — M25562 Pain in left knee: Secondary | ICD-10-CM | POA: Diagnosis not present

## 2024-09-01 DIAGNOSIS — M25462 Effusion, left knee: Secondary | ICD-10-CM | POA: Diagnosis not present

## 2024-09-01 LAB — CBC WITH DIFFERENTIAL/PLATELET
Abs Immature Granulocytes: 0.03 K/uL (ref 0.00–0.07)
Basophils Absolute: 0 K/uL (ref 0.0–0.1)
Basophils Relative: 0 %
Eosinophils Absolute: 0 K/uL (ref 0.0–0.5)
Eosinophils Relative: 0 %
HCT: 37.7 % — ABNORMAL LOW (ref 39.0–52.0)
Hemoglobin: 11.8 g/dL — ABNORMAL LOW (ref 13.0–17.0)
Immature Granulocytes: 0 %
Lymphocytes Relative: 16 %
Lymphs Abs: 1.5 K/uL (ref 0.7–4.0)
MCH: 29.9 pg (ref 26.0–34.0)
MCHC: 31.3 g/dL (ref 30.0–36.0)
MCV: 95.4 fL (ref 80.0–100.0)
Monocytes Absolute: 1.2 K/uL — ABNORMAL HIGH (ref 0.1–1.0)
Monocytes Relative: 13 %
Neutro Abs: 6.5 K/uL (ref 1.7–7.7)
Neutrophils Relative %: 71 %
Platelets: 169 K/uL (ref 150–400)
RBC: 3.95 MIL/uL — ABNORMAL LOW (ref 4.22–5.81)
RDW: 13.2 % (ref 11.5–15.5)
WBC: 9.3 K/uL (ref 4.0–10.5)
nRBC: 0 % (ref 0.0–0.2)

## 2024-09-01 LAB — BASIC METABOLIC PANEL WITH GFR
Anion gap: 12 (ref 5–15)
BUN: 30 mg/dL — ABNORMAL HIGH (ref 8–23)
CO2: 23 mmol/L (ref 22–32)
Calcium: 9 mg/dL (ref 8.9–10.3)
Chloride: 104 mmol/L (ref 98–111)
Creatinine, Ser: 1.96 mg/dL — ABNORMAL HIGH (ref 0.61–1.24)
GFR, Estimated: 34 mL/min — ABNORMAL LOW
Glucose, Bld: 147 mg/dL — ABNORMAL HIGH (ref 70–99)
Potassium: 4.3 mmol/L (ref 3.5–5.1)
Sodium: 139 mmol/L (ref 135–145)

## 2024-09-01 LAB — TROPONIN I (HIGH SENSITIVITY): Troponin I (High Sensitivity): 19 ng/L — ABNORMAL HIGH (ref <18)

## 2024-09-01 MED ORDER — LACTATED RINGERS IV BOLUS
1000.0000 mL | Freq: Once | INTRAVENOUS | Status: AC
  Administered 2024-09-01: 1000 mL via INTRAVENOUS

## 2024-09-01 NOTE — ED Notes (Signed)
Patient verbalizes understanding of discharge instructions. Opportunity for questioning and answers were provided. Armband removed by staff, pt discharged from ED. Wheeled out to car with son
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
## 2024-09-01 NOTE — Progress Notes (Signed)
 Orthopedic Tech Progress Note Patient Details:  Kevin Murillo [birth year] [id number]  Ortho Devices Type of Ortho Device: Knee Immobilizer Ortho Device/Splint Location: lle Ortho Device/Splint Interventions: Ordered, Application, Adjustment   Post Interventions Patient Tolerated: Well Instructions Provided: Care of device, Adjustment of device  Chandra Dorn PARAS 09/01/2024, 6:18 AM

## 2024-09-01 NOTE — Discharge Instructions (Signed)
 Your knee x-ray showed fluid on the knee with a significant amount of arthritis.  I recommend that you follow-up with orthopedics as listed above.  Your kidney function was slightly decreased compared to your most recent on file.  This is likely due to some dehydration.  Your blood pressure improved nicely after fluids.  Be certain that you are getting enough to drink and eating appropriately.  Please follow-up with your regular doctor.

## 2024-09-02 ENCOUNTER — Other Ambulatory Visit: Payer: Self-pay

## 2024-09-02 ENCOUNTER — Emergency Department (HOSPITAL_COMMUNITY)

## 2024-09-02 ENCOUNTER — Observation Stay (HOSPITAL_COMMUNITY)

## 2024-09-02 ENCOUNTER — Inpatient Hospital Stay (HOSPITAL_COMMUNITY)
Admission: EM | Admit: 2024-09-02 | Discharge: 2024-09-07 | DRG: 312 | Disposition: A | Discharge status: Skilled Nursing Facility | Attending: Internal Medicine | Admitting: Internal Medicine

## 2024-09-02 ENCOUNTER — Encounter (HOSPITAL_COMMUNITY): Payer: Self-pay

## 2024-09-02 DIAGNOSIS — R55 Syncope and collapse: Principal | ICD-10-CM | POA: Diagnosis present

## 2024-09-02 DIAGNOSIS — M1712 Unilateral primary osteoarthritis, left knee: Secondary | ICD-10-CM | POA: Diagnosis present

## 2024-09-02 DIAGNOSIS — M25562 Pain in left knee: Secondary | ICD-10-CM | POA: Diagnosis not present

## 2024-09-02 DIAGNOSIS — R531 Weakness: Secondary | ICD-10-CM | POA: Diagnosis not present

## 2024-09-02 DIAGNOSIS — T428X5A Adverse effect of antiparkinsonism drugs and other central muscle-tone depressants, initial encounter: Secondary | ICD-10-CM | POA: Diagnosis present

## 2024-09-02 DIAGNOSIS — S83412A Sprain of medial collateral ligament of left knee, initial encounter: Secondary | ICD-10-CM | POA: Diagnosis not present

## 2024-09-02 DIAGNOSIS — I959 Hypotension, unspecified: Secondary | ICD-10-CM | POA: Diagnosis not present

## 2024-09-02 DIAGNOSIS — M109 Gout, unspecified: Secondary | ICD-10-CM | POA: Diagnosis present

## 2024-09-02 DIAGNOSIS — N1832 Chronic kidney disease, stage 3b: Secondary | ICD-10-CM | POA: Diagnosis present

## 2024-09-02 DIAGNOSIS — S8992XA Unspecified injury of left lower leg, initial encounter: Secondary | ICD-10-CM | POA: Diagnosis not present

## 2024-09-02 DIAGNOSIS — D649 Anemia, unspecified: Secondary | ICD-10-CM | POA: Diagnosis not present

## 2024-09-02 DIAGNOSIS — I5022 Chronic systolic (congestive) heart failure: Secondary | ICD-10-CM | POA: Diagnosis present

## 2024-09-02 DIAGNOSIS — Z8249 Family history of ischemic heart disease and other diseases of the circulatory system: Secondary | ICD-10-CM

## 2024-09-02 DIAGNOSIS — Z860101 Personal history of adenomatous and serrated colon polyps: Secondary | ICD-10-CM

## 2024-09-02 DIAGNOSIS — M25461 Effusion, right knee: Secondary | ICD-10-CM | POA: Diagnosis present

## 2024-09-02 DIAGNOSIS — Z87891 Personal history of nicotine dependence: Secondary | ICD-10-CM

## 2024-09-02 DIAGNOSIS — I130 Hypertensive heart and chronic kidney disease with heart failure and stage 1 through stage 4 chronic kidney disease, or unspecified chronic kidney disease: Secondary | ICD-10-CM | POA: Diagnosis present

## 2024-09-02 DIAGNOSIS — I491 Atrial premature depolarization: Secondary | ICD-10-CM | POA: Diagnosis not present

## 2024-09-02 DIAGNOSIS — I952 Hypotension due to drugs: Principal | ICD-10-CM | POA: Diagnosis present

## 2024-09-02 DIAGNOSIS — Z79899 Other long term (current) drug therapy: Secondary | ICD-10-CM

## 2024-09-02 DIAGNOSIS — I428 Other cardiomyopathies: Secondary | ICD-10-CM | POA: Diagnosis present

## 2024-09-02 DIAGNOSIS — I5020 Unspecified systolic (congestive) heart failure: Secondary | ICD-10-CM | POA: Diagnosis present

## 2024-09-02 DIAGNOSIS — N390 Urinary tract infection, site not specified: Secondary | ICD-10-CM | POA: Diagnosis present

## 2024-09-02 DIAGNOSIS — M25462 Effusion, left knee: Secondary | ICD-10-CM | POA: Diagnosis present

## 2024-09-02 DIAGNOSIS — M7122 Synovial cyst of popliteal space [Baker], left knee: Secondary | ICD-10-CM | POA: Diagnosis not present

## 2024-09-02 DIAGNOSIS — Z823 Family history of stroke: Secondary | ICD-10-CM

## 2024-09-02 DIAGNOSIS — N183 Chronic kidney disease, stage 3 unspecified: Secondary | ICD-10-CM | POA: Diagnosis present

## 2024-09-02 DIAGNOSIS — E669 Obesity, unspecified: Secondary | ICD-10-CM | POA: Diagnosis present

## 2024-09-02 DIAGNOSIS — Z87828 Personal history of other (healed) physical injury and trauma: Secondary | ICD-10-CM | POA: Diagnosis not present

## 2024-09-02 DIAGNOSIS — E860 Dehydration: Secondary | ICD-10-CM | POA: Diagnosis present

## 2024-09-02 DIAGNOSIS — N3001 Acute cystitis with hematuria: Secondary | ICD-10-CM

## 2024-09-02 DIAGNOSIS — Z96643 Presence of artificial hip joint, bilateral: Secondary | ICD-10-CM | POA: Diagnosis present

## 2024-09-02 DIAGNOSIS — E785 Hyperlipidemia, unspecified: Secondary | ICD-10-CM | POA: Diagnosis present

## 2024-09-02 DIAGNOSIS — R61 Generalized hyperhidrosis: Secondary | ICD-10-CM | POA: Diagnosis not present

## 2024-09-02 LAB — CBC WITH DIFFERENTIAL/PLATELET
Abs Immature Granulocytes: 0.02 K/uL (ref 0.00–0.07)
Basophils Absolute: 0 K/uL (ref 0.0–0.1)
Basophils Relative: 0 %
Eosinophils Absolute: 0.1 K/uL (ref 0.0–0.5)
Eosinophils Relative: 1 %
HCT: 37.9 % — ABNORMAL LOW (ref 39.0–52.0)
Hemoglobin: 12 g/dL — ABNORMAL LOW (ref 13.0–17.0)
Immature Granulocytes: 0 %
Lymphocytes Relative: 20 %
Lymphs Abs: 1.9 K/uL (ref 0.7–4.0)
MCH: 30.2 pg (ref 26.0–34.0)
MCHC: 31.7 g/dL (ref 30.0–36.0)
MCV: 95.2 fL (ref 80.0–100.0)
Monocytes Absolute: 1.3 K/uL — ABNORMAL HIGH (ref 0.1–1.0)
Monocytes Relative: 13 %
Neutro Abs: 6.4 K/uL (ref 1.7–7.7)
Neutrophils Relative %: 66 %
Platelets: 167 K/uL (ref 150–400)
RBC: 3.98 MIL/uL — ABNORMAL LOW (ref 4.22–5.81)
RDW: 13.2 % (ref 11.5–15.5)
WBC: 9.6 K/uL (ref 4.0–10.5)
nRBC: 0 % (ref 0.0–0.2)

## 2024-09-02 LAB — URINALYSIS, ROUTINE W REFLEX MICROSCOPIC
Bilirubin Urine: NEGATIVE
Glucose, UA: >=500 mg/dL — AB
Ketones, ur: NEGATIVE mg/dL
Nitrite: NEGATIVE
Protein, ur: 30 mg/dL — AB
Specific Gravity, Urine: 1.008 (ref 1.005–1.030)
WBC, UA: >50 WBC/hpf (ref 0–5)
pH: 5 (ref 5.0–8.0)

## 2024-09-02 LAB — COMPREHENSIVE METABOLIC PANEL WITH GFR
ALT: 19 U/L (ref 0–44)
AST: 23 U/L (ref 15–41)
Albumin: 3.1 g/dL — ABNORMAL LOW (ref 3.5–5.0)
Alkaline Phosphatase: 39 U/L (ref 38–126)
Anion gap: 10 (ref 5–15)
BUN: 27 mg/dL — ABNORMAL HIGH (ref 8–23)
CO2: 22 mmol/L (ref 22–32)
Calcium: 8.9 mg/dL (ref 8.9–10.3)
Chloride: 104 mmol/L (ref 98–111)
Creatinine, Ser: 1.61 mg/dL — ABNORMAL HIGH (ref 0.61–1.24)
GFR, Estimated: 43 mL/min — ABNORMAL LOW (ref >60)
Glucose, Bld: 103 mg/dL — ABNORMAL HIGH (ref 70–99)
Potassium: 4.4 mmol/L (ref 3.5–5.1)
Sodium: 136 mmol/L (ref 135–145)
Total Bilirubin: 1.8 mg/dL — ABNORMAL HIGH (ref 0.0–1.2)
Total Protein: 6.8 g/dL (ref 6.5–8.1)

## 2024-09-02 MED ORDER — SIMVASTATIN 20 MG PO TABS
40.0000 mg | ORAL_TABLET | Freq: Every day | ORAL | Status: DC
  Administered 2024-09-03 – 2024-09-07 (×6): 40 mg via ORAL
  Filled 2024-09-02 (×6): qty 2

## 2024-09-02 MED ORDER — SODIUM CHLORIDE 0.9% FLUSH
3.0000 mL | Freq: Two times a day (BID) | INTRAVENOUS | Status: DC
  Administered 2024-09-02 – 2024-09-07 (×10): 3 mL via INTRAVENOUS

## 2024-09-02 MED ORDER — BUPIVACAINE HCL (PF) 0.5 % IJ SOLN
10.0000 mL | Freq: Once | INTRAMUSCULAR | Status: AC
Start: 2024-09-02 — End: 2024-09-02
  Administered 2024-09-02: 10 mL
  Filled 2024-09-02: qty 10

## 2024-09-02 MED ORDER — ACETAMINOPHEN 650 MG RE SUPP: 650.0000 mg | Freq: Four times a day (QID) | RECTAL | Status: DC | PRN

## 2024-09-02 MED ORDER — HYDROCODONE-ACETAMINOPHEN 5-325 MG PO TABS
1.0000 | ORAL_TABLET | Freq: Once | ORAL | Status: AC
  Administered 2024-09-02: 1 via ORAL
  Filled 2024-09-02: qty 1

## 2024-09-02 MED ORDER — ALBUTEROL SULFATE (2.5 MG/3ML) 0.083% IN NEBU: 2.5000 mg | INHALATION_SOLUTION | Freq: Four times a day (QID) | RESPIRATORY_TRACT | Status: DC | PRN

## 2024-09-02 MED ORDER — ONDANSETRON HCL 4 MG/2ML IJ SOLN
4.0000 mg | Freq: Four times a day (QID) | INTRAMUSCULAR | Status: DC | PRN
  Administered 2024-09-03: 4 mg via INTRAVENOUS
  Filled 2024-09-02: qty 2

## 2024-09-02 MED ORDER — HYDROCODONE-ACETAMINOPHEN 5-325 MG PO TABS
1.0000 | ORAL_TABLET | Freq: Four times a day (QID) | ORAL | Status: DC | PRN
  Administered 2024-09-03 – 2024-09-07 (×9): 1 via ORAL
  Filled 2024-09-02 (×9): qty 1

## 2024-09-02 MED ORDER — SODIUM CHLORIDE 0.9 % IV SOLN
2.0000 g | INTRAVENOUS | Status: DC
  Administered 2024-09-02 – 2024-09-06 (×5): 2 g via INTRAVENOUS
  Filled 2024-09-02 (×5): qty 20

## 2024-09-02 MED ORDER — SODIUM CHLORIDE 0.9 % IV BOLUS
1000.0000 mL | Freq: Once | INTRAVENOUS | Status: AC
Start: 2024-09-02 — End: 2024-09-02
  Administered 2024-09-02: 1000 mL via INTRAVENOUS

## 2024-09-02 MED ORDER — ACETAMINOPHEN 325 MG PO TABS
650.0000 mg | ORAL_TABLET | Freq: Four times a day (QID) | ORAL | Status: DC | PRN
  Administered 2024-09-03 – 2024-09-06 (×2): 650 mg via ORAL
  Filled 2024-09-02 (×3): qty 2

## 2024-09-02 MED ORDER — ENOXAPARIN SODIUM 40 MG/0.4ML IJ SOSY
40.0000 mg | PREFILLED_SYRINGE | INTRAMUSCULAR | Status: DC
  Administered 2024-09-02 – 2024-09-07 (×6): 40 mg via SUBCUTANEOUS
  Filled 2024-09-02 (×6): qty 0.4

## 2024-09-02 NOTE — ED Notes (Signed)
 CCMD called.

## 2024-09-02 NOTE — ED Notes (Signed)
 CCMD called, pt on monitor

## 2024-09-02 NOTE — ED Notes (Signed)
 Pt urinated in bed. Nurse and this nurse tech changed entire bed, placed purewick, cleaned patient, and elevated bed.

## 2024-09-02 NOTE — Procedures (Signed)
 Procedure: Left knee aspiration and injection   Indication: Left knee effusion(s)   Surgeon: Ozell Ned, PA-C   Assist: None   Anesthesia: Topical refrigerant   EBL: None   Complications: None   Findings: After risks/benefits explained patient desires to undergo procedure. Consent obtained and time out performed. The left knee was sterilely prepped and aspirated. bloody fluid obtained. 6ml 0.5% Marcaine  instilled. Pt tolerated the procedure well.       [name], PA-C Orthopedic Surgery [phone]
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
## 2024-09-02 NOTE — ED Notes (Signed)
 X-ray at bedside.

## 2024-09-03 DIAGNOSIS — R531 Weakness: Secondary | ICD-10-CM | POA: Diagnosis not present

## 2024-09-03 DIAGNOSIS — Z8249 Family history of ischemic heart disease and other diseases of the circulatory system: Secondary | ICD-10-CM | POA: Diagnosis not present

## 2024-09-03 DIAGNOSIS — T428X5A Adverse effect of antiparkinsonism drugs and other central muscle-tone depressants, initial encounter: Secondary | ICD-10-CM | POA: Diagnosis not present

## 2024-09-03 DIAGNOSIS — M109 Gout, unspecified: Secondary | ICD-10-CM | POA: Diagnosis not present

## 2024-09-03 DIAGNOSIS — Z79899 Other long term (current) drug therapy: Secondary | ICD-10-CM | POA: Diagnosis not present

## 2024-09-03 DIAGNOSIS — H2513 Age-related nuclear cataract, bilateral: Secondary | ICD-10-CM | POA: Diagnosis not present

## 2024-09-03 DIAGNOSIS — Z823 Family history of stroke: Secondary | ICD-10-CM | POA: Diagnosis not present

## 2024-09-03 DIAGNOSIS — Z6835 Body mass index (BMI) 35.0-35.9, adult: Secondary | ICD-10-CM | POA: Diagnosis not present

## 2024-09-03 DIAGNOSIS — E669 Obesity, unspecified: Secondary | ICD-10-CM | POA: Diagnosis not present

## 2024-09-03 DIAGNOSIS — I959 Hypotension, unspecified: Secondary | ICD-10-CM | POA: Diagnosis not present

## 2024-09-03 DIAGNOSIS — Z7401 Bed confinement status: Secondary | ICD-10-CM | POA: Diagnosis not present

## 2024-09-03 DIAGNOSIS — R55 Syncope and collapse: Secondary | ICD-10-CM | POA: Diagnosis present

## 2024-09-03 DIAGNOSIS — M25462 Effusion, left knee: Secondary | ICD-10-CM | POA: Diagnosis not present

## 2024-09-03 DIAGNOSIS — M25562 Pain in left knee: Secondary | ICD-10-CM | POA: Diagnosis not present

## 2024-09-03 DIAGNOSIS — M1712 Unilateral primary osteoarthritis, left knee: Secondary | ICD-10-CM | POA: Diagnosis not present

## 2024-09-03 DIAGNOSIS — Z87891 Personal history of nicotine dependence: Secondary | ICD-10-CM | POA: Diagnosis not present

## 2024-09-03 DIAGNOSIS — E860 Dehydration: Secondary | ICD-10-CM | POA: Diagnosis not present

## 2024-09-03 DIAGNOSIS — Z860101 Personal history of adenomatous and serrated colon polyps: Secondary | ICD-10-CM | POA: Diagnosis not present

## 2024-09-03 DIAGNOSIS — I493 Ventricular premature depolarization: Secondary | ICD-10-CM | POA: Diagnosis not present

## 2024-09-03 DIAGNOSIS — M25561 Pain in right knee: Secondary | ICD-10-CM | POA: Diagnosis not present

## 2024-09-03 DIAGNOSIS — E8809 Other disorders of plasma-protein metabolism, not elsewhere classified: Secondary | ICD-10-CM | POA: Diagnosis not present

## 2024-09-03 DIAGNOSIS — N390 Urinary tract infection, site not specified: Secondary | ICD-10-CM | POA: Diagnosis not present

## 2024-09-03 DIAGNOSIS — I5022 Chronic systolic (congestive) heart failure: Secondary | ICD-10-CM | POA: Diagnosis not present

## 2024-09-03 DIAGNOSIS — N1832 Chronic kidney disease, stage 3b: Secondary | ICD-10-CM | POA: Diagnosis not present

## 2024-09-03 DIAGNOSIS — I130 Hypertensive heart and chronic kidney disease with heart failure and stage 1 through stage 4 chronic kidney disease, or unspecified chronic kidney disease: Secondary | ICD-10-CM | POA: Diagnosis not present

## 2024-09-03 DIAGNOSIS — Z96643 Presence of artificial hip joint, bilateral: Secondary | ICD-10-CM | POA: Diagnosis not present

## 2024-09-03 DIAGNOSIS — M6281 Muscle weakness (generalized): Secondary | ICD-10-CM | POA: Diagnosis not present

## 2024-09-03 DIAGNOSIS — M25461 Effusion, right knee: Secondary | ICD-10-CM | POA: Diagnosis not present

## 2024-09-03 DIAGNOSIS — I952 Hypotension due to drugs: Secondary | ICD-10-CM | POA: Diagnosis not present

## 2024-09-03 DIAGNOSIS — M160 Bilateral primary osteoarthritis of hip: Secondary | ICD-10-CM | POA: Diagnosis not present

## 2024-09-03 DIAGNOSIS — E785 Hyperlipidemia, unspecified: Secondary | ICD-10-CM | POA: Diagnosis not present

## 2024-09-03 DIAGNOSIS — D649 Anemia, unspecified: Secondary | ICD-10-CM | POA: Diagnosis not present

## 2024-09-03 DIAGNOSIS — I10 Essential (primary) hypertension: Secondary | ICD-10-CM | POA: Diagnosis not present

## 2024-09-03 DIAGNOSIS — G629 Polyneuropathy, unspecified: Secondary | ICD-10-CM | POA: Diagnosis not present

## 2024-09-03 DIAGNOSIS — I420 Dilated cardiomyopathy: Secondary | ICD-10-CM | POA: Diagnosis not present

## 2024-09-03 DIAGNOSIS — I428 Other cardiomyopathies: Secondary | ICD-10-CM | POA: Diagnosis not present

## 2024-09-03 DIAGNOSIS — S72432D Displaced fracture of medial condyle of left femur, subsequent encounter for closed fracture with routine healing: Secondary | ICD-10-CM | POA: Diagnosis not present

## 2024-09-03 LAB — BASIC METABOLIC PANEL WITH GFR
Anion gap: 8 (ref 5–15)
BUN: 23 mg/dL (ref 8–23)
CO2: 25 mmol/L (ref 22–32)
Calcium: 8.8 mg/dL — ABNORMAL LOW (ref 8.9–10.3)
Chloride: 104 mmol/L (ref 98–111)
Creatinine, Ser: 1.36 mg/dL — ABNORMAL HIGH (ref 0.61–1.24)
GFR, Estimated: 53 mL/min — ABNORMAL LOW (ref >60)
Glucose, Bld: 100 mg/dL — ABNORMAL HIGH (ref 70–99)
Potassium: 4 mmol/L (ref 3.5–5.1)
Sodium: 137 mmol/L (ref 135–145)

## 2024-09-03 LAB — CBC
HCT: 37.1 % — ABNORMAL LOW (ref 39.0–52.0)
Hemoglobin: 12 g/dL — ABNORMAL LOW (ref 13.0–17.0)
MCH: 30.2 pg (ref 26.0–34.0)
MCHC: 32.3 g/dL (ref 30.0–36.0)
MCV: 93.5 fL (ref 80.0–100.0)
Platelets: 184 K/uL (ref 150–400)
RBC: 3.97 MIL/uL — ABNORMAL LOW (ref 4.22–5.81)
RDW: 13.3 % (ref 11.5–15.5)
WBC: 8.9 K/uL (ref 4.0–10.5)
nRBC: 0 % (ref 0.0–0.2)

## 2024-09-03 MED ORDER — MIDODRINE HCL 5 MG PO TABS
10.0000 mg | ORAL_TABLET | Freq: Once | ORAL | Status: AC
  Administered 2024-09-03: 10 mg via ORAL
  Filled 2024-09-03: qty 2
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
## 2024-09-03 NOTE — NC FL2 (Signed)
 [place]  MEDICAID FL2 LEVEL OF CARE FORM     IDENTIFICATION  Patient Name: Kevin Murillo Birthdate: 04-02-1946 Sex: male Admission Date (Current Location): 09/02/2024  [facility] and [place] Number:  Producer, television/film/video and Address:  [facility], [address] [place], [place] [address]      Provider Number: [id number]  Attending Physician Name and Address:  Dino Antu, MD  Relative Name and Phone Number:       Current Level of Care: Hospital Recommended Level of Care: Skilled Nursing Facility Prior Approval Number:    Date Approved/Denied:   PASRR Number: [id number]  Discharge Plan: SNF    Current Diagnoses: Patient Active Problem List   Diagnosis Date Noted   Near syncope 09/03/2024   Syncope 09/02/2024   Left knee pain 09/02/2024   Heart failure with reduced ejection fraction (HCC) 09/02/2024   Urinary tract infection 09/02/2024   Normocytic anemia 09/02/2024   Chronic kidney disease, stage III (moderate) (HCC) 09/02/2024   Transient hypotension 09/02/2024   Osteoarthritis of left hip 02/09/2023   Syncope and collapse 11/26/2020   Osteoarthritis of right hip 11/27/2019   Primary osteoarthritis of right hip 11/27/2019   Dilated cardiomyopathy (HCC) 05/07/2013   History of colonic polyps 11/04/2012   HYPERLIPIDEMIA-MIXED 06/16/2010   Essential hypertension 05/27/2010   LEFT HEART FAILURE 05/27/2010    Orientation RESPIRATION BLADDER Height & Weight     Self, Time, Situation, Place  Normal Continent Weight: 235 lb (106.6 kg) Height:  5' 9 (175.3 cm)  BEHAVIORAL SYMPTOMS/MOOD NEUROLOGICAL BOWEL NUTRITION STATUS      Continent Diet (see d/c summary)  AMBULATORY STATUS COMMUNICATION OF NEEDS Skin   Extensive Assist Verbally Normal                       Personal Care Assistance Level of Assistance  Bathing, Dressing, Feeding Bathing Assistance: Maximum assistance Feeding assistance: Independent Dressing  Assistance: Maximum assistance     Functional Limitations Info  Sight, Hearing, Speech Sight Info: Adequate Hearing Info: Adequate Speech Info: Adequate    SPECIAL CARE FACTORS FREQUENCY  OT (By licensed OT), PT (By licensed PT)     PT Frequency: 5x/week OT Frequency: 5x/week            Contractures Contractures Info: Not present    Additional Factors Info  Code Status, Allergies Code Status Info: Full code Allergies Info: no known allergies           Current Medications (09/03/2024):  This is the current hospital active medication list Current Facility-Administered Medications  Medication Dose Route Frequency Provider Last Rate Last Admin   acetaminophen  (TYLENOL ) tablet 650 mg  650 mg Oral Q6H PRN Smith, Rondell A, MD       Or   acetaminophen  (TYLENOL ) suppository 650 mg  650 mg Rectal Q6H PRN Claudene, Rondell A, MD       albuterol (PROVENTIL) (2.5 MG/3ML) 0.083% nebulizer solution 2.5 mg  2.5 mg Nebulization Q6H PRN Smith, Rondell A, MD       cefTRIAXone  (ROCEPHIN ) 2 g in sodium chloride  0.9 % 100 mL IVPB  2 g Intravenous Q24H Smith, Rondell A, MD   Stopped at 09/03/24 0141   enoxaparin (LOVENOX) injection 40 mg  40 mg Subcutaneous Q24H Smith, Rondell A, MD   40 mg at 09/02/24 1215   HYDROcodone -acetaminophen  (NORCO/VICODIN) 5-325 MG per tablet 1 tablet  1 tablet Oral Q6H PRN Claudene Maximino LABOR, MD  ondansetron  (ZOFRAN ) injection 4 mg  4 mg Intravenous Q6H PRN Smith, Rondell A, MD       simvastatin  (ZOCOR ) tablet 40 mg  40 mg Oral Daily Smith, Rondell A, MD   40 mg at 09/03/24 0000   sodium chloride  flush (NS) 0.9 % injection 3 mL  3 mL Intravenous Q12H Smith, Rondell A, MD   3 mL at 09/03/24 0016   Current Outpatient Medications  Medication Sig Dispense Refill   acetaminophen  (TYLENOL ) 500 MG tablet Take 1,000 mg by mouth every 6 (six) hours as needed for headache (pain).     carvedilol  (COREG ) 25 MG tablet Take 1 tablet (25 mg total) by mouth 2 (two) times daily  with a meal. 180 tablet 0   dapagliflozin  propanediol (FARXIGA ) 10 MG TABS tablet Take 1 tablet (10 mg total) by mouth daily before breakfast. (Patient taking differently: Take 10 mg by mouth daily.) 30 tablet 11   isosorbide -hydrALAZINE  (BIDIL ) 20-37.5 MG tablet TAKE 1 TABLET BY MOUTH THREE TIMES A DAY (Patient taking differently: Take 1-2 tablets by mouth See admin instructions. Alternates every other day: 2 tablets in the morning + 1 tablet at night and 1 tablet in the morning + 2 tablets at night.) 270 tablet 4   Multiple Vitamins-Minerals (CENTRUM SILVER MEN 50+) TABS Take 1 tablet by mouth daily.     sacubitril -valsartan  (ENTRESTO ) 97-103 MG TAKE 1 TABLET BY MOUTH TWICE A DAY 180 tablet 3   simvastatin  (ZOCOR ) 40 MG tablet Take 40 mg by mouth daily.      spironolactone  (ALDACTONE ) 25 MG tablet TAKE 1 TABLET (25 MG TOTAL) BY MOUTH DAILY. 90 tablet 1     Discharge Medications: Please see discharge summary for a list of discharge medications.  Relevant Imaging Results:  Relevant Lab Results:   Additional Information SSN [id number] [place], [place]

## 2024-09-03 NOTE — Plan of Care (Signed)
  Problem: Education: Goal: Knowledge of General Education information will improve Description: Including pain rating scale, medication(s)/side effects and non-pharmacologic comfort measures Outcome: Progressing   Problem: Clinical Measurements: Goal: Will remain free from infection Outcome: Progressing Goal: Respiratory complications will improve Outcome: Progressing Goal: Cardiovascular complication will be avoided Outcome: Progressing   Problem: Nutrition: Goal: Adequate nutrition will be maintained Outcome: Progressing   Problem: Elimination: Goal: Will not experience complications related to bowel motility Outcome: Progressing Goal: Will not experience complications related to urinary retention Outcome: Progressing   Problem: Safety: Goal: Ability to remain free from injury will improve Outcome: Progressing   Problem: Skin Integrity: Goal: Risk for impaired skin integrity will decrease Outcome: Progressing   Problem: Health Behavior/Discharge Planning: Goal: Ability to manage health-related needs will improve Outcome: Not Progressing   Problem: Clinical Measurements: Goal: Ability to maintain clinical measurements within normal limits will improve Outcome: Not Progressing   Problem: Activity: Goal: Risk for activity intolerance will decrease Outcome: Not Progressing   Problem: Pain Managment: Goal: General experience of comfort will improve and/or be controlled Outcome: Not Progressing

## 2024-09-03 NOTE — TOC Initial Note (Signed)
 Transition of Care [facility]) - Initial/Assessment Note    Patient Details  Name: Kevin Murillo MRN: [id number] Date of Birth: 12-Oct-1946  [facility] CM/SW Contact:    Luann SHAUNNA Cumming, LCSW Phone Number: 09/03/2024, 11:00 AM  Clinical Narrative:                  CSW met with pt and pt's son bedside. Discussed PT rec for SNF. Pt is somewhat familiar with the process as his wife had been to [facility] in the past. Pt is agreeable to SNF workup. CSW explained workup and insurance auth processes. Fl2 completed and bed requests sent in hub.   Expected Discharge Plan: Skilled Nursing Facility Barriers to Discharge: Continued Medical Work up        Expected Discharge Plan and Services       Living arrangements for the past 2 months: Single Family Home                                      Prior Living Arrangements/Services Living arrangements for the past 2 months: Single Family Home   Patient language and need for interpreter reviewed:: Yes        Need for Family Participation in Patient Care: Yes (Comment) Care giver support system in place?: Yes (comment)   Criminal Activity/Legal Involvement Pertinent to Current Situation/Hospitalization: No - Comment as needed  Activities of Daily Living      Permission Sought/Granted                  Emotional Assessment Appearance:: Appears stated age Attitude/Demeanor/Rapport: Engaged Affect (typically observed): Pleasant, Accepting Orientation: : Oriented to Self, Oriented to Place, Oriented to  Time, Oriented to Situation Alcohol  / Substance Use: Not Applicable Psych Involvement: No (comment)  Admission diagnosis:  Near syncope [R55] Patient Active Problem List   Diagnosis Date Noted   Near syncope 09/03/2024   Syncope 09/02/2024   Left knee pain 09/02/2024   Heart failure with reduced ejection fraction (HCC) 09/02/2024   Urinary tract infection 09/02/2024   Normocytic anemia 09/02/2024    Chronic kidney disease, stage III (moderate) (HCC) 09/02/2024   Transient hypotension 09/02/2024   Osteoarthritis of left hip 02/09/2023   Syncope and collapse 11/26/2020   Osteoarthritis of right hip 11/27/2019   Primary osteoarthritis of right hip 11/27/2019   Dilated cardiomyopathy (HCC) 05/07/2013   History of colonic polyps 11/04/2012   HYPERLIPIDEMIA-MIXED 06/16/2010   Essential hypertension 05/27/2010   LEFT HEART FAILURE 05/27/2010   PCP:  Onita Rush, MD Pharmacy:   [id number] - [place], [place] - [address] [place] [address] Phone: [phone] Fax: [phone]  [facility] [address] [place] [address] Phone: [phone] Fax: [phone]     Social Drivers of Health (SDOH) Social History: SDOH Screenings   Food Insecurity: No Food Insecurity (02/09/2023)  Housing: Low Risk  (02/09/2023)  Transportation Needs: No Transportation Needs (02/09/2023)  Utilities: Not At Risk (02/09/2023)  Depression (PHQ2-9): Low Risk  (09/21/2019)  Tobacco Use: Medium Risk (09/02/2024)   SDOH Interventions:     Readmission Risk Interventions     No data to display
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
## 2024-09-04 DIAGNOSIS — R55 Syncope and collapse: Secondary | ICD-10-CM | POA: Diagnosis not present
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
## 2024-09-04 NOTE — TOC Progression Note (Addendum)
 Transition of Care [facility]) - Progression Note    Patient Details  Name: Kevin Murillo MRN: [id number] Date of Birth: August 02, 1946  [facility] CM/SW Contact  Isaiah Public, LCSWA Phone Number: 09/04/2024, 10:42 AM  Clinical Narrative:     CSW spoke with patient and patients son Trustin at bedside and provided medicare compare ratings list with accepted SNF bed offers. Patient and patients son reviewed. Patient requested for CSW to fax out to additional facility's. CSW faxed out to additional facility's. Patient gave CSW permission to follow up with his son Tally with SNF choice this afternoon. CSW will continue to follow.  Update- CSW called and LVM for patients son Bracken. CSW delivered medicare compare ratings list of additional SNF bed offers to patients room. CSW awaiting call back to provide additional SNF bed offers to patients son Luis.  Update- CSW LVM for [place] with [employer]. CSW awaiting call back to start insurance authorization for SNF and PTAR.  Update- CSW spoke with Jori with [employer] and requested to start insurance authorization for SNF and PTAR. Insurance authorization pending for SNF and PTAR.   Expected Discharge Plan: Skilled Nursing Facility Barriers to Discharge: Continued Medical Work up               Expected Discharge Plan and Services       Living arrangements for the past 2 months: Single Family Home                                       Social Drivers of Health (SDOH) Interventions SDOH Screenings   Food Insecurity: No Food Insecurity (09/03/2024)  Housing: Low Risk  (09/03/2024)  Transportation Needs: No Transportation Needs (09/03/2024)  Utilities: Not At Risk (09/03/2024)  Depression (PHQ2-9): Low Risk  (09/21/2019)  Social Connections: Moderately Isolated (09/03/2024)  Tobacco Use: Medium Risk (09/02/2024)    Readmission Risk Interventions     No data to display
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
## 2024-09-05 DIAGNOSIS — R55 Syncope and collapse: Secondary | ICD-10-CM | POA: Diagnosis not present
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
## 2024-09-05 NOTE — TOC Progression Note (Signed)
 Transition of Care [facility]) - Progression Note    Patient Details  Name: Kevin Murillo MRN: [id number] Date of Birth: 03-23-1946  [facility] CM/SW Contact  Isaiah Public, LCSWA Phone Number: 09/05/2024, 1:22 PM  Clinical Narrative:     CSW spoke with patient and patients son Azion at bedside. Patient request to see if [facility] can offer SNF bed . CSW spoke with [place] who confirmed she will review referral. CSW heard back from facility and [facility] unable to offer SNF bed. CSW informed patients son Braydin. Derrel informed CSW that he is going to review SNF bed offers and give CSW a call back with SNF choice. Tammy with [facility] called CSW and informed CSW that patients insurance authorization for SNF and PTAR has been approved. SNR approval # [id number].PTAR approval # [id number].  Update- CSW spoke with [place] patients son who informed CSW that patient accepted SNF bed offer with [facility]. CSW spoke with Nyle in admissions with [facility] who confirmed SNF bed for patient. Nyle confirmed they can accept patient tomorrow if medically ready.   Update- Jon with [facility] called CSW and informed CSW that [facility] unable to offer SNF bed for patient. Kennedy with [facility] did not realize he had [facility]. Facility does not accept patients insurance. CSW informed patients son. Patients son informed CSW that patient accept SNF bed with [facility]. CSW spoke with Kia with [facility] who confirmed SNF bed for patient. Kia confirmed facility will have a SNF bed for patient tomorrow if medically ready.  Expected Discharge Plan: Skilled Nursing Facility Barriers to Discharge: Continued Medical Work up               Expected Discharge Plan and Services       Living arrangements for the past 2 months: Single Family Home                                       Social Drivers of Health (SDOH) Interventions SDOH Screenings   Food Insecurity: No Food Insecurity (09/03/2024)   Housing: Low Risk  (09/03/2024)  Transportation Needs: No Transportation Needs (09/03/2024)  Utilities: Not At Risk (09/03/2024)  Depression (PHQ2-9): Low Risk  (09/21/2019)  Social Connections: Moderately Isolated (09/03/2024)  Tobacco Use: Medium Risk (09/02/2024)    Readmission Risk Interventions     No data to display

## 2024-09-06 ENCOUNTER — Inpatient Hospital Stay (HOSPITAL_COMMUNITY)

## 2024-09-06 DIAGNOSIS — M25561 Pain in right knee: Secondary | ICD-10-CM | POA: Diagnosis not present

## 2024-09-06 DIAGNOSIS — R55 Syncope and collapse: Secondary | ICD-10-CM | POA: Diagnosis not present

## 2024-09-06 DIAGNOSIS — M25461 Effusion, right knee: Secondary | ICD-10-CM | POA: Diagnosis not present

## 2024-09-06 LAB — SYNOVIAL CELL COUNT + DIFF, W/ CRYSTALS
Eosinophils-Synovial: 0 % (ref 0–1)
Lymphocytes-Synovial Fld: 0 % (ref 0–20)
Monocyte-Macrophage-Synovial Fluid: 4 % — ABNORMAL LOW (ref 50–90)
Neutrophil, Synovial: 96 % — ABNORMAL HIGH (ref 0–25)
WBC, Synovial: 11250 /mm3 — ABNORMAL HIGH (ref 0–200)

## 2024-09-06 LAB — URIC ACID: Uric Acid, Serum: 5.4 mg/dL (ref 3.7–8.6)

## 2024-09-06 MED ORDER — BUPIVACAINE HCL (PF) 0.5 % IJ SOLN
10.0000 mL | Freq: Once | INTRAMUSCULAR | Status: DC
  Filled 2024-09-06: qty 10

## 2024-09-06 MED ORDER — METHYLPREDNISOLONE ACETATE 40 MG/ML IJ SUSP
40.0000 mg | Freq: Once | INTRAMUSCULAR | Status: DC
Start: 2024-09-06 — End: 2024-09-07
  Filled 2024-09-06: qty 1

## 2024-09-06 NOTE — Progress Notes (Signed)
 Patient ID: Kevin Murillo, male   DOB: [birth year], 78 y.o.   MRN: [id number]   LOS: 3 days   Subjective: C/o right knee pain and swelling for last 2-3d. No hx/o gout but did have some severe toe pain at one point that resolved on its own in a few days.   Objective: Vital signs in last 24 hours: Temp:  [98.6 F (37 C)-99.6 F (37.6 C)] 98.7 F (37.1 C) (10/10 1236) Pulse Rate:  [87-98] 97 (10/10 1236) Resp:  [12-22] 18 (10/10 1236) BP: (91-113)/(50-70) 111/67 (10/10 1236) SpO2:  [94 %-100 %] 100 % (10/10 1236) Weight:  [111.9 kg] 111.9 kg (10/10 0528) Last BM Date : 09/05/24    Physical Exam General appearance: alert and no distress Right knee -- Large effusion, mod pain with AROM/PROM limited to ~30 degrees   Assessment/Plan: Right knee effusion -- Will tap before he goes to rehab    Kevin DOROTHA Ned, PA-C Orthopedic Surgery [phone] 09/06/2024

## 2024-09-06 NOTE — TOC Progression Note (Addendum)
 Transition of Care [facility]) - Progression Note    Patient Details  Name: Kevin Murillo MRN: [id number] Date of Birth: Mar 30, 1946  Transition [facility]) CM/SW Contact  Isaiah Public, LCSWA Phone Number: 09/06/2024, 12:27 PM  Clinical Narrative:     CSW LVM for Tammy with [facility]. CSW awaiting call back to provide patients facility choice for insurance authorization for SNF. Kia with [facility] admissions informed CSW that facility can accept patient tomorrow if medically ready for dc in a private room. CSW informed MD. CSW will update patient and patients son Lerry. CSW will continue to follow.  Update- CSW received call back from Tammy with [facility]. CSW provided patients facility choice for patients insurance authorization for SNF.  Expected Discharge Plan: Skilled Nursing Facility Barriers to Discharge: Continued Medical Work up               Expected Discharge Plan and Services       Living arrangements for the past 2 months: Single Family Home                                       Social Drivers of Health (SDOH) Interventions SDOH Screenings   Food Insecurity: No Food Insecurity (09/03/2024)  Housing: Low Risk  (09/03/2024)  Transportation Needs: No Transportation Needs (09/03/2024)  Utilities: Not At Risk (09/03/2024)  Depression (PHQ2-9): Low Risk  (09/21/2019)  Social Connections: Moderately Isolated (09/03/2024)  Tobacco Use: Medium Risk (09/02/2024)    Readmission Risk Interventions     No data to display
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
## 2024-09-06 NOTE — Plan of Care (Signed)

## 2024-09-06 NOTE — Procedures (Signed)
 Procedure: Right knee aspiration and injection   Indication: Right knee effusion(s)   Surgeon: Ozell Ned, PA-C   Assist: None   Anesthesia: Topical refrigerant   EBL: None   Complications: None   Findings: After risks/benefits explained patient desires to undergo procedure. Consent obtained and time out performed. The right knee was sterilely prepped and aspirated. 83ml mildly turbid yellow fluid obtained. 6ml 0.5% Marcaine  and 40mg  depomedrol instilled. Pt tolerated the procedure well.       [name], PA-C Orthopedic Surgery [phone]

## 2024-09-07 DIAGNOSIS — D649 Anemia, unspecified: Secondary | ICD-10-CM | POA: Diagnosis not present

## 2024-09-07 DIAGNOSIS — K5900 Constipation, unspecified: Secondary | ICD-10-CM | POA: Diagnosis not present

## 2024-09-07 DIAGNOSIS — I959 Hypotension, unspecified: Secondary | ICD-10-CM | POA: Diagnosis not present

## 2024-09-07 DIAGNOSIS — S72432D Displaced fracture of medial condyle of left femur, subsequent encounter for closed fracture with routine healing: Secondary | ICD-10-CM | POA: Diagnosis not present

## 2024-09-07 DIAGNOSIS — I420 Dilated cardiomyopathy: Secondary | ICD-10-CM | POA: Diagnosis not present

## 2024-09-07 DIAGNOSIS — M25462 Effusion, left knee: Secondary | ICD-10-CM | POA: Diagnosis not present

## 2024-09-07 DIAGNOSIS — Z1612 Extended spectrum beta lactamase (ESBL) resistance: Secondary | ICD-10-CM | POA: Diagnosis not present

## 2024-09-07 DIAGNOSIS — A499 Bacterial infection, unspecified: Secondary | ICD-10-CM | POA: Diagnosis not present

## 2024-09-07 DIAGNOSIS — R635 Abnormal weight gain: Secondary | ICD-10-CM | POA: Diagnosis not present

## 2024-09-07 DIAGNOSIS — G629 Polyneuropathy, unspecified: Secondary | ICD-10-CM | POA: Diagnosis not present

## 2024-09-07 DIAGNOSIS — R531 Weakness: Secondary | ICD-10-CM | POA: Diagnosis not present

## 2024-09-07 DIAGNOSIS — E8809 Other disorders of plasma-protein metabolism, not elsewhere classified: Secondary | ICD-10-CM | POA: Diagnosis not present

## 2024-09-07 DIAGNOSIS — Z6835 Body mass index (BMI) 35.0-35.9, adult: Secondary | ICD-10-CM | POA: Diagnosis not present

## 2024-09-07 DIAGNOSIS — N1832 Chronic kidney disease, stage 3b: Secondary | ICD-10-CM | POA: Diagnosis not present

## 2024-09-07 DIAGNOSIS — I10 Essential (primary) hypertension: Secondary | ICD-10-CM | POA: Diagnosis not present

## 2024-09-07 DIAGNOSIS — I5022 Chronic systolic (congestive) heart failure: Secondary | ICD-10-CM | POA: Diagnosis not present

## 2024-09-07 DIAGNOSIS — Z87898 Personal history of other specified conditions: Secondary | ICD-10-CM | POA: Diagnosis not present

## 2024-09-07 DIAGNOSIS — Z4789 Encounter for other orthopedic aftercare: Secondary | ICD-10-CM | POA: Diagnosis not present

## 2024-09-07 DIAGNOSIS — I493 Ventricular premature depolarization: Secondary | ICD-10-CM | POA: Diagnosis not present

## 2024-09-07 DIAGNOSIS — M25562 Pain in left knee: Secondary | ICD-10-CM | POA: Diagnosis not present

## 2024-09-07 DIAGNOSIS — R55 Syncope and collapse: Secondary | ICD-10-CM | POA: Diagnosis not present

## 2024-09-07 DIAGNOSIS — M160 Bilateral primary osteoarthritis of hip: Secondary | ICD-10-CM | POA: Diagnosis not present

## 2024-09-07 DIAGNOSIS — S72421A Displaced fracture of lateral condyle of right femur, initial encounter for closed fracture: Secondary | ICD-10-CM | POA: Diagnosis not present

## 2024-09-07 DIAGNOSIS — M25572 Pain in left ankle and joints of left foot: Secondary | ICD-10-CM | POA: Diagnosis not present

## 2024-09-07 DIAGNOSIS — M25561 Pain in right knee: Secondary | ICD-10-CM | POA: Diagnosis not present

## 2024-09-07 DIAGNOSIS — N390 Urinary tract infection, site not specified: Secondary | ICD-10-CM | POA: Diagnosis not present

## 2024-09-07 DIAGNOSIS — H2513 Age-related nuclear cataract, bilateral: Secondary | ICD-10-CM | POA: Diagnosis not present

## 2024-09-07 DIAGNOSIS — M6281 Muscle weakness (generalized): Secondary | ICD-10-CM | POA: Diagnosis not present

## 2024-09-07 DIAGNOSIS — M1712 Unilateral primary osteoarthritis, left knee: Secondary | ICD-10-CM | POA: Diagnosis not present

## 2024-09-07 DIAGNOSIS — E785 Hyperlipidemia, unspecified: Secondary | ICD-10-CM | POA: Diagnosis not present

## 2024-09-07 DIAGNOSIS — Z7409 Other reduced mobility: Secondary | ICD-10-CM | POA: Diagnosis not present

## 2024-09-07 DIAGNOSIS — I130 Hypertensive heart and chronic kidney disease with heart failure and stage 1 through stage 4 chronic kidney disease, or unspecified chronic kidney disease: Secondary | ICD-10-CM | POA: Diagnosis not present

## 2024-09-07 DIAGNOSIS — R8290 Unspecified abnormal findings in urine: Secondary | ICD-10-CM | POA: Diagnosis not present

## 2024-09-07 DIAGNOSIS — M109 Gout, unspecified: Secondary | ICD-10-CM | POA: Diagnosis not present

## 2024-09-07 DIAGNOSIS — Z87891 Personal history of nicotine dependence: Secondary | ICD-10-CM | POA: Diagnosis not present

## 2024-09-07 DIAGNOSIS — Z7401 Bed confinement status: Secondary | ICD-10-CM | POA: Diagnosis not present

## 2024-09-07 MED ORDER — COLCHICINE 0.6 MG PO TABS: 0.6000 mg | ORAL_TABLET | Freq: Every day | ORAL | 0 refills | Status: AC

## 2024-09-07 MED ORDER — HYDROCODONE-ACETAMINOPHEN 5-325 MG PO TABS: 1.0000 | ORAL_TABLET | Freq: Four times a day (QID) | ORAL | 0 refills | Status: AC | PRN

## 2024-09-07 MED ORDER — COLCHICINE 0.6 MG PO TABS
1.2000 mg | ORAL_TABLET | Freq: Once | ORAL | Status: AC
  Administered 2024-09-07: 1.2 mg via ORAL
  Filled 2024-09-07: qty 2

## 2024-09-07 MED ORDER — ALLOPURINOL 100 MG PO TABS
100.0000 mg | ORAL_TABLET | Freq: Every day | ORAL | Status: DC
  Administered 2024-09-07: 100 mg via ORAL
  Filled 2024-09-07: qty 1

## 2024-09-07 MED ORDER — ALLOPURINOL 100 MG PO TABS: 100.0000 mg | ORAL_TABLET | Freq: Every day | ORAL | Status: AC

## 2024-09-07 MED ORDER — COLCHICINE 0.6 MG PO TABS: 0.6000 mg | ORAL_TABLET | Freq: Every day | ORAL | Status: DC

## 2024-09-07 MED ORDER — COLCHICINE 0.6 MG PO TABS
0.6000 mg | ORAL_TABLET | Freq: Once | ORAL | Status: AC
  Administered 2024-09-07: 0.6 mg via ORAL
  Filled 2024-09-07: qty 1

## 2024-09-07 NOTE — Progress Notes (Signed)
 DISCHARGE NOTE SNF [facility] to be discharged Skilled nursing facility [facility] per MD order. Patient verbalized understanding.  Skin clean, dry and intact without evidence of skin break down, no evidence of skin tears noted. IV catheter discontinued intact. Site without signs and symptoms of complications. Dressing and pressure applied. Pt denies pain at the site currently. No complaints noted.  Patient free of lines, drains, and wounds.   Discharge packet assembled. An After Visit Summary (AVS) was printed and given to the EMS personnel. Patient escorted via stretcher and discharged to [facility] via ambulance. Report called to accepting facility; all questions and concerns addressed.   Peyton SHAUNNA Pepper, RN

## 2024-09-07 NOTE — Plan of Care (Signed)
  Problem: Education: Goal: Knowledge of General Education information will improve Description: Including pain rating scale, medication(s)/side effects and non-pharmacologic comfort measures 09/07/2024 0922 by Elgin Lonni BRAVO, RN Outcome: Adequate for Discharge 09/06/2024 1933 by Elgin Lonni BRAVO, RN Outcome: Progressing   Problem: Health Behavior/Discharge Planning: Goal: Ability to manage health-related needs will improve 09/07/2024 0922 by Elgin Lonni BRAVO, RN Outcome: Adequate for Discharge 09/06/2024 1933 by Elgin Lonni BRAVO, RN Outcome: Progressing   Problem: Clinical Measurements: Goal: Ability to maintain clinical measurements within normal limits will improve 09/07/2024 0922 by Elgin Lonni BRAVO, RN Outcome: Adequate for Discharge 09/06/2024 1933 by Elgin Lonni BRAVO, RN Outcome: Progressing Goal: Will remain free from infection 09/07/2024 0922 by Elgin Lonni BRAVO, RN Outcome: Adequate for Discharge 09/06/2024 1933 by Elgin Lonni BRAVO, RN Outcome: Progressing Goal: Diagnostic test results will improve 09/07/2024 0922 by Elgin Lonni BRAVO, RN Outcome: Adequate for Discharge 09/06/2024 1933 by Elgin Lonni BRAVO, RN Outcome: Progressing Goal: Respiratory complications will improve 09/07/2024 0922 by Elgin Lonni BRAVO, RN Outcome: Adequate for Discharge 09/06/2024 1933 by Elgin Lonni BRAVO, RN Outcome: Progressing Goal: Cardiovascular complication will be avoided 09/07/2024 9077 by Elgin Lonni BRAVO, RN Outcome: Adequate for Discharge 09/06/2024 1933 by Elgin Lonni BRAVO, RN Outcome: Progressing   Problem: Activity: Goal: Risk for activity intolerance will decrease 09/07/2024 0922 by Elgin Lonni BRAVO, RN Outcome: Adequate for Discharge 09/06/2024 1933 by Elgin Lonni BRAVO, RN Outcome: Progressing   Problem: Nutrition: Goal: Adequate nutrition will be maintained 09/07/2024 0922 by Elgin Lonni BRAVO,  RN Outcome: Adequate for Discharge 09/06/2024 1933 by Elgin Lonni BRAVO, RN Outcome: Progressing   Problem: Coping: Goal: Level of anxiety will decrease 09/07/2024 0922 by Elgin Lonni BRAVO, RN Outcome: Adequate for Discharge 09/06/2024 1933 by Elgin Lonni BRAVO, RN Outcome: Progressing   Problem: Elimination: Goal: Will not experience complications related to bowel motility 09/07/2024 0922 by Elgin Lonni BRAVO, RN Outcome: Adequate for Discharge 09/06/2024 1933 by Elgin Lonni BRAVO, RN Outcome: Progressing Goal: Will not experience complications related to urinary retention 09/07/2024 0922 by Elgin Lonni BRAVO, RN Outcome: Adequate for Discharge 09/06/2024 1933 by Elgin Lonni BRAVO, RN Outcome: Progressing   Problem: Pain Managment: Goal: General experience of comfort will improve and/or be controlled 09/07/2024 0922 by Elgin Lonni BRAVO, RN Outcome: Adequate for Discharge 09/06/2024 1933 by Elgin Lonni BRAVO, RN Outcome: Progressing   Problem: Safety: Goal: Ability to remain free from injury will improve 09/07/2024 0922 by Elgin Lonni BRAVO, RN Outcome: Adequate for Discharge 09/06/2024 1933 by Elgin Lonni BRAVO, RN Outcome: Progressing   Problem: Skin Integrity: Goal: Risk for impaired skin integrity will decrease 09/07/2024 0922 by Elgin Lonni BRAVO, RN Outcome: Adequate for Discharge 09/06/2024 1933 by Elgin Lonni BRAVO, RN Outcome: Progressing

## 2024-09-07 NOTE — TOC Transition Note (Signed)
 Transition of Care [facility]) - Discharge Note   Patient Details  Name: Kevin Murillo MRN: [id number] Date of Birth: 05/01/1946  [facility] CM/SW Contact:  Isaiah Public, LCSWA Phone Number: 09/07/2024, 1:33 PM   Clinical Narrative:     Patient will DC to: [facility]  Anticipated DC date: 09/07/2024  Family notified: Sudie  Transport by: [facility]  ?  Per MD patient ready for DC to [facility] . RN, patient, patient's family, and facility notified of DC. Discharge Summary sent to facility. RN given number for report [phone] RM# [phone]. DC packet on chart. Ambulance transport requested for patient.  CSW signing off.   Final next level of care: Skilled Nursing Facility Barriers to Discharge: No Barriers Identified   Patient Goals and CMS Choice Patient states their goals for this hospitalization and ongoing recovery are:: SNF CMS Medicare.gov Compare Post Acute Care list provided to:: Patient Choice offered to / list presented to : Patient, Adult Children (patient and son)      Discharge Placement              Patient chooses bed at: [facility] Patient to be transferred to facility by: [facility] Name of family member notified: Sudie Patient and family notified of of transfer: 09/07/24  Discharge Plan and Services Additional resources added to the After Visit Summary for                                       Social Drivers of Health (SDOH) Interventions SDOH Screenings   Food Insecurity: No Food Insecurity (09/03/2024)  Housing: Low Risk  (09/03/2024)  Transportation Needs: No Transportation Needs (09/03/2024)  Utilities: Not At Risk (09/03/2024)  Depression (PHQ2-9): Low Risk  (09/21/2019)  Social Connections: Moderately Isolated (09/03/2024)  Tobacco Use: Medium Risk (09/02/2024)     Readmission Risk Interventions     No data to display

## 2024-09-07 NOTE — Plan of Care (Signed)
  Problem: Education: Goal: Knowledge of General Education information will improve Description: Including pain rating scale, medication(s)/side effects and non-pharmacologic comfort measures Outcome: Progressing   Problem: Clinical Measurements: Goal: Ability to maintain clinical measurements within normal limits will improve Outcome: Progressing Goal: Will remain free from infection Outcome: Progressing Goal: Diagnostic test results will improve Outcome: Progressing   Problem: Activity: Goal: Risk for activity intolerance will decrease Outcome: Progressing   
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
## 2024-09-07 NOTE — Progress Notes (Signed)
 Telephonic report given to Tammy, Charity fundraiser at [facility].  Advised we do not currently have a pick-up time for [facility] transport to facility.  Will continue to monitor patient pending transport.

## 2024-09-07 NOTE — TOC Progression Note (Signed)
 Transition of Care [facility]) - Progression Note    Patient Details  Name: Kevin Murillo MRN: [id number] Date of Birth: November 05, 1946  Transition of [facility]) CM/SW Contact  Isaiah Public, LCSWA Phone Number: 09/07/2024, 1:25 PM  Clinical Narrative:     Kia with [facility] confirmed that patient can dc over today. CSW informed MD.  Expected Discharge Plan: Skilled Nursing Facility Barriers to Discharge: Continued Medical Work up               Expected Discharge Plan and Services       Living arrangements for the past 2 months: Single Family Home Expected Discharge Date: 09/07/24                                     Social Drivers of Health (SDOH) Interventions SDOH Screenings   Food Insecurity: No Food Insecurity (09/03/2024)  Housing: Low Risk  (09/03/2024)  Transportation Needs: No Transportation Needs (09/03/2024)  Utilities: Not At Risk (09/03/2024)  Depression (PHQ2-9): Low Risk  (09/21/2019)  Social Connections: Moderately Isolated (09/03/2024)  Tobacco Use: Medium Risk (09/02/2024)    Readmission Risk Interventions     No data to display

## 2024-09-08 ENCOUNTER — Other Ambulatory Visit (HOSPITAL_COMMUNITY): Payer: Self-pay | Admitting: Cardiology

## 2024-09-08 DIAGNOSIS — D649 Anemia, unspecified: Secondary | ICD-10-CM | POA: Diagnosis not present

## 2024-09-08 DIAGNOSIS — M6281 Muscle weakness (generalized): Secondary | ICD-10-CM | POA: Diagnosis not present

## 2024-09-08 DIAGNOSIS — S72432D Displaced fracture of medial condyle of left femur, subsequent encounter for closed fracture with routine healing: Secondary | ICD-10-CM | POA: Diagnosis not present

## 2024-09-08 DIAGNOSIS — H2513 Age-related nuclear cataract, bilateral: Secondary | ICD-10-CM | POA: Diagnosis not present

## 2024-09-08 DIAGNOSIS — I130 Hypertensive heart and chronic kidney disease with heart failure and stage 1 through stage 4 chronic kidney disease, or unspecified chronic kidney disease: Secondary | ICD-10-CM | POA: Diagnosis not present

## 2024-09-08 DIAGNOSIS — G629 Polyneuropathy, unspecified: Secondary | ICD-10-CM | POA: Diagnosis not present

## 2024-09-08 DIAGNOSIS — I5022 Chronic systolic (congestive) heart failure: Secondary | ICD-10-CM | POA: Diagnosis not present

## 2024-09-08 DIAGNOSIS — M109 Gout, unspecified: Secondary | ICD-10-CM | POA: Diagnosis not present

## 2024-09-08 DIAGNOSIS — N1832 Chronic kidney disease, stage 3b: Secondary | ICD-10-CM | POA: Diagnosis not present

## 2024-09-08 DIAGNOSIS — E785 Hyperlipidemia, unspecified: Secondary | ICD-10-CM | POA: Diagnosis not present

## 2024-09-08 DIAGNOSIS — M25562 Pain in left knee: Secondary | ICD-10-CM | POA: Diagnosis not present

## 2024-09-08 DIAGNOSIS — I959 Hypotension, unspecified: Secondary | ICD-10-CM | POA: Diagnosis not present

## 2024-09-09 DIAGNOSIS — S72432D Displaced fracture of medial condyle of left femur, subsequent encounter for closed fracture with routine healing: Secondary | ICD-10-CM | POA: Diagnosis not present

## 2024-09-09 DIAGNOSIS — M6281 Muscle weakness (generalized): Secondary | ICD-10-CM | POA: Diagnosis not present

## 2024-09-09 DIAGNOSIS — M25562 Pain in left knee: Secondary | ICD-10-CM | POA: Diagnosis not present

## 2024-09-09 DIAGNOSIS — Z7409 Other reduced mobility: Secondary | ICD-10-CM | POA: Diagnosis not present

## 2024-09-09 DIAGNOSIS — M109 Gout, unspecified: Secondary | ICD-10-CM | POA: Diagnosis not present

## 2024-09-09 DIAGNOSIS — Z87898 Personal history of other specified conditions: Secondary | ICD-10-CM | POA: Diagnosis not present

## 2024-09-09 LAB — PATHOLOGIST SMEAR REVIEW: Path Review: NEGATIVE

## 2024-09-10 DIAGNOSIS — M25561 Pain in right knee: Secondary | ICD-10-CM | POA: Diagnosis not present

## 2024-09-10 DIAGNOSIS — G629 Polyneuropathy, unspecified: Secondary | ICD-10-CM | POA: Diagnosis not present

## 2024-09-10 DIAGNOSIS — I10 Essential (primary) hypertension: Secondary | ICD-10-CM | POA: Diagnosis not present

## 2024-09-10 DIAGNOSIS — E785 Hyperlipidemia, unspecified: Secondary | ICD-10-CM | POA: Diagnosis not present

## 2024-09-10 DIAGNOSIS — I420 Dilated cardiomyopathy: Secondary | ICD-10-CM | POA: Diagnosis not present

## 2024-09-10 DIAGNOSIS — Z7409 Other reduced mobility: Secondary | ICD-10-CM | POA: Diagnosis not present

## 2024-09-10 DIAGNOSIS — I5022 Chronic systolic (congestive) heart failure: Secondary | ICD-10-CM | POA: Diagnosis not present

## 2024-09-10 DIAGNOSIS — I130 Hypertensive heart and chronic kidney disease with heart failure and stage 1 through stage 4 chronic kidney disease, or unspecified chronic kidney disease: Secondary | ICD-10-CM | POA: Diagnosis not present

## 2024-09-10 DIAGNOSIS — M6281 Muscle weakness (generalized): Secondary | ICD-10-CM | POA: Diagnosis not present

## 2024-09-10 DIAGNOSIS — M25562 Pain in left knee: Secondary | ICD-10-CM | POA: Diagnosis not present

## 2024-09-10 LAB — BODY FLUID CULTURE W GRAM STAIN: Culture: NO GROWTH

## 2024-09-11 DIAGNOSIS — I130 Hypertensive heart and chronic kidney disease with heart failure and stage 1 through stage 4 chronic kidney disease, or unspecified chronic kidney disease: Secondary | ICD-10-CM | POA: Diagnosis not present

## 2024-09-11 DIAGNOSIS — Z4789 Encounter for other orthopedic aftercare: Secondary | ICD-10-CM | POA: Diagnosis not present

## 2024-09-11 DIAGNOSIS — M1712 Unilateral primary osteoarthritis, left knee: Secondary | ICD-10-CM | POA: Diagnosis not present

## 2024-09-11 DIAGNOSIS — M6281 Muscle weakness (generalized): Secondary | ICD-10-CM | POA: Diagnosis not present

## 2024-09-11 DIAGNOSIS — I5022 Chronic systolic (congestive) heart failure: Secondary | ICD-10-CM | POA: Diagnosis not present

## 2024-09-11 DIAGNOSIS — Z7409 Other reduced mobility: Secondary | ICD-10-CM | POA: Diagnosis not present

## 2024-09-11 DIAGNOSIS — S72432D Displaced fracture of medial condyle of left femur, subsequent encounter for closed fracture with routine healing: Secondary | ICD-10-CM | POA: Diagnosis not present

## 2024-09-12 DIAGNOSIS — Z7409 Other reduced mobility: Secondary | ICD-10-CM | POA: Diagnosis not present

## 2024-09-12 DIAGNOSIS — Z4789 Encounter for other orthopedic aftercare: Secondary | ICD-10-CM | POA: Diagnosis not present

## 2024-09-12 DIAGNOSIS — M1712 Unilateral primary osteoarthritis, left knee: Secondary | ICD-10-CM | POA: Diagnosis not present

## 2024-09-12 DIAGNOSIS — M6281 Muscle weakness (generalized): Secondary | ICD-10-CM | POA: Diagnosis not present

## 2024-09-12 DIAGNOSIS — S72432D Displaced fracture of medial condyle of left femur, subsequent encounter for closed fracture with routine healing: Secondary | ICD-10-CM | POA: Diagnosis not present

## 2024-09-16 DIAGNOSIS — Z4789 Encounter for other orthopedic aftercare: Secondary | ICD-10-CM | POA: Diagnosis not present

## 2024-09-16 DIAGNOSIS — I130 Hypertensive heart and chronic kidney disease with heart failure and stage 1 through stage 4 chronic kidney disease, or unspecified chronic kidney disease: Secondary | ICD-10-CM | POA: Diagnosis not present

## 2024-09-16 DIAGNOSIS — S72432D Displaced fracture of medial condyle of left femur, subsequent encounter for closed fracture with routine healing: Secondary | ICD-10-CM | POA: Diagnosis not present

## 2024-09-16 DIAGNOSIS — Z7409 Other reduced mobility: Secondary | ICD-10-CM | POA: Diagnosis not present

## 2024-09-16 DIAGNOSIS — M6281 Muscle weakness (generalized): Secondary | ICD-10-CM | POA: Diagnosis not present

## 2024-09-16 DIAGNOSIS — M1712 Unilateral primary osteoarthritis, left knee: Secondary | ICD-10-CM | POA: Diagnosis not present

## 2024-09-18 DIAGNOSIS — I5022 Chronic systolic (congestive) heart failure: Secondary | ICD-10-CM | POA: Diagnosis not present

## 2024-09-18 DIAGNOSIS — M6281 Muscle weakness (generalized): Secondary | ICD-10-CM | POA: Diagnosis not present

## 2024-09-18 DIAGNOSIS — I130 Hypertensive heart and chronic kidney disease with heart failure and stage 1 through stage 4 chronic kidney disease, or unspecified chronic kidney disease: Secondary | ICD-10-CM | POA: Diagnosis not present

## 2024-09-18 DIAGNOSIS — R55 Syncope and collapse: Secondary | ICD-10-CM | POA: Diagnosis not present

## 2024-09-18 DIAGNOSIS — M109 Gout, unspecified: Secondary | ICD-10-CM | POA: Diagnosis not present

## 2024-09-18 DIAGNOSIS — S72432D Displaced fracture of medial condyle of left femur, subsequent encounter for closed fracture with routine healing: Secondary | ICD-10-CM | POA: Diagnosis not present

## 2024-09-19 DIAGNOSIS — S72432D Displaced fracture of medial condyle of left femur, subsequent encounter for closed fracture with routine healing: Secondary | ICD-10-CM | POA: Diagnosis not present

## 2024-09-19 DIAGNOSIS — Z7409 Other reduced mobility: Secondary | ICD-10-CM | POA: Diagnosis not present

## 2024-09-19 DIAGNOSIS — Z4789 Encounter for other orthopedic aftercare: Secondary | ICD-10-CM | POA: Diagnosis not present

## 2024-09-19 DIAGNOSIS — M1712 Unilateral primary osteoarthritis, left knee: Secondary | ICD-10-CM | POA: Diagnosis not present

## 2024-09-19 DIAGNOSIS — M25562 Pain in left knee: Secondary | ICD-10-CM | POA: Diagnosis not present

## 2024-09-19 DIAGNOSIS — M6281 Muscle weakness (generalized): Secondary | ICD-10-CM | POA: Diagnosis not present

## 2024-09-20 DIAGNOSIS — Z7409 Other reduced mobility: Secondary | ICD-10-CM | POA: Diagnosis not present

## 2024-09-20 DIAGNOSIS — M25562 Pain in left knee: Secondary | ICD-10-CM | POA: Diagnosis not present

## 2024-09-20 DIAGNOSIS — S72432D Displaced fracture of medial condyle of left femur, subsequent encounter for closed fracture with routine healing: Secondary | ICD-10-CM | POA: Diagnosis not present

## 2024-09-20 DIAGNOSIS — Z4789 Encounter for other orthopedic aftercare: Secondary | ICD-10-CM | POA: Diagnosis not present

## 2024-09-20 DIAGNOSIS — M6281 Muscle weakness (generalized): Secondary | ICD-10-CM | POA: Diagnosis not present

## 2024-09-23 DIAGNOSIS — M25572 Pain in left ankle and joints of left foot: Secondary | ICD-10-CM | POA: Diagnosis not present

## 2024-09-23 DIAGNOSIS — S72421A Displaced fracture of lateral condyle of right femur, initial encounter for closed fracture: Secondary | ICD-10-CM | POA: Diagnosis not present

## 2024-09-24 DIAGNOSIS — M6281 Muscle weakness (generalized): Secondary | ICD-10-CM | POA: Diagnosis not present

## 2024-09-24 DIAGNOSIS — S72432D Displaced fracture of medial condyle of left femur, subsequent encounter for closed fracture with routine healing: Secondary | ICD-10-CM | POA: Diagnosis not present

## 2024-09-24 DIAGNOSIS — Z4789 Encounter for other orthopedic aftercare: Secondary | ICD-10-CM | POA: Diagnosis not present

## 2024-09-24 DIAGNOSIS — M25462 Effusion, left knee: Secondary | ICD-10-CM | POA: Diagnosis not present

## 2024-09-24 DIAGNOSIS — Z7409 Other reduced mobility: Secondary | ICD-10-CM | POA: Diagnosis not present

## 2024-09-26 DIAGNOSIS — I5022 Chronic systolic (congestive) heart failure: Secondary | ICD-10-CM | POA: Diagnosis not present

## 2024-09-26 DIAGNOSIS — M109 Gout, unspecified: Secondary | ICD-10-CM | POA: Diagnosis not present

## 2024-09-26 DIAGNOSIS — R55 Syncope and collapse: Secondary | ICD-10-CM | POA: Diagnosis not present

## 2024-09-26 DIAGNOSIS — M6281 Muscle weakness (generalized): Secondary | ICD-10-CM | POA: Diagnosis not present

## 2024-10-02 DIAGNOSIS — I5022 Chronic systolic (congestive) heart failure: Secondary | ICD-10-CM | POA: Diagnosis not present

## 2024-10-02 DIAGNOSIS — M109 Gout, unspecified: Secondary | ICD-10-CM | POA: Diagnosis not present

## 2024-10-02 DIAGNOSIS — R55 Syncope and collapse: Secondary | ICD-10-CM | POA: Diagnosis not present

## 2024-10-02 DIAGNOSIS — M6281 Muscle weakness (generalized): Secondary | ICD-10-CM | POA: Diagnosis not present

## 2024-10-03 DIAGNOSIS — M25562 Pain in left knee: Secondary | ICD-10-CM | POA: Diagnosis not present

## 2024-10-03 DIAGNOSIS — Z4789 Encounter for other orthopedic aftercare: Secondary | ICD-10-CM | POA: Diagnosis not present

## 2024-10-03 DIAGNOSIS — R635 Abnormal weight gain: Secondary | ICD-10-CM | POA: Diagnosis not present

## 2024-10-03 DIAGNOSIS — S72432D Displaced fracture of medial condyle of left femur, subsequent encounter for closed fracture with routine healing: Secondary | ICD-10-CM | POA: Diagnosis not present

## 2024-10-03 DIAGNOSIS — M1712 Unilateral primary osteoarthritis, left knee: Secondary | ICD-10-CM | POA: Diagnosis not present

## 2024-10-03 DIAGNOSIS — Z7409 Other reduced mobility: Secondary | ICD-10-CM | POA: Diagnosis not present

## 2024-10-03 DIAGNOSIS — M6281 Muscle weakness (generalized): Secondary | ICD-10-CM | POA: Diagnosis not present

## 2024-10-06 DIAGNOSIS — S72432D Displaced fracture of medial condyle of left femur, subsequent encounter for closed fracture with routine healing: Secondary | ICD-10-CM | POA: Diagnosis not present

## 2024-10-06 DIAGNOSIS — M25562 Pain in left knee: Secondary | ICD-10-CM | POA: Diagnosis not present

## 2024-10-06 DIAGNOSIS — M6281 Muscle weakness (generalized): Secondary | ICD-10-CM | POA: Diagnosis not present

## 2024-10-06 DIAGNOSIS — Z7409 Other reduced mobility: Secondary | ICD-10-CM | POA: Diagnosis not present

## 2024-10-06 DIAGNOSIS — Z4789 Encounter for other orthopedic aftercare: Secondary | ICD-10-CM | POA: Diagnosis not present

## 2024-10-09 DIAGNOSIS — Z7409 Other reduced mobility: Secondary | ICD-10-CM | POA: Diagnosis not present

## 2024-10-09 DIAGNOSIS — S72432D Displaced fracture of medial condyle of left femur, subsequent encounter for closed fracture with routine healing: Secondary | ICD-10-CM | POA: Diagnosis not present

## 2024-10-09 DIAGNOSIS — Z4789 Encounter for other orthopedic aftercare: Secondary | ICD-10-CM | POA: Diagnosis not present

## 2024-10-09 DIAGNOSIS — M6281 Muscle weakness (generalized): Secondary | ICD-10-CM | POA: Diagnosis not present

## 2024-10-11 DIAGNOSIS — Z7409 Other reduced mobility: Secondary | ICD-10-CM | POA: Diagnosis not present

## 2024-10-11 DIAGNOSIS — S72432D Displaced fracture of medial condyle of left femur, subsequent encounter for closed fracture with routine healing: Secondary | ICD-10-CM | POA: Diagnosis not present

## 2024-10-11 DIAGNOSIS — M6281 Muscle weakness (generalized): Secondary | ICD-10-CM | POA: Diagnosis not present

## 2024-10-11 DIAGNOSIS — Z4789 Encounter for other orthopedic aftercare: Secondary | ICD-10-CM | POA: Diagnosis not present

## 2024-10-11 DIAGNOSIS — R8290 Unspecified abnormal findings in urine: Secondary | ICD-10-CM | POA: Diagnosis not present

## 2024-10-13 DIAGNOSIS — S72432D Displaced fracture of medial condyle of left femur, subsequent encounter for closed fracture with routine healing: Secondary | ICD-10-CM | POA: Diagnosis not present

## 2024-10-13 DIAGNOSIS — M25562 Pain in left knee: Secondary | ICD-10-CM | POA: Diagnosis not present

## 2024-10-13 DIAGNOSIS — R8290 Unspecified abnormal findings in urine: Secondary | ICD-10-CM | POA: Diagnosis not present

## 2024-10-13 DIAGNOSIS — Z7409 Other reduced mobility: Secondary | ICD-10-CM | POA: Diagnosis not present

## 2024-10-13 DIAGNOSIS — Z4789 Encounter for other orthopedic aftercare: Secondary | ICD-10-CM | POA: Diagnosis not present

## 2024-10-13 DIAGNOSIS — M6281 Muscle weakness (generalized): Secondary | ICD-10-CM | POA: Diagnosis not present

## 2024-10-14 DIAGNOSIS — Z7409 Other reduced mobility: Secondary | ICD-10-CM | POA: Diagnosis not present

## 2024-10-14 DIAGNOSIS — Z1612 Extended spectrum beta lactamase (ESBL) resistance: Secondary | ICD-10-CM | POA: Diagnosis not present

## 2024-10-14 DIAGNOSIS — M6281 Muscle weakness (generalized): Secondary | ICD-10-CM | POA: Diagnosis not present

## 2024-10-14 DIAGNOSIS — A499 Bacterial infection, unspecified: Secondary | ICD-10-CM | POA: Diagnosis not present

## 2024-10-14 DIAGNOSIS — N390 Urinary tract infection, site not specified: Secondary | ICD-10-CM | POA: Diagnosis not present

## 2024-10-14 DIAGNOSIS — K5900 Constipation, unspecified: Secondary | ICD-10-CM | POA: Diagnosis not present

## 2024-10-16 DIAGNOSIS — S72432D Displaced fracture of medial condyle of left femur, subsequent encounter for closed fracture with routine healing: Secondary | ICD-10-CM | POA: Diagnosis not present

## 2024-10-16 DIAGNOSIS — M109 Gout, unspecified: Secondary | ICD-10-CM | POA: Diagnosis not present

## 2024-10-16 DIAGNOSIS — Z7409 Other reduced mobility: Secondary | ICD-10-CM | POA: Diagnosis not present

## 2024-10-16 DIAGNOSIS — M25572 Pain in left ankle and joints of left foot: Secondary | ICD-10-CM | POA: Diagnosis not present

## 2024-10-16 DIAGNOSIS — Z4789 Encounter for other orthopedic aftercare: Secondary | ICD-10-CM | POA: Diagnosis not present

## 2024-10-16 DIAGNOSIS — M25562 Pain in left knee: Secondary | ICD-10-CM | POA: Diagnosis not present

## 2024-10-16 DIAGNOSIS — R55 Syncope and collapse: Secondary | ICD-10-CM | POA: Diagnosis not present

## 2024-10-16 DIAGNOSIS — M6281 Muscle weakness (generalized): Secondary | ICD-10-CM | POA: Diagnosis not present

## 2024-10-16 DIAGNOSIS — I5022 Chronic systolic (congestive) heart failure: Secondary | ICD-10-CM | POA: Diagnosis not present

## 2024-10-17 DIAGNOSIS — S72432D Displaced fracture of medial condyle of left femur, subsequent encounter for closed fracture with routine healing: Secondary | ICD-10-CM | POA: Diagnosis not present

## 2024-10-17 DIAGNOSIS — Z7409 Other reduced mobility: Secondary | ICD-10-CM | POA: Diagnosis not present

## 2024-10-17 DIAGNOSIS — M25562 Pain in left knee: Secondary | ICD-10-CM | POA: Diagnosis not present

## 2024-10-17 DIAGNOSIS — M6281 Muscle weakness (generalized): Secondary | ICD-10-CM | POA: Diagnosis not present

## 2024-10-17 DIAGNOSIS — Z4789 Encounter for other orthopedic aftercare: Secondary | ICD-10-CM | POA: Diagnosis not present

## 2024-10-17 DIAGNOSIS — N390 Urinary tract infection, site not specified: Secondary | ICD-10-CM | POA: Diagnosis not present

## 2024-10-19 DIAGNOSIS — I130 Hypertensive heart and chronic kidney disease with heart failure and stage 1 through stage 4 chronic kidney disease, or unspecified chronic kidney disease: Secondary | ICD-10-CM | POA: Diagnosis not present

## 2024-10-19 DIAGNOSIS — S31010D Laceration without foreign body of lower back and pelvis without penetration into retroperitoneum, subsequent encounter: Secondary | ICD-10-CM | POA: Diagnosis not present

## 2024-10-19 DIAGNOSIS — S72432D Displaced fracture of medial condyle of left femur, subsequent encounter for closed fracture with routine healing: Secondary | ICD-10-CM | POA: Diagnosis not present

## 2024-10-19 DIAGNOSIS — Z5189 Encounter for other specified aftercare: Secondary | ICD-10-CM | POA: Diagnosis not present

## 2024-10-19 DIAGNOSIS — Z9181 History of falling: Secondary | ICD-10-CM | POA: Diagnosis not present

## 2024-10-19 DIAGNOSIS — M7122 Synovial cyst of popliteal space [Baker], left knee: Secondary | ICD-10-CM | POA: Diagnosis not present

## 2024-10-19 DIAGNOSIS — I7090 Unspecified atherosclerosis: Secondary | ICD-10-CM | POA: Diagnosis not present

## 2024-10-19 DIAGNOSIS — E785 Hyperlipidemia, unspecified: Secondary | ICD-10-CM | POA: Diagnosis not present

## 2024-10-19 DIAGNOSIS — I5022 Chronic systolic (congestive) heart failure: Secondary | ICD-10-CM | POA: Diagnosis not present

## 2024-10-19 DIAGNOSIS — D631 Anemia in chronic kidney disease: Secondary | ICD-10-CM | POA: Diagnosis not present

## 2024-10-19 DIAGNOSIS — M109 Gout, unspecified: Secondary | ICD-10-CM | POA: Diagnosis not present

## 2024-10-19 DIAGNOSIS — M6281 Muscle weakness (generalized): Secondary | ICD-10-CM | POA: Diagnosis not present

## 2024-10-19 DIAGNOSIS — Z7984 Long term (current) use of oral hypoglycemic drugs: Secondary | ICD-10-CM | POA: Diagnosis not present

## 2024-10-19 DIAGNOSIS — I9589 Other hypotension: Secondary | ICD-10-CM | POA: Diagnosis not present

## 2024-10-19 DIAGNOSIS — N390 Urinary tract infection, site not specified: Secondary | ICD-10-CM | POA: Diagnosis not present

## 2024-10-19 DIAGNOSIS — N183 Chronic kidney disease, stage 3 unspecified: Secondary | ICD-10-CM | POA: Diagnosis not present

## 2024-10-19 DIAGNOSIS — Z96642 Presence of left artificial hip joint: Secondary | ICD-10-CM | POA: Diagnosis not present

## 2024-10-19 DIAGNOSIS — M1712 Unilateral primary osteoarthritis, left knee: Secondary | ICD-10-CM | POA: Diagnosis not present

## 2024-10-21 ENCOUNTER — Encounter (HOSPITAL_COMMUNITY)

## 2024-10-21 DIAGNOSIS — M1712 Unilateral primary osteoarthritis, left knee: Secondary | ICD-10-CM | POA: Diagnosis not present

## 2024-10-21 DIAGNOSIS — M25562 Pain in left knee: Secondary | ICD-10-CM | POA: Diagnosis not present

## 2024-10-28 LAB — LAB REPORT - SCANNED: EGFR: 78.1

## 2024-10-29 ENCOUNTER — Telehealth (HOSPITAL_COMMUNITY): Payer: Self-pay

## 2024-10-29 ENCOUNTER — Ambulatory Visit (HOSPITAL_COMMUNITY): Payer: Self-pay | Admitting: Cardiology

## 2024-10-29 DIAGNOSIS — S31000A Unspecified open wound of lower back and pelvis without penetration into retroperitoneum, initial encounter: Secondary | ICD-10-CM | POA: Diagnosis not present

## 2024-10-29 NOTE — Telephone Encounter (Signed)
 Called to confirm/remind patient of their appointment at [facility] on 10/30/24.   Appointment:   [x] Confirmed  [] Left mess   [] No answer/No voice mail  [] VM Full/unable to leave message  [] Phone not in service  Patient reminded to bring all medications and/or complete list.  Confirmed patient has transportation. Gave directions, instructed to utilize valet parking.

## 2024-10-29 NOTE — Progress Notes (Signed)
 Patient ID: Kevin Murillo, male   DOB: [birth year], 78 y.o.   MRN: [id number] PCP: Dr. Onita Cardiology: Dr. Rolan  Chief complaint: CHF  78 y.o.with history of HTN and nonischemic cardiomyopathy (diagnosed in 2011) presents for cardiology followup.  He had an echo in 1/15, showing that EF remains 25-30% with mild LV dilation.  I had him do a cardiopulmonary exercise test in 3/15 that showed normal functional capacity.  Most recent echo in 4/19 showed EF remains 25-30%.  Repeat LHC/RHC in 4/19 showed no significant coronary disease, mildly elevated LV filling pressure, and CI 2.01.   Echo in 2/20 showed EF 30-35%, normal RV. Echo in 9/21 showed EF 30% with mild LV dilation, diffuse hypokinesis.   In 12/21, he was sitting in a restaurant eating breakfast when he passed out suddenly with no prodrome. He quickly regained consciousness. He was admitted to the hospital.  He was found to have a UTI, Jardiance  was stopped.  His BP was low, ?due to urosepsis/dehydration.  He was also taken off Bidil  and spironolactone . No prior dizzy spells and none since.  Zio patch in 2/22 showed 4% PVCs, short 4 beat NSVT run.  Patient saw Dr. Fernande with EP, syncope possibly due to SGLT2 inhibitor-related UTI, Jardiance  has been stopped.   Echo in 4/23 showed EF 30% with global hypokinesis, mildly decreased RV systolic function.   Episode of hypotension with loss of consciousness in 4/24, thought to be dehydrated.  Zio monitor after this showed 1.8% PVCs, 4 short NSVT runs, 1 episode of type 1 2nd degree AVB likely while asleep.   He return for followup of CHF.  Mobility improved post-THR. Not using a cane or walker now.  No further episodes of lightheadedness.  No significant exertional dyspnea or chest pain.  No orthopnea/PND.    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 HTN  2. Left hand neuropathy  3. Nonischemic cardiomyopathy: Echo (6/11) with EF 30-35%, posteroinferior severe hypokinesis, moderate diastolic dysfunction, mild left atrial enlargement, mild LV hypertrophy. LHC (7/11): EF 30-35% with diffuse hypokinesis, no angiographic CAD. SPEP and Fe studies unremarkable, TSH normal.  Repeat echo (1/12) with EF 45%, mild global hypokinesis, mild LVH, normal RV size and systolic function.  Echo (6/14) with EF 20-25%, diffuse hypokinesis, mild LV dilation. Echo (1/15) with mild LV dilation, EF 25-30%, diffuse hypokinesis, prominent apical trabeculation, mildly dilated RV. CPX (3/15) with VO2 max 20.1, VE/VCO2 slope 34.7 (near normal when adjusted to respiratory compensation point) => this CPX showed normal functional capacity for his age.  - Echo (4/19): EF 25-30%, mild MR.  - RHC/LHC (4/19): No significant CAD.  Mean RA 6, PA 46/18 mean 32, mean PCWP 21, CI 2.01, PVR 2.4 WU (pulmonary venous hypertension).  - Echo (2/20): EF 30-35%, moderate diastolic dysfunction, normal RV size and systolic function, moderate LAE.  - Echo (9/21): EF 30%, mild LV dilation, diffuse hypokinesis, normal RV.  - Echo (4/23): EF 30% with global hypokinesis, mildly decreased RV systolic function. 4. Hyperlipidemia  5. Syncope (12/21, 4/24): No prodrome. Suspect dehydrated/orthostatic.  - Zio patch in 2/22 showed 4% PVCs, short 4 beat NSVT run. - Zio monitor (5/24): 1.8% PVCs, 4  short NSVT runs, 1 episode of type 1 2nd degree AVB likely while asleep.  6. OA hips: S/p right THR.   Family History:  Mother died with CHF at 70.   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  Quit smoking in 1996.  Occasional ETOH  Married with 3  children. Wife is now in dementia unit at [facility].    ROS: All systems reviewed and negative except as per HPI.   Current Outpatient Medications  Medication Sig Dispense Refill   acetaminophen  (TYLENOL ) 500 MG tablet Take 1,000 mg by mouth every 6 (six) hours as needed for headache (pain).     allopurinol  (ZYLOPRIM ) 100 MG tablet Take 1 tablet (100 mg total) by mouth daily.     colchicine  0.6 MG tablet Take 1 tablet (0.6 mg total) by mouth daily. 5 tablet 0   dapagliflozin  propanediol (FARXIGA ) 10 MG TABS tablet Take 1 tablet (10 mg total) by mouth daily before breakfast. (Patient taking differently: Take 10 mg by mouth daily.) 30 tablet 11   Multiple Vitamins-Minerals (CENTRUM SILVER MEN 50+) TABS Take 1 tablet by mouth daily.     simvastatin  (ZOCOR ) 40 MG tablet Take 40 mg by mouth daily.      No current facility-administered medications for this visit.   There were no vitals taken for this visit. General: NAD Neck: No JVD, no thyromegaly or thyroid  nodule.  Lungs: Clear to auscultation bilaterally with normal respiratory effort. CV: Nondisplaced PMI.  Heart regular S1/S2, no S3/S4, no murmur.  No peripheral edema.  No carotid bruit.  Normal pedal pulses.  Abdomen: Soft, nontender, no hepatosplenomegaly, no distention.  Skin: Intact without lesions or rashes.  Neurologic: Alert and oriented x 3.  Psych: Normal affect. Extremities: No clubbing or cyanosis.  HEENT: Normal.   Assessment/Plan: 1. Nonischemic cardiomyopathy: Possibly due to viral myocarditis versus HTN.  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 Echo in 4/19 showed EF 25-30%.  No CAD on 4/19 cath, but CI marginal at 2.01 with mildly elevated PCWP.  Echo in 9/21 showed EF fairly stable at 30%.  Echo in 4/23 showed EF 30% with global hypokinesis, mildly decreased RV systolic function. NYHA class I-II, not volume overloaded on exam.  - He saw Dr. Fernande to discuss ICD  placement post-syncopal episode.  Syncope likely related to orthostasis rather than arrhythmia.  He decided against ICD placement.    - I will arrange for repeat echo.  - Continue Coreg  25 mg bid.  - Continue spironolactone  25 mg daily.  - Continue Bidil  1 tab tid, no BP room to increase.   - Continue Entresto  97/103 bid.   - Stop Lasix  and KCl, start Farxiga  10 mg daily.  BMET/BNP today and BMET in 10 days.  2. HTN: BP controlled (actually low now).  3. Hyperlipidemia: Check lipids today.  4. Syncope: 2 episodes, most recently in 4/24.  Suspect orthostatic/dehydration.  Zio monitor in 5/24 without definite cause.  - As above, has seen EP and decided against ICD placement.   Followup with APP in 6 months  I spent 31 minutes reviewing records, interviewing/examining patient, and managing orders.   Harlene HERO [facility] 10/29/2024

## 2024-10-30 ENCOUNTER — Ambulatory Visit (HOSPITAL_COMMUNITY)
Admission: RE | Admit: 2024-10-30 | Discharge: 2024-10-30 | Disposition: A | Source: Ambulatory Visit | Attending: Family Medicine

## 2024-10-30 ENCOUNTER — Encounter (HOSPITAL_COMMUNITY): Payer: Self-pay

## 2024-10-30 ENCOUNTER — Other Ambulatory Visit (HOSPITAL_COMMUNITY): Payer: Self-pay | Admitting: Family Medicine

## 2024-10-30 ENCOUNTER — Ambulatory Visit (HOSPITAL_COMMUNITY): Payer: Self-pay | Admitting: Family Medicine

## 2024-10-30 ENCOUNTER — Ambulatory Visit (HOSPITAL_COMMUNITY)
Admission: RE | Admit: 2024-10-30 | Discharge: 2024-10-30 | Disposition: A | Source: Ambulatory Visit | Attending: Family Medicine | Admitting: Family Medicine

## 2024-10-30 VITALS — BP 122/84 | HR 84 | Ht 69.0 in | Wt 229.4 lb

## 2024-10-30 DIAGNOSIS — Z87891 Personal history of nicotine dependence: Secondary | ICD-10-CM | POA: Diagnosis not present

## 2024-10-30 DIAGNOSIS — Z96641 Presence of right artificial hip joint: Secondary | ICD-10-CM | POA: Diagnosis not present

## 2024-10-30 DIAGNOSIS — R55 Syncope and collapse: Secondary | ICD-10-CM | POA: Diagnosis not present

## 2024-10-30 DIAGNOSIS — I428 Other cardiomyopathies: Secondary | ICD-10-CM | POA: Diagnosis not present

## 2024-10-30 DIAGNOSIS — I5020 Unspecified systolic (congestive) heart failure: Secondary | ICD-10-CM | POA: Diagnosis not present

## 2024-10-30 DIAGNOSIS — I493 Ventricular premature depolarization: Secondary | ICD-10-CM

## 2024-10-30 DIAGNOSIS — I110 Hypertensive heart disease with heart failure: Secondary | ICD-10-CM | POA: Diagnosis not present

## 2024-10-30 DIAGNOSIS — Z79899 Other long term (current) drug therapy: Secondary | ICD-10-CM | POA: Diagnosis not present

## 2024-10-30 DIAGNOSIS — E785 Hyperlipidemia, unspecified: Secondary | ICD-10-CM | POA: Diagnosis not present

## 2024-10-30 DIAGNOSIS — I10 Essential (primary) hypertension: Secondary | ICD-10-CM | POA: Diagnosis not present

## 2024-10-30 MED ORDER — SPIRONOLACTONE 25 MG PO TABS: 25.0000 mg | ORAL_TABLET | Freq: Every day | ORAL | 3 refills | Status: AC

## 2024-10-30 MED ORDER — CARVEDILOL 3.125 MG PO TABS: 3.1250 mg | ORAL_TABLET | Freq: Two times a day (BID) | ORAL | 1 refills | Status: AC

## 2024-10-30 NOTE — Patient Instructions (Addendum)
 Good to see you today!   RESTART coreg  3.125 mg Twice daily  RESTART Spironolactone   25 mg daily  Lab work in 1 week as scheduled  Your physician has requested that you have an echocardiogram. Echocardiography is a painless test that uses sound waves to create images of your heart. It provides your doctor with information about the size and shape of your heart and how well your heart's chambers and valves are working. This procedure takes approximately one hour. There are no restrictions for this procedure. Please do NOT wear cologne, perfume, aftershave, or lotions (deodorant is allowed). Please arrive 15 minutes prior to your appointment time.  Please note: We ask at that you not bring children with you during ultrasound (echo/ vascular) testing. Due to room size and safety concerns, children are not allowed in the ultrasound rooms during exams. Our front office staff cannot provide observation of children in our lobby area while testing is being conducted. An adult accompanying a patient to their appointment will only be allowed in the ultrasound room at the discretion of the ultrasound technician under special circumstances. We apologize for any inconvenience.  Your provider has recommended that  you wear a Zio Patch for 14 days.  This monitor will record your heart rhythm for our review.  IF you have any symptoms while wearing the monitor please press the button.  If you have any issues with the patch or you notice a red or orange light on it please call the company at [phone].  Once you remove the patch please mail it back to the company as soon as possible so we can get the results.  Your physician recommends that you schedule a follow-up appointment 6 months with echocardiogram(June) Call office in April to schedule an appointment  If you have any questions or concerns before your next appointment please send us  a message through [facility] or call our office at [phone].    TO  LEAVE A MESSAGE FOR THE NURSE SELECT OPTION 2, PLEASE LEAVE A MESSAGE INCLUDING: YOUR NAME DATE OF BIRTH CALL BACK NUMBER REASON FOR CALL**this is important as we prioritize the call backs  YOU WILL RECEIVE A CALL BACK THE SAME DAY AS LONG AS YOU CALL BEFORE 4:00 PM At the [facility], you and your health needs are our priority. As part of our continuing mission to provide you with exceptional heart care, we have created designated Provider Care Teams. These Care Teams include your primary Cardiologist (physician) and Advanced Practice Providers (APPs- Physician Assistants and Nurse Practitioners) who all work together to provide you with the care you need, when you need it.   You may see any of the following providers on your designated Care Team at your next follow up: Dr Toribio Fuel Dr Ezra Shuck Dr. Morene Brownie Greig Mosses, NP Caffie Shed, [place] [facility] [place], [place] Beckey Coe, NP Jordan Lee, NP Ellouise Class, NP Tinnie Redman, PharmD Jaun Bash, PharmD   Please be sure to bring in all your medications bottles to every appointment.    Thank you for choosing [facility]

## 2024-11-06 ENCOUNTER — Ambulatory Visit (HOSPITAL_COMMUNITY): Admission: RE | Admit: 2024-11-06 | Discharge: 2024-11-06 | Attending: Cardiology

## 2024-11-06 DIAGNOSIS — I5020 Unspecified systolic (congestive) heart failure: Secondary | ICD-10-CM | POA: Insufficient documentation

## 2024-11-06 LAB — BASIC METABOLIC PANEL WITH GFR
Anion gap: 9 (ref 5–15)
BUN: 14 mg/dL (ref 8–23)
CO2: 26 mmol/L (ref 22–32)
Calcium: 9.4 mg/dL (ref 8.9–10.3)
Chloride: 103 mmol/L (ref 98–111)
Creatinine, Ser: 1.24 mg/dL (ref 0.61–1.24)
GFR, Estimated: 59 mL/min — ABNORMAL LOW (ref >60)
Glucose, Bld: 89 mg/dL (ref 70–99)
Potassium: 4 mmol/L (ref 3.5–5.1)
Sodium: 138 mmol/L (ref 135–145)

## 2024-11-12 ENCOUNTER — Telehealth (HOSPITAL_COMMUNITY): Payer: Self-pay | Admitting: Cardiology

## 2024-11-12 MED ORDER — FUROSEMIDE 20 MG PO TABS: 40.0000 mg | ORAL_TABLET | ORAL | 11 refills | Status: AC | PRN

## 2024-11-12 MED ORDER — POTASSIUM CHLORIDE CRYS ER 20 MEQ PO TBCR: 20.0000 meq | EXTENDED_RELEASE_TABLET | ORAL | 11 refills | Status: AC | PRN

## 2024-11-12 NOTE — Telephone Encounter (Signed)
 HHRN called to report  Today weight 224 Last week 220  Denies symptoms No swelling or SOB Denies missed medications No changes to diet or fluid intake   Please advise if changes are needed

## 2024-11-12 NOTE — Telephone Encounter (Signed)
 Pt aware.

## 2024-11-25 NOTE — Addendum Note (Signed)
 Encounter addended by: Debarah Garrison MATSU, RN on: 11/25/2024 9:54 AM  Actions taken: Imaging Exam ended

## 2024-11-27 ENCOUNTER — Ambulatory Visit (HOSPITAL_COMMUNITY): Payer: Self-pay | Admitting: Family Medicine

## 2024-11-27 DIAGNOSIS — I493 Ventricular premature depolarization: Secondary | ICD-10-CM

## 2024-12-10 MED ORDER — AMIODARONE HCL 200 MG PO TABS: ORAL_TABLET | ORAL | 2 refills | Status: AC
